# Patient Record
Sex: Female | Born: 1970 | Race: White | Hispanic: No | Smoking: Former smoker
Health system: Southern US, Community
[De-identification: ages and names within clinical notes are randomized; demographics above are authoritative.]

## PROBLEM LIST (undated history)

## (undated) DIAGNOSIS — G709 Myoneural disorder, unspecified: Secondary | ICD-10-CM

## (undated) DIAGNOSIS — F329 Major depressive disorder, single episode, unspecified: Secondary | ICD-10-CM

## (undated) DIAGNOSIS — F419 Anxiety disorder, unspecified: Secondary | ICD-10-CM

## (undated) DIAGNOSIS — Z889 Allergy status to unspecified drugs, medicaments and biological substances status: Secondary | ICD-10-CM

## (undated) DIAGNOSIS — M790 Rheumatism, unspecified: Secondary | ICD-10-CM

## (undated) DIAGNOSIS — T8859XA Other complications of anesthesia, initial encounter: Secondary | ICD-10-CM

## (undated) DIAGNOSIS — F32A Depression, unspecified: Secondary | ICD-10-CM

## (undated) DIAGNOSIS — T4145XA Adverse effect of unspecified anesthetic, initial encounter: Secondary | ICD-10-CM

## (undated) DIAGNOSIS — J45909 Unspecified asthma, uncomplicated: Secondary | ICD-10-CM

## (undated) DIAGNOSIS — M1990 Unspecified osteoarthritis, unspecified site: Secondary | ICD-10-CM

## (undated) DIAGNOSIS — S32009A Unspecified fracture of unspecified lumbar vertebra, initial encounter for closed fracture: Secondary | ICD-10-CM

## (undated) DIAGNOSIS — G43909 Migraine, unspecified, not intractable, without status migrainosus: Secondary | ICD-10-CM

## (undated) DIAGNOSIS — G35 Multiple sclerosis: Secondary | ICD-10-CM

## (undated) DIAGNOSIS — N838 Other noninflammatory disorders of ovary, fallopian tube and broad ligament: Secondary | ICD-10-CM

## (undated) DIAGNOSIS — K529 Noninfective gastroenteritis and colitis, unspecified: Secondary | ICD-10-CM

## (undated) DIAGNOSIS — G8929 Other chronic pain: Secondary | ICD-10-CM

## (undated) DIAGNOSIS — M797 Fibromyalgia: Secondary | ICD-10-CM

## (undated) DIAGNOSIS — B999 Unspecified infectious disease: Secondary | ICD-10-CM

## (undated) DIAGNOSIS — E039 Hypothyroidism, unspecified: Secondary | ICD-10-CM

## (undated) HISTORY — PX: DILATION AND CURETTAGE OF UTERUS: SHX78

## (undated) HISTORY — DX: Unspecified infectious disease: B99.9

## (undated) HISTORY — PX: BUNIONECTOMY: SHX129

## (undated) HISTORY — PX: OVARY SURGERY: SHX727

## (undated) HISTORY — PX: FOOT NEUROMA SURGERY: SHX646

## (undated) HISTORY — DX: Unspecified fracture of unspecified lumbar vertebra, initial encounter for closed fracture: S32.009A

## (undated) HISTORY — PX: TONSILLECTOMY: SUR1361

## (undated) HISTORY — DX: Major depressive disorder, single episode, unspecified: F32.9

## (undated) HISTORY — DX: Noninfective gastroenteritis and colitis, unspecified: K52.9

## (undated) HISTORY — DX: Anxiety disorder, unspecified: F41.9

## (undated) HISTORY — DX: Multiple sclerosis: G35

## (undated) HISTORY — DX: Migraine, unspecified, not intractable, without status migrainosus: G43.909

## (undated) HISTORY — PX: ELBOW ARTHROSCOPY: SUR87

## (undated) HISTORY — PX: MYOMECTOMY ABDOMINAL APPROACH: SUR870

## (undated) HISTORY — DX: Depression, unspecified: F32.A

---

## 1998-05-14 HISTORY — PX: ROTATOR CUFF REPAIR: SHX139

## 1999-06-05 ENCOUNTER — Other Ambulatory Visit: Admission: RE | Admit: 1999-06-05 | Discharge: 1999-06-05 | Payer: Self-pay | Admitting: Orthopedic Surgery

## 2003-06-18 ENCOUNTER — Encounter: Admission: RE | Admit: 2003-06-18 | Discharge: 2003-06-18 | Payer: Self-pay | Admitting: Obstetrics and Gynecology

## 2003-09-24 ENCOUNTER — Inpatient Hospital Stay (HOSPITAL_COMMUNITY): Admission: AD | Admit: 2003-09-24 | Discharge: 2003-09-24 | Payer: Self-pay | Admitting: Obstetrics and Gynecology

## 2003-10-17 ENCOUNTER — Ambulatory Visit (HOSPITAL_COMMUNITY): Admission: RE | Admit: 2003-10-17 | Discharge: 2003-10-17 | Payer: Self-pay | Admitting: Sports Medicine

## 2003-11-08 ENCOUNTER — Encounter: Admission: RE | Admit: 2003-11-08 | Discharge: 2003-11-08 | Payer: Self-pay | Admitting: Sports Medicine

## 2003-12-06 ENCOUNTER — Encounter (INDEPENDENT_AMBULATORY_CARE_PROVIDER_SITE_OTHER): Payer: Self-pay | Admitting: Specialist

## 2003-12-06 ENCOUNTER — Ambulatory Visit (HOSPITAL_COMMUNITY): Admission: RE | Admit: 2003-12-06 | Discharge: 2003-12-06 | Payer: Self-pay | Admitting: Obstetrics and Gynecology

## 2003-12-10 ENCOUNTER — Inpatient Hospital Stay (HOSPITAL_COMMUNITY): Admission: AD | Admit: 2003-12-10 | Discharge: 2003-12-13 | Payer: Self-pay | Admitting: Obstetrics and Gynecology

## 2004-01-18 ENCOUNTER — Encounter: Admission: RE | Admit: 2004-01-18 | Discharge: 2004-01-18 | Payer: Self-pay | Admitting: *Deleted

## 2004-05-14 HISTORY — PX: WRIST SURGERY: SHX841

## 2006-06-06 ENCOUNTER — Other Ambulatory Visit: Admission: RE | Admit: 2006-06-06 | Discharge: 2006-06-06 | Payer: Self-pay | Admitting: *Deleted

## 2006-08-14 ENCOUNTER — Ambulatory Visit (HOSPITAL_BASED_OUTPATIENT_CLINIC_OR_DEPARTMENT_OTHER): Admission: RE | Admit: 2006-08-14 | Discharge: 2006-08-14 | Payer: Self-pay | Admitting: Orthopedic Surgery

## 2006-12-04 ENCOUNTER — Ambulatory Visit (HOSPITAL_COMMUNITY): Admission: RE | Admit: 2006-12-04 | Discharge: 2006-12-04 | Payer: Self-pay | Admitting: *Deleted

## 2007-01-02 ENCOUNTER — Ambulatory Visit (HOSPITAL_COMMUNITY): Admission: RE | Admit: 2007-01-02 | Discharge: 2007-01-03 | Payer: Self-pay | Admitting: *Deleted

## 2007-01-02 ENCOUNTER — Encounter (INDEPENDENT_AMBULATORY_CARE_PROVIDER_SITE_OTHER): Payer: Self-pay | Admitting: *Deleted

## 2007-01-11 ENCOUNTER — Observation Stay (HOSPITAL_COMMUNITY): Admission: AD | Admit: 2007-01-11 | Discharge: 2007-01-12 | Payer: Self-pay | Admitting: *Deleted

## 2007-01-13 ENCOUNTER — Inpatient Hospital Stay (HOSPITAL_COMMUNITY): Admission: AD | Admit: 2007-01-13 | Discharge: 2007-01-15 | Payer: Self-pay | Admitting: Family Medicine

## 2007-08-06 ENCOUNTER — Ambulatory Visit (HOSPITAL_BASED_OUTPATIENT_CLINIC_OR_DEPARTMENT_OTHER): Admission: RE | Admit: 2007-08-06 | Discharge: 2007-08-06 | Payer: Self-pay | Admitting: Orthopedic Surgery

## 2007-12-31 ENCOUNTER — Other Ambulatory Visit: Admission: RE | Admit: 2007-12-31 | Discharge: 2007-12-31 | Payer: Self-pay | Admitting: Gynecology

## 2008-02-27 ENCOUNTER — Encounter: Admission: RE | Admit: 2008-02-27 | Discharge: 2008-02-27 | Payer: Self-pay | Admitting: Sports Medicine

## 2009-02-28 ENCOUNTER — Encounter: Admission: RE | Admit: 2009-02-28 | Discharge: 2009-04-20 | Payer: Self-pay | Admitting: Diagnostic Neuroimaging

## 2009-04-12 ENCOUNTER — Encounter: Admission: RE | Admit: 2009-04-12 | Discharge: 2009-04-12 | Payer: Self-pay | Admitting: Sports Medicine

## 2010-05-14 HISTORY — PX: COLONOSCOPY W/ POLYPECTOMY: SHX1380

## 2010-09-26 NOTE — Op Note (Signed)
NAMESACOYA, MCGOURTY               ACCOUNT NO.:  [id number]   MEDICAL RECORD NO.:  [id number]          PATIENT TYPE:  AMB   LOCATION:  DSC                          FACILITY:  [facility]   PHYSICIAN:  Cindee Salt, [name]       DATE OF BIRTH:  [birth year]   DATE OF PROCEDURE:  08/06/2007  DATE OF DISCHARGE:                               OPERATIVE REPORT   PREOPERATIVE DIAGNOSIS:  Status post screw fixation AccuTrak standard  size, right scaphoid.   POSTOPERATIVE DIAGNOSIS:  Status post screw fixation AccuTrak standard  size, right scaphoid.   OPERATION:  Removal AccuTrak screw with placement of an infused bone  graft, right scaphoid.   SURGEON:  Cindee Salt, [name]   ASSISTANT:  Carolyne Fiscal R.N.   ANESTHESIA:  General.   ANESTHESIOLOGIST:  Sheldon Silvan, [name]   HISTORY:  The patient is a 40 year old female status post open reduction  and internal fixation of a scaphoid fracture.  She has had continued  pain primarily on the volar aspect.  CT scan reveals that the screw  appears to be slightly proud. She is admitted for removal and bone  grafting.  She is aware of the risks and complications including  infection, recurrence, injury to arteries, nerves, and tendons,  incomplete relief of symptoms, dystrophy, refracture of the fracture and  scaphoid.  In the preoperative area, the patient is seen, questions  encouraged and answered, the extremity marked by both the patient and  surgeon, antibiotic given.   PROCEDURE:  The patient is brought to the operating room where a general  anesthetic was carried out without difficulty using LMA.  She was  prepped using DuraPrep, supine position, right arm free.  After a time  out, the limb was exsanguinated with an Esmarch bandage.  A tourniquet  placed high on the arm was inflated to 250 mmHg. No latex was used due  to a latex allergy. A longitudinal incision was made curved slightly  radial at the level of the distal pole of the scaphoid, carried down  through subcutaneous tissue.  Bleeders were electrocauterized. The  dissection was carefully carried down to the tip of the scaphoid after  localization of the screw with OEC image intensification. The capsule of  the STT joint was opened with sharp dissection.  The screw was  immediately apparent.  This was proud by 2-3 threads.  The area was  minimally debrided.  A standard size screwdriver was then inserted and  the screw easily removed.  The area was then irrigated.  This was probed  with a House curette. Removal of the screw and placement of the House  curette was confirmed with the OEC.  1.7 mL Infuse bone graft was then  mixed.  This was allowed to sit for 15 minutes.  This was then cut into  two segments.  These were each rolled and, after irrigation of the  extract screw hole, these were inserted entirely filling the cavity of  the extract screw in the scaphoid.  A 25 gauge needle was then inserted  down the shaft of the scaphoid.  Its  position confirmed that the bone  graft was actually down the center of the scaphoid.  This was done in AP  and lateral directions using the [facility] image intensifier.  The capsule was  then closed with figure-of-eight 4-0 Vicryl sutures, the subcutaneous  tissues with interrupted 4-0 Vicryl, and the skin with interrupted 4-0  Vicryl Rapide sutures.  A sterile compressive dressing and  thumb spica splint was applied.  The patient tolerated the procedure  well and was taken to the recovery room for observation in satisfactory  condition.  She will be discharged home to return to the [facility] of  [place] in one week on Vicodin.           ______________________________  Cindee Salt, [name]     GK/MEDQ  D:  08/06/2007  T:  08/06/2007  Job:  [id number]   cc:   Loreta Ave, [name]

## 2010-09-26 NOTE — Discharge Summary (Signed)
[name], ALKINS               ACCOUNT NO.:  [id number]   MEDICAL RECORD NO.:  [id number]          PATIENT TYPE:  INP   LOCATION:  9305                          [facility]   PHYSICIAN:  Almedia Balls. Fore, [name]   DATE OF BIRTH:  01-May-1971   DATE OF ADMISSION:  01/13/2007  DATE OF DISCHARGE:  01/15/2007                               DISCHARGE SUMMARY   HISTORY:  The patient is a 40 year old who had undergone abdominal  myomectomy on January 02, 2007.  She was readmitted because of persistent  refractory nausea and vomiting and pain on January 13, 2007 by Dr.  Artist Pais.   LABORATORY DATA INCLUDE:  Admission CBC with hemoglobin 13.4 grams,  13,600 white blood cells with left shift.  BMET panel was within normal  limits.  Subsequent CBC was done on January 14, 2007 with hemoglobin  11.6 grams, 9500 white blood cells with normal differential.  Urinalysis  showed greater than 80 mg/dL of ketones but otherwise was negative; and  urine culture was negative   HOSPITAL COURSE:  The patient was admitted and made n.p.o. and placed on  IV fluids.  She underwent several radiologic studies including flat and  upright abdominal films, showing no bowel obstruction, renal tract  ultrasound showing normal sonographic appearance of the kidneys with no  evidence of hydronephrosis and a 5 cm cyst noted in the right hepatic  lobe.  CT of the abdomen with contrast was also done which showed a  stable-to-slight increase in size of a simple appearing cystic mass in  the lower right lobe liver.  Stable bilateral renal cysts, and 3  punctate, left, renal stones.  There was also in the pelvis a defect in  the anterior surface of the uterus corresponding to the myomectomy  incision with a question of dehiscence in this area.  Further evaluation  by several radiologists revealed that this was probable blood and  healing tissue in this area.  There was a 5.6 complex cystic ovarian  mass on the left,  felt to be a corpus luteum.  She had also a linear  density along the ventral surface of the cervix which was compatible  with a stone and a known ureteral diverticulum.   HOSPITAL COURSE:  The patient was admitted on September 1 and placed on  IV fluids as noted above.  An NG-tube was also placed later that day on  September 1, because of persistent nausea and vomiting.  This was  clamped on September 2; and the patient was observed with p.o. intake.  The NG-tube was discontinued on the morning of September 3 with full  liquids being administered without nausea or vomiting.  On the evening  of September 3, the patient was afebrile and experiencing only pain; and  was without nausea and vomiting.  It was felt that she could be  discharged at this time.   FINAL DIAGNOSES:  1. Status post myomectomy.  2. Persisting refractory nausea and vomiting and abdominal pain.   OPERATIONS:  None.   PATHOLOGY REPORT:  None.   DISPOSITION:  Discharged home  to return to the office in approximately 2  weeks for followup.  She was instructed to gradually progress her  activities over several weeks at home; and to limit lifting and driving  for several more days.  She was to progress her diet, slowly; and is to  call if unusual pain, nausea, vomiting, or if fever should ensue.  She was fully ambulatory, on a full liquid-to-regular diet and in good  condition at the time of discharge.  She was given prescriptions for  Dilaudid 2 mg #30 to be taken one-half-to-one  q.6 h. p.r.n. pain and  Reglan, generic, 10 mg #30 to be taken one t.i.d.           ______________________________  Almedia Balls. [name] Patient, [name]     SRF/MEDQ  D:  01/15/2007  T:  01/15/2007  Job:  [id number]

## 2010-09-26 NOTE — Op Note (Signed)
[name], [name]               ACCOUNT NO.:  [id number]   MEDICAL RECORD NO.:  [id number]          PATIENT TYPE:  OIB   LOCATION:  9312                          [facility]   PHYSICIAN:  Almedia Balls. Fore, [name]   DATE OF BIRTH:  1970-12-15   DATE OF PROCEDURE:  01/02/2007  DATE OF DISCHARGE:  01/03/2007                               OPERATIVE REPORT   NOTE:  This had been dictated on 01/02/07 but I found that it did not  get recorded properly so I am redictating.   PREOPERATIVE DIAGNOSES:  Abnormal uterine bleeding, myometrial and  intracavitary myomata.   POSTOPERATIVE DIAGNOSIS:  Abnormal uterine bleeding, myometrial and  intracavitary myomata, pending pathology.   OPERATION PERFORMED:  Abdominal myomectomy and lysis of adhesions.   ANESTHESIA:  General orotracheal.   OPERATOR:  [name], [name]   FIRST ASSISTANT:  Beather Arbour, [name]   INDICATION FOR SURGERY:  The patient is a 40 year old female who is  status post a prior hysteroscopic attempt at myomectomy by another  physician which resulted in regrowth of the myoma in an intracavitary  position, also submucosal and subserosal myomata present with abnormal  uterine bleeding and pelvic pain, status post right salpingo-  oophorectomy.   The patient has been apprised of the need for surgery to treat these  problems and the type of surgery to be performed to include risks of  anesthesia, injury to uterus, tubes, ovaries, bowel, bladder, blood  vessels, ureters, postoperative hemorrhage, infection, and recuperation.  She fully understands all of these considerations and has signed  informed consent to proceed on 01/02/07.   OPERATIVE FINDINGS:  On entry into the abdomen, the uterus was enlarged  to approximately ten weeks gestational size.  There were several myomata  present within the myometrial area and another myoma in the endometrial  cavity.  Exploration of the upper abdomen revealed the lower liver edge  area, the gallbladder, spleen, periaortic areas, kidneys, and appendix  to be normal to visualization and/or palpation.  In the pelvis further  examination revealed absence of the right tube and ovary from previous  surgery.  The left tube and ovary were adherent to each other and to the  lateral peritoneal surface.   DESCRIPTION OF PROCEDURE:  With the patient under general anesthesia,  prepared and draped in the usual sterile fashion, with a Foley catheter  in the bladder, and a catheter having been placed in the cervix for  injection of Indigo Carmine, a lower abdominal transverse incision was  made and was carried into the peritoneal cavity without difficulty.  The  vagina had been packed so that the uterus was elevated somewhat but a  self-retaining retractor was placed, and the bowel was packed off and  packs were placed in the posterior cul-de-sac for further elevation of  the uterus.  The anterior serosa of the uterus was then incised and  taken down with the bladder as a lower fundal incision would be  necessary for entrance into the endometrial cavity to remove this  fibroid as well as to remove the myometrial fibroids.  This was  accomplished with numerous fibroids  removed.  The defects in the  myometrium were reapproximated and rendered hemostatic with imbricating  sutures of zero Vicryl.  The upper layer of the uterus was  reapproximated with imbricating sutures of 2-0 PDS.  The incision in the  serosa was then reapproximated with continuous suture of 2-0 PDS.   The adhesions involving the left tube and ovary were then lysed using  sharp dissection and Bovie dissection as well with freeing of the left  fimbria from the ovary and from the posterolateral peritoneum.  This  allowed for better elevation of the adnexa out of the posterior cul-de-  sac.  The area was lavaged with copious amounts of lactated Ringer  solution, and after noting that hemostasis was maintained and  that  sponge and instrument counts were correct, the peritoneum was closed  with a continuous suture of zero Vicryl.  The fascia was closed with two  sutures of zero PDS which were brought from the lateral aspects of the  incision and tied in the midline.  The subcutaneous fat was  reapproximated with interrupted horizontal mattress sutures of zero  Vicryl.  The skin was closed with a subcuticular suture of 3-0 plain  catgut.   The patient was then examined vaginally, and because of the inability to  totally render the intracavitary area hemostatic, the pediatric catheter  was inserted fully into the uterine cavity with the balloon inflated  with approximately 2 ml of saline.  This was to provide tamponade of  small vessels in this area and to allow healing around the balloon.   At this point with good hemostasis throughout and correct sponge and  instrument count, the procedure was terminated.  Estimated blood loss  was 300 ml.  The patient was taken to the recovery room in good  condition with clear urine in the Foley catheter tubing.  She will be  placed on 23 hour observation following surgery.           ______________________________  Almedia Balls [name], [name]     SRF/MEDQ  D:  01/14/2007  T:  01/14/2007  Job:  [id number]   cc:   Almedia Balls. [name], [name]  Fax: [phone]   Gretta Cool, [name]  Fax: [phone]

## 2010-09-26 NOTE — H&P (Signed)
[name], [name]               ACCOUNT NO.:  [id number]   MEDICAL RECORD NO.:  [id number]          PATIENT TYPE:  AMB   LOCATION:  SDC                           [facility]   PHYSICIAN:  Almedia Balls. Fore, [name]   DATE OF BIRTH:  [birth year]   DATE OF ADMISSION:  01/02/2007  DATE OF DISCHARGE:                              HISTORY & PHYSICAL   HISTORY OF PRESENT ILLNESS:  The patient is a 40 year old gravida 1,  para 0 whose last menstrual period was December 23, 2006.  She has  undergone in the past laparoscopy in 1985 with repeat removal of right  ovary and tube.  She was also operated on in the summer of 2005 at which  time a hysteroscopy was done with removal of possible fibroid from her  uterus as well as a D&C.  She had a stormy postoperative course  following this in that she was in extreme pain and temperature elevation  over several weeks.  This eventually resolved.  She has had increasing  pain over the past several months and increasingly severe menses in  terms of flow.  Hysterogram was performed in late July 2008 with finding  of irregular mass within the endometrial cavity which represented a  probable submucosal fibroid.  She is admitted at this time for abdominal  myomectomy and evaluation of tube and ovary otherwise.  She has been  fully counseled as to the nature of the procedure and the risks involved  including risks of anesthesia, injury to uterus, tubes, ovaries, bowel,  bladder, blood vessels, ureters, postoperative hemorrhage, infection,  recuperation.  She fully understands all these considerations and wishes  to proceed on 02 January 2007.  Pap smear was normal in January 2008.   PAST MEDICAL HISTORY:  Includes laparoscopy with findings of adhesions  and a D&C in several times during the period of 1985, 1989, right  oophorectomy in 1988. She has had several surgeries on her feet right  and left and right shoulder surgery in 2003. She underwent a  hysteroscopy  D&C and removal of myoma in July 2005 and had repeat  hysteroscopy D&C in September 2005 for evaluation of this area.  She has  recently had extreme pain as noted with periods and has been taking  Hydrocodone and oxycodone for menstrual pain.  She is allergic to no  medications.  She is a nonsmoker at this point although she has smoked  in the past.   FAMILY HISTORY:  Includes mother with cardiovascular disease.  Father  with cancer of unknown type.   REVIEW OF SYSTEMS:  HEENT: Headaches mainly with menses.  CARDIORESPIRATORY: Negative.  GASTROINTESTINAL:  Negative.  GENITOURINARY:  As in present illness.  NEUROMUSCULAR:  Negative.   PHYSICAL EXAMINATION:  VITAL SIGNS:  Height 5 feet 7 inches, weight 147  pounds, blood pressure 122/68, pulse 80, respirations 18.  GENERAL:  Well-developed white female in no acute distress.  HEENT: Within normal limits.  NECK: Supple without masses, adenopathy or bruits.  Heart: Regular rate and rhythm without murmurs.  LUNGS: Clear to P&A.  BREASTS:  Examining examined sitting and lying without mass.  Axilla  negative.  ABDOMEN:  Flat and soft without mass, nontender.  PELVIC:  External genitalia, Bartholin's, urethra and Skene's glands  within normal limits.  Vagina is normal.  Cervix is slightly inflamed.  Uterus is mid posterior approximately 8-[redacted] weeks gestational size,  somewhat irregular, slightly tender on palpation.  Adnexa reveal no  palpable mass. Anterior posterior cul-de-sac exam is confirmatory.  EXTREMITIES:  Within normal limits.  CENTRAL NERVOUS SYSTEM: Grossly intact.  SKIN: Normal except for multiple tattoos over her back, right arm, right  hip, right lower quadrant.   IMPRESSION:  Leiomyomata uteri enlarging, pelvic pain, abnormal uterine  bleeding secondary to above.   DISPOSITION:  As noted above.           ______________________________  Almedia Balls. [name], [name]     SRF/MEDQ  D:  12/25/2006  T:  12/26/2006  Job:   [id number]

## 2010-09-26 NOTE — Op Note (Signed)
[name], [name]               ACCOUNT NO.:  [id number]   MEDICAL RECORD NO.:  [id number]          PATIENT TYPE:  OIB   LOCATION:  9312                          [facility]   PHYSICIAN:  Almedia Balls. Fore, [name]   DATE OF BIRTH:  June 27, 1970   DATE OF PROCEDURE:  01/02/2007  DATE OF DISCHARGE:                               OPERATIVE REPORT   PREOPERATIVE DIAGNOSIS:  Leiomyomata uteri, uterine enlargement,  abnormal uterine bleeding, pelvic pain.   POSTOPERATIVE DIAGNOSIS:  Leiomyomata uteri, uterine enlargement,  abnormal uterine bleeding, pelvic pain.  Pending pathology.  Plus  ovarian and tubal adhesions - left.   OPERATION:  Abdominal myomectomy, ovarian and salpingolysis - left,  revision of keloid.   ANESTHESIA:  General orotracheal.   OPERATOR:  Almedia Balls. [name], [name]   FIRST ASSISTANT:  Dr. Nicholas Lose.   INDICATIONS FOR SURGERY:  The patient is a 40 year old with the above-  noted problems who was counseled as to the need for surgery to treat  these problems.  She had an intracavitary myoma which was the probable  cause for pain and abnormal bleeding.  She is fully counseled as to the  nature of the procedure necessary to correct these problems and the  risks involved including risks of anesthesia, injury to uterus, tubes,  ovaries, bowel, bladder blood vessels, ureters, postoperative  hemorrhage, infection, recuperation.  She fully understands all these  considerations and has signed informed consent to proceed on 02 January 2007.   OPERATIVE FINDINGS:  On examination of the intrauterine cavity, there  was noted to be a large myomata present.  On entry into the abdomen, the  uterus was noted to be approximately [redacted] weeks gestational size with  multiple myometrial myomata and distortion of the uterus itself.  The  right tube and ovary were previously surgically excised.  Exploration of  the upper abdomen revealed the lower liver edge, gallbladder, spleen,  kidneys,  periaortic areas and appendix area to be normal to  visualization and/or palpation.  The left tube and ovary were adherent  to the posterolateral peritoneal surface and to each other with fairly  dense adhesions.   NO FURTHER DICTATION           ______________________________  Almedia Balls. [name], [name]     SRF/MEDQ  D:  01/02/2007  T:  01/02/2007  Job:  [id number]   cc:   Gretta Cool, [name]  Fax: [phone]

## 2010-09-26 NOTE — Discharge Summary (Signed)
[name], [name]               ACCOUNT NO.:  [id number]   MEDICAL RECORD NO.:  [id number]          PATIENT TYPE:  OIB   LOCATION:  9312                          [facility]   PHYSICIAN:  Almedia Balls. Fore, [name]   DATE OF BIRTH:  11-May-1971   DATE OF ADMISSION:  01/02/2007  DATE OF DISCHARGE:  01/03/2007                               DISCHARGE SUMMARY   DATE OF ADMISSION:  January 02, 2007 at [facility].   DATE OF DISCHARGE:  January 03, 2007.   HISTORY:  The patient is 40 year old with abnormal uterine bleeding,  pelvic pain, uterine enlargement, probable myomata uteri, status post  right salpingo-oophorectomy for myomectomy revision of keloid formation  and indicated procedures on 02 January 2007.  The remainder of her  history and physical are as previously dictated.   Laboratory data include preoperative hemoglobin and 15.6 grams.   HOSPITAL COURSE:  The patient was taken to the operating room on 02 January 2007 at which time abdominal myomectomy, left salpingo-ovarian  lysis, chromopertubation and revision of keloid were performed.  The  patient did well postoperatively.  Diet and ambulation were progressed  over the evening of 21 August and early morning of 22 August.  On the  morning of 22 August, she was afebrile and experiencing no problems  except for pain which was controlled by analgesics.  It was felt that  she could be discharged at this time.   FINAL DIAGNOSES:  Leiomyomata uteri, abnormal uterine bleeding, pelvic  pain.   OPERATION PERFORMED:  Abdominal myomectomy salpingo-ovario-lysis - left,  chromopertubation revision of keloid pathology report unavailable at the  time of dictation.   DISPOSITION:  Discharged home to return the office in 2 weeks for follow-  up.  She was instructed to gradually progress her activities over  several weeks at home and to limit lifting and driving for 2 weeks.  She  was fully ambulatory, on a regular diet, and in good  condition at the  time of discharge.  She was given prescriptions for oxycodone/APap  10/325, #30 to be taken when q.6 h p.r.n. pain and doxycycline 100 mg  #12 to be taken one b.i.d.           ______________________________  Almedia Balls. [name], [name]     SRF/MEDQ  D:  01/03/2007  T:  01/05/2007  Job:  [id number]

## 2011-02-05 LAB — POCT HEMOGLOBIN-HEMACUE: Hemoglobin: 14.2

## 2011-02-23 LAB — COMPREHENSIVE METABOLIC PANEL
ALT: 13
AST: 20
Albumin: 4.1
BUN: 14
BUN: 18
BUN: 21
CO2: 21
CO2: 21
Calcium: 9.6
Calcium: 10.2
Calcium: 9.2
Chloride: 108
Chloride: 109
Creatinine, Ser: 0.64
Creatinine, Ser: 0.72
GFR calc Af Amer: >60
GFR calc non Af Amer: >60
GFR calc non Af Amer: >60
Glucose, Bld: 117 — ABNORMAL HIGH
Glucose, Bld: 142 — ABNORMAL HIGH
Potassium: 4.2
Sodium: 141
Total Bilirubin: 0.6
Total Bilirubin: 0.7
Total Protein: 7.2

## 2011-02-23 LAB — CBC
HCT: 33.3 — ABNORMAL LOW
HCT: 35.7 — ABNORMAL LOW
HCT: 41.3
HCT: 44.8
Hemoglobin: 11.6 — ABNORMAL LOW
Hemoglobin: 13.4
Hemoglobin: 15.6 — ABNORMAL HIGH
MCHC: 34.3
MCHC: 34.6
MCHC: 34.7
MCV: 100.4 — ABNORMAL HIGH
MCV: 99.3
MCV: 99.3
Platelets: 206
Platelets: 238
Platelets: 206
Platelets: 213
RBC: 3.6 — ABNORMAL LOW
RBC: 4.12
RDW: 12.3
WBC: 9.5
WBC: 11.5 — ABNORMAL HIGH
WBC: 14.7 — ABNORMAL HIGH
WBC: 7.2

## 2011-02-23 LAB — URINALYSIS, ROUTINE W REFLEX MICROSCOPIC
Glucose, UA: NEGATIVE
Ketones, ur: >80 — AB
Ketones, ur: >80 — AB
Leukocytes, UA: NEGATIVE
Nitrite: NEGATIVE
Nitrite: NEGATIVE
Urobilinogen, UA: 0.2
pH: 5.5

## 2011-02-23 LAB — DIFFERENTIAL
Basophils Absolute: 0
Basophils Absolute: 0
Basophils Relative: 0
Eosinophils Absolute: 0.1
Eosinophils Relative: 0
Eosinophils Relative: 3
Eosinophils Relative: 0
Lymphocytes Relative: 29
Lymphocytes Relative: 4 — ABNORMAL LOW
Lymphs Abs: 2.8
Lymphs Abs: 0.5 — ABNORMAL LOW
Monocytes Absolute: 0.5
Monocytes Absolute: 0.9 — ABNORMAL HIGH
Neutro Abs: 5.5
Neutro Abs: 13.8 — ABNORMAL HIGH
Neutrophils Relative %: 94 — ABNORMAL HIGH

## 2011-02-23 LAB — URINE MICROSCOPIC-ADD ON

## 2011-02-23 LAB — URINE CULTURE: Culture: NO GROWTH

## 2011-05-02 ENCOUNTER — Other Ambulatory Visit: Payer: Self-pay | Admitting: Sports Medicine

## 2011-05-02 DIAGNOSIS — M25521 Pain in right elbow: Secondary | ICD-10-CM

## 2011-05-02 DIAGNOSIS — M545 Low back pain: Secondary | ICD-10-CM

## 2011-05-09 ENCOUNTER — Ambulatory Visit
Admission: RE | Admit: 2011-05-09 | Discharge: 2011-05-09 | Disposition: A | Discharge status: Home / Self Care | Payer: 59 | Source: Ambulatory Visit | Attending: Sports Medicine | Admitting: Sports Medicine

## 2011-05-09 DIAGNOSIS — M25521 Pain in right elbow: Secondary | ICD-10-CM

## 2011-05-09 DIAGNOSIS — M545 Low back pain: Secondary | ICD-10-CM

## 2012-08-13 ENCOUNTER — Telehealth: Payer: Self-pay

## 2012-08-13 MED ORDER — INTERFERON BETA-1A 30 MCG/0.5ML IM KIT: 30.0000 ug | PACK | INTRAMUSCULAR | Status: DC

## 2012-08-13 NOTE — Telephone Encounter (Signed)
Patient called and left message saying she has an appt in May, but will run out of Avonex before then, so needs to get a refill.  Rx has been sent.

## 2012-09-11 HISTORY — PX: ELBOW SURGERY: SHX618

## 2012-09-16 ENCOUNTER — Ambulatory Visit: Payer: Self-pay | Admitting: Diagnostic Neuroimaging

## 2012-09-17 ENCOUNTER — Ambulatory Visit (INDEPENDENT_AMBULATORY_CARE_PROVIDER_SITE_OTHER): Payer: 59 | Admitting: Diagnostic Neuroimaging

## 2012-09-17 ENCOUNTER — Telehealth: Payer: Self-pay | Admitting: Diagnostic Neuroimaging

## 2012-09-17 ENCOUNTER — Other Ambulatory Visit: Payer: Self-pay

## 2012-09-17 ENCOUNTER — Encounter: Payer: Self-pay | Admitting: Diagnostic Neuroimaging

## 2012-09-17 VITALS — BP 138/96 | HR 81

## 2012-09-17 DIAGNOSIS — G35D Multiple sclerosis, unspecified: Secondary | ICD-10-CM | POA: Insufficient documentation

## 2012-09-17 DIAGNOSIS — G35 Multiple sclerosis: Secondary | ICD-10-CM

## 2012-09-17 MED ORDER — INTERFERON BETA-1A 30 MCG/0.5ML IM KIT: 30.0000 ug | PACK | INTRAMUSCULAR | Status: DC
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
## 2012-09-17 NOTE — Patient Instructions (Addendum)
I will check MRI (brain, cervical and thoracic).  I will check JC virus antibody test.  Continue avonex. We will consider Tysabri after above testing.

## 2012-09-29 ENCOUNTER — Ambulatory Visit
Admission: RE | Admit: 2012-09-29 | Discharge: 2012-09-29 | Disposition: A | Discharge status: Home / Self Care | Payer: 59 | Source: Ambulatory Visit | Attending: Diagnostic Neuroimaging | Admitting: Diagnostic Neuroimaging

## 2012-09-29 DIAGNOSIS — G35 Multiple sclerosis: Secondary | ICD-10-CM

## 2012-09-29 DIAGNOSIS — G35D Multiple sclerosis, unspecified: Secondary | ICD-10-CM

## 2012-09-29 MED ORDER — GADOBENATE DIMEGLUMINE 529 MG/ML IV SOLN
12.0000 mL | Freq: Once | INTRAVENOUS | Status: AC | PRN
  Administered 2012-09-29: 12 mL via INTRAVENOUS

## 2012-10-08 ENCOUNTER — Telehealth: Payer: Self-pay | Admitting: Diagnostic Neuroimaging

## 2012-10-08 NOTE — Telephone Encounter (Signed)
No answer

## 2012-10-09 ENCOUNTER — Encounter: Payer: Self-pay | Admitting: Diagnostic Neuroimaging

## 2012-10-09 NOTE — Telephone Encounter (Signed)
I called and gave her the results MRI's and also JCV negative.  She takes her avonex injections every Monday, relayed also that she has had some falls this week.  I would let Dr. Marjory Lies know about this and see where he wants to go from here.  I will call her home # tomorrow.

## 2012-10-09 NOTE — Telephone Encounter (Signed)
LMVM for pt to return call for MRI results and JCV.

## 2012-10-10 ENCOUNTER — Telehealth: Payer: Self-pay | Admitting: *Deleted

## 2012-10-10 NOTE — Telephone Encounter (Signed)
Message copied by Ardeth Sportsman on Fri Oct 10, 2012  8:59 AM ------      Message from: Joycelyn Schmid      Created: Thu Oct 09, 2012  5:15 PM       Call pt with stable MRI brain results. Chronic MS. No active or new plaques. Cervical and thoracic spine are normal.             -VRP            ----- Message -----         From: Suanne Marker, MD         Sent: 10/03/2012   4:38 PM           To: Suanne Marker, MD                   ------

## 2012-10-10 NOTE — Telephone Encounter (Signed)
Spoke to the patient and she is aware of MRI results.

## 2012-10-13 NOTE — Telephone Encounter (Signed)
Please start process to transition to tysabri. -VRP

## 2012-10-15 NOTE — Telephone Encounter (Signed)
LMVM for pt on home # that Dr. Marjory Lies stated can transition to tysabri.  She can come by and p/u enrollment form, it can be mailed or faxed.  Call back and let me know.

## 2012-10-24 ENCOUNTER — Encounter (HOSPITAL_BASED_OUTPATIENT_CLINIC_OR_DEPARTMENT_OTHER): Payer: Self-pay | Admitting: *Deleted

## 2012-10-24 ENCOUNTER — Other Ambulatory Visit: Payer: Self-pay | Admitting: Orthopedic Surgery

## 2012-10-24 NOTE — Progress Notes (Signed)
Pt has MS,chronic pain, allergies, arthritis, ibs-multiple surgeries-take pain meds daily-has a high tolerance to pain meds, and low tol to pain-to take all am meds and bring meds-

## 2012-10-27 ENCOUNTER — Telehealth: Payer: Self-pay | Admitting: *Deleted

## 2012-10-27 NOTE — Telephone Encounter (Signed)
Pt called back about getting information for tysabri to her.  She gave me her fax / home # for [place] to send her the forms.   Will try.  Her brother having surgery today.  I LMVM for her that will try to fax home this afternoon and again tomorrow am.

## 2012-10-28 ENCOUNTER — Ambulatory Visit (HOSPITAL_BASED_OUTPATIENT_CLINIC_OR_DEPARTMENT_OTHER)
Admission: RE | Admit: 2012-10-28 | Discharge: 2012-10-28 | Disposition: A | Discharge status: Home / Self Care | Payer: 59 | Source: Ambulatory Visit | Attending: Orthopedic Surgery | Admitting: Orthopedic Surgery

## 2012-10-28 ENCOUNTER — Encounter (HOSPITAL_BASED_OUTPATIENT_CLINIC_OR_DEPARTMENT_OTHER): Payer: Self-pay | Admitting: Orthopedic Surgery

## 2012-10-28 ENCOUNTER — Encounter (HOSPITAL_BASED_OUTPATIENT_CLINIC_OR_DEPARTMENT_OTHER): Payer: Self-pay | Admitting: Anesthesiology

## 2012-10-28 ENCOUNTER — Ambulatory Visit (HOSPITAL_BASED_OUTPATIENT_CLINIC_OR_DEPARTMENT_OTHER): Payer: 59 | Admitting: Anesthesiology

## 2012-10-28 ENCOUNTER — Encounter (HOSPITAL_BASED_OUTPATIENT_CLINIC_OR_DEPARTMENT_OTHER)
Admission: RE | Disposition: A | Discharge status: Home / Self Care | Payer: Self-pay | Source: Ambulatory Visit | Attending: Orthopedic Surgery

## 2012-10-28 DIAGNOSIS — Z79899 Other long term (current) drug therapy: Secondary | ICD-10-CM | POA: Insufficient documentation

## 2012-10-28 DIAGNOSIS — G479 Sleep disorder, unspecified: Secondary | ICD-10-CM | POA: Insufficient documentation

## 2012-10-28 DIAGNOSIS — J45909 Unspecified asthma, uncomplicated: Secondary | ICD-10-CM | POA: Insufficient documentation

## 2012-10-28 DIAGNOSIS — F329 Major depressive disorder, single episode, unspecified: Secondary | ICD-10-CM | POA: Insufficient documentation

## 2012-10-28 DIAGNOSIS — G709 Myoneural disorder, unspecified: Secondary | ICD-10-CM | POA: Insufficient documentation

## 2012-10-28 DIAGNOSIS — Z91040 Latex allergy status: Secondary | ICD-10-CM | POA: Insufficient documentation

## 2012-10-28 DIAGNOSIS — W11XXXA Fall on and from ladder, initial encounter: Secondary | ICD-10-CM | POA: Insufficient documentation

## 2012-10-28 DIAGNOSIS — Z882 Allergy status to sulfonamides status: Secondary | ICD-10-CM | POA: Insufficient documentation

## 2012-10-28 DIAGNOSIS — S52599A Other fractures of lower end of unspecified radius, initial encounter for closed fracture: Secondary | ICD-10-CM | POA: Insufficient documentation

## 2012-10-28 DIAGNOSIS — F3289 Other specified depressive episodes: Secondary | ICD-10-CM | POA: Insufficient documentation

## 2012-10-28 DIAGNOSIS — G5600 Carpal tunnel syndrome, unspecified upper limb: Secondary | ICD-10-CM | POA: Insufficient documentation

## 2012-10-28 DIAGNOSIS — G35 Multiple sclerosis: Secondary | ICD-10-CM | POA: Insufficient documentation

## 2012-10-28 HISTORY — DX: Other chronic pain: G89.29

## 2012-10-28 HISTORY — DX: Myoneural disorder, unspecified: G70.9

## 2012-10-28 HISTORY — DX: Unspecified osteoarthritis, unspecified site: M19.90

## 2012-10-28 HISTORY — DX: Major depressive disorder, single episode, unspecified: F32.9

## 2012-10-28 HISTORY — DX: Other complications of anesthesia, initial encounter: T88.59XA

## 2012-10-28 HISTORY — PX: OPEN REDUCTION INTERNAL FIXATION (ORIF) DISTAL RADIAL FRACTURE: SHX5989

## 2012-10-28 HISTORY — DX: Allergy status to unspecified drugs, medicaments and biological substances: Z88.9

## 2012-10-28 HISTORY — DX: Unspecified asthma, uncomplicated: J45.909

## 2012-10-28 HISTORY — DX: Rheumatism, unspecified: M79.0

## 2012-10-28 HISTORY — PX: CARPAL TUNNEL RELEASE: SHX101

## 2012-10-28 HISTORY — DX: Adverse effect of unspecified anesthetic, initial encounter: T41.45XA

## 2012-10-28 HISTORY — DX: Depression, unspecified: F32.A

## 2012-10-28 LAB — POCT HEMOGLOBIN-HEMACUE: Hemoglobin: 15.9 g/dL — ABNORMAL HIGH (ref 12.0–15.0)

## 2012-10-28 SURGERY — OPEN REDUCTION INTERNAL FIXATION (ORIF) DISTAL RADIUS FRACTURE
Anesthesia: General | Site: Wrist | Laterality: Left | Wound class: Clean

## 2012-10-28 MED ORDER — MIDAZOLAM HCL 2 MG/2ML IJ SOLN
1.0000 mg | INTRAMUSCULAR | Status: DC | PRN
  Administered 2012-10-28: 1 mg via INTRAVENOUS

## 2012-10-28 MED ORDER — LIDOCAINE HCL (CARDIAC) 20 MG/ML IV SOLN
INTRAVENOUS | Status: DC | PRN
  Administered 2012-10-28: 60 mg via INTRAVENOUS

## 2012-10-28 MED ORDER — VANCOMYCIN HCL IN DEXTROSE 1-5 GM/200ML-% IV SOLN
1000.0000 mg | INTRAVENOUS | Status: AC
  Administered 2012-10-28: 1000 mg via INTRAVENOUS

## 2012-10-28 MED ORDER — OXYCODONE HCL 5 MG PO TABS: 5.0000 mg | ORAL_TABLET | Freq: Once | ORAL | Status: DC | PRN

## 2012-10-28 MED ORDER — LACTATED RINGERS IV SOLN
INTRAVENOUS | Status: DC
  Administered 2012-10-28 (×2): via INTRAVENOUS

## 2012-10-28 MED ORDER — GLYCOPYRROLATE 0.2 MG/ML IJ SOLN
INTRAMUSCULAR | Status: DC | PRN
  Administered 2012-10-28: 0.2 mg via INTRAVENOUS

## 2012-10-28 MED ORDER — FENTANYL CITRATE 0.05 MG/ML IJ SOLN
50.0000 ug | INTRAMUSCULAR | Status: DC | PRN
  Administered 2012-10-28: 100 ug via INTRAVENOUS

## 2012-10-28 MED ORDER — PROPOFOL 10 MG/ML IV BOLUS
INTRAVENOUS | Status: DC | PRN
  Administered 2012-10-28: 150 mg via INTRAVENOUS

## 2012-10-28 MED ORDER — BUPIVACAINE-EPINEPHRINE PF 0.5-1:200000 % IJ SOLN
INTRAMUSCULAR | Status: DC | PRN
  Administered 2012-10-28: 23 mL

## 2012-10-28 MED ORDER — ONDANSETRON HCL 4 MG/2ML IJ SOLN
INTRAMUSCULAR | Status: DC | PRN
  Administered 2012-10-28: 4 mg via INTRAVENOUS

## 2012-10-28 MED ORDER — OXYCODONE-ACETAMINOPHEN 10-325 MG PO TABS: 1.0000 | ORAL_TABLET | ORAL | Status: AC | PRN

## 2012-10-28 MED ORDER — ONDANSETRON HCL 4 MG/2ML IJ SOLN: 4.0000 mg | Freq: Once | INTRAMUSCULAR | Status: DC | PRN

## 2012-10-28 MED ORDER — CHLORHEXIDINE GLUCONATE 4 % EX LIQD: 60.0000 mL | Freq: Once | CUTANEOUS | Status: DC

## 2012-10-28 MED ORDER — OXYCODONE HCL 5 MG/5ML PO SOLN: 5.0000 mg | Freq: Once | ORAL | Status: DC | PRN

## 2012-10-28 MED ORDER — HYDROMORPHONE HCL PF 1 MG/ML IJ SOLN: 0.2500 mg | INTRAMUSCULAR | Status: DC | PRN

## 2012-10-28 SURGICAL SUPPLY — 80 items
BANDAGE GAUZE ELAST BULKY 4 IN (GAUZE/BANDAGES/DRESSINGS) ×2 IMPLANT
BIT DRILL SOLID 2.0X40MM (BIT) IMPLANT
BIT DRILL SOLID 2.5X40MM (BIT) IMPLANT
BLADE MINI RND TIP GREEN BEAV (BLADE) ×1 IMPLANT
BLADE SURG 15 STRL LF DISP TIS (BLADE) ×1 IMPLANT
BLADE SURG 15 STRL SS (BLADE) ×2
BNDG CMPR 9X4 STRL LF SNTH (GAUZE/BANDAGES/DRESSINGS) ×1
BNDG COHESIVE 3X5 TAN STRL LF (GAUZE/BANDAGES/DRESSINGS) ×2 IMPLANT
BNDG ESMARK 4X9 LF (GAUZE/BANDAGES/DRESSINGS) ×2 IMPLANT
BRUSH SCRUB DISP (MISCELLANEOUS) ×1 IMPLANT
CHLORAPREP W/TINT 26ML (MISCELLANEOUS) ×2 IMPLANT
CLOTH BEACON ORANGE TIMEOUT ST (SAFETY) ×2 IMPLANT
CORDS BIPOLAR (ELECTRODE) ×2 IMPLANT
COVER MAYO STAND STRL (DRAPES) ×2 IMPLANT
COVER TABLE BACK 60X90 (DRAPES) ×2 IMPLANT
CUFF TOURNIQUET SINGLE 18IN (TOURNIQUET CUFF) ×2 IMPLANT
DECANTER SPIKE VIAL GLASS SM (MISCELLANEOUS) IMPLANT
DRAPE EXTREMITY T 121X128X90 (DRAPE) ×2 IMPLANT
DRAPE OEC MINIVIEW 54X84 (DRAPES) ×2 IMPLANT
DRAPE SURG 17X23 STRL (DRAPES) ×2 IMPLANT
DRILL SOLID 2.0X40MM (BIT) ×2
DRILL SOLID 2.5X40MM (BIT) ×2
DRSG KUZMA FLUFF (GAUZE/BANDAGES/DRESSINGS) ×1 IMPLANT
ELECT REM PT RETURN 9FT ADLT (ELECTROSURGICAL)
ELECTRODE REM PT RTRN 9FT ADLT (ELECTROSURGICAL) IMPLANT
GAUZE XEROFORM 1X8 LF (GAUZE/BANDAGES/DRESSINGS) ×2 IMPLANT
GLOVE BIO SURGEON STRL SZ 6.5 (GLOVE) ×1 IMPLANT
GLOVE BIOGEL PI IND STRL 7.0 (GLOVE) IMPLANT
GLOVE BIOGEL PI IND STRL 8.5 (GLOVE) ×1 IMPLANT
GLOVE BIOGEL PI INDICATOR 7.0 (GLOVE) ×1
GLOVE BIOGEL PI INDICATOR 8.5 (GLOVE) ×1
GLOVE EPREMIER NITRL EXT CFF L (GLOVE) IMPLANT
GLOVE EXAM NITRILE EXT CFF LRG (GLOVE) ×2 IMPLANT
GLOVE SKINSENSE NS SZ7.5 (GLOVE) ×1
GLOVE SKINSENSE STRL SZ7.5 (GLOVE) IMPLANT
GLOVE SURG ORTHO 8.0 STRL STRW (GLOVE) ×1 IMPLANT
GLOVE SURG SS PI 6.5 STRL IVOR (GLOVE) ×1 IMPLANT
GLOVE SURG SS PI 8.0 STRL IVOR (GLOVE) ×1 IMPLANT
GOWN BRE IMP PREV XXLGXLNG (GOWN DISPOSABLE) ×3 IMPLANT
GOWN PREVENTION PLUS XLARGE (GOWN DISPOSABLE) ×2 IMPLANT
GUIDE AIMING 1.5MM (WIRE) ×3 IMPLANT
K-WIRE .045X4 (WIRE) IMPLANT
NEEDLE 27GAX1X1/2 (NEEDLE) IMPLANT
NS IRRIG 1000ML POUR BTL (IV SOLUTION) ×3 IMPLANT
PACK BASIN DAY SURGERY FS (CUSTOM PROCEDURE TRAY) ×2 IMPLANT
PAD CAST 3X4 CTTN HI CHSV (CAST SUPPLIES) ×1 IMPLANT
PADDING CAST ABS 3INX4YD NS (CAST SUPPLIES)
PADDING CAST ABS 4INX4YD NS (CAST SUPPLIES)
PADDING CAST ABS COTTON 3X4 (CAST SUPPLIES) IMPLANT
PADDING CAST ABS COTTON 4X4 ST (CAST SUPPLIES) ×1 IMPLANT
PADDING CAST COTTON 3X4 STRL (CAST SUPPLIES) ×2
PEG SMOOTH LOCK 2.0X16 (Screw) ×1 IMPLANT
PEG SMOOTH LOCK 2.0X18 (Screw) ×3 IMPLANT
PEG SMOOTH LOCKING 20MM (Peg) ×1 IMPLANT
PEG THREADED LOCK 2.3X18 (Screw) ×1 IMPLANT
PENCIL BUTTON HOLSTER BLD 10FT (ELECTRODE) IMPLANT
PLATE LEFT NARROW 3H (Plate) ×1 IMPLANT
SCREW NONLOCK POLYAXIAL 3.5X11 (Screw) ×1 IMPLANT
SLEEVE SCD COMPRESS KNEE MED (MISCELLANEOUS) ×2 IMPLANT
SLING ARM FOAM STRAP LRG (SOFTGOODS) ×1 IMPLANT
SPLINT PLASTER CAST XFAST 3X15 (CAST SUPPLIES) IMPLANT
SPLINT PLASTER XTRA FASTSET 3X (CAST SUPPLIES) ×10
SPONGE GAUZE 4X4 12PLY (GAUZE/BANDAGES/DRESSINGS) ×2 IMPLANT
STOCKINETTE 4X48 STRL (DRAPES) ×2 IMPLANT
SUT VIC AB 0 CT1 27 (SUTURE)
SUT VIC AB 0 CT1 27XBRD ANBCTR (SUTURE) IMPLANT
SUT VIC AB 2-0 SH 27 (SUTURE)
SUT VIC AB 2-0 SH 27XBRD (SUTURE) IMPLANT
SUT VIC AB 3-0 FS2 27 (SUTURE) IMPLANT
SUT VICRYL 4-0 PS2 18IN ABS (SUTURE) ×2 IMPLANT
SUT VICRYL RAPID 5 0 P 3 (SUTURE) IMPLANT
SUT VICRYL RAPIDE 4/0 PS 2 (SUTURE) ×3 IMPLANT
SYR BULB 3OZ (MISCELLANEOUS) ×2 IMPLANT
SYR CONTROL 10ML LL (SYRINGE) IMPLANT
TOWEL OR 17X24 6PK STRL BLUE (TOWEL DISPOSABLE) ×2 IMPLANT
Threaded peg 22mm ×1 IMPLANT
UNDERPAD 30X30 INCONTINENT (UNDERPADS AND DIAPERS) ×2 IMPLANT
WIRE FIX 1.5 STANDARD TIP (WIRE) ×6
WIRE FIX 1.5 STD TIP (WIRE) IMPLANT
non-locking cortical screw ×2 IMPLANT

## 2012-10-28 NOTE — Transfer of Care (Signed)
Immediate Anesthesia Transfer of Care Note  Patient: [name]  Procedure(s) Performed: Procedure(s): OPEN REDUCTION INTERNAL FIXATION (ORIF) DISTAL RADIAL FRACTURE (Left) CARPAL TUNNEL RELEASE (Left)  Patient Location: PACU  Anesthesia Type:GA combined with regional for post-op pain  Level of Consciousness: sedated and responds to stimulation  Airway & Oxygen Therapy: Patient Spontanous Breathing and Patient connected to face mask oxygen  Post-op Assessment: Report given to PACU RN and Post -op Vital signs reviewed and stable  Post vital signs: Reviewed and stable  Complications: No apparent anesthesia complications

## 2012-10-28 NOTE — Anesthesia Preprocedure Evaluation (Addendum)
Anesthesia Evaluation  Patient identified by MRN, date of birth, ID band Patient awake    Reviewed: Allergy & Precautions, H&P , NPO status , Patient's Chart, lab work & pertinent test results  Airway Mallampati: I TM Distance: >3 FB Neck ROM: Full    Dental  (+) Teeth Intact and Dental Advisory Given   Pulmonary asthma , Current Smoker,  breath sounds clear to auscultation        Cardiovascular Rhythm:Regular Rate:Normal     Neuro/Psych  Neuromuscular disease (Hx of MS since 2009)    GI/Hepatic   Endo/Other    Renal/GU      Musculoskeletal   Abdominal   Peds  Hematology   Anesthesia Other Findings   Reproductive/Obstetrics                          Anesthesia Physical Anesthesia Plan  ASA: III  Anesthesia Plan: General   Post-op Pain Management:    Induction: Intravenous  Airway Management Planned: LMA  Additional Equipment:   Intra-op Plan:   Post-operative Plan: Extubation in OR  Informed Consent: I have reviewed the patients History and Physical, chart, labs and discussed the procedure including the risks, benefits and alternatives for the proposed anesthesia with the patient or authorized representative who has indicated his/her understanding and acceptance.   Dental advisory given  Plan Discussed with: CRNA, Anesthesiologist and Surgeon  Anesthesia Plan Comments:         Anesthesia Quick Evaluation

## 2012-10-28 NOTE — Progress Notes (Signed)
  Assisted Dr. Crews with left, ultrasound guided, supraclavicular block. Side rails up, monitors on throughout procedure. See vital signs in flow sheet. Tolerated Procedure well. 

## 2012-10-28 NOTE — Anesthesia Procedure Notes (Addendum)
Anesthesia Regional Block:  Supraclavicular block  Pre-Anesthetic Checklist: ,, timeout performed, Correct Patient, Correct Site, Correct Laterality, Correct Procedure, Correct Position, site marked, Risks and benefits discussed,  Surgical consent,  Pre-op evaluation,  At surgeon's request and post-op pain management  Laterality: Left and Upper  Prep: chloraprep       Needles:  Injection technique: Single-shot  Needle Type: Echogenic Needle     Needle Length: 5cm 5 cm Needle Gauge: 21    Additional Needles:  Procedures: ultrasound guided (picture in chart) Supraclavicular block Narrative:  Start time: 10/28/2012 7:52 AM End time: 10/28/2012 8:02 AM Injection made incrementally with aspirations every 5 mL.  Performed by: Personally  Anesthesiologist: Sheldon Silvan  Supraclavicular block Procedure Name: LMA Insertion Date/Time: 10/28/2012 8:55 AM Performed by: Gar Gibbon Pre-anesthesia Checklist: Patient identified, Emergency Drugs available, Suction available and Patient being monitored Patient Re-evaluated:Patient Re-evaluated prior to inductionOxygen Delivery Method: Circle System Utilized Preoxygenation: Pre-oxygenation with 100% oxygen Intubation Type: IV induction Ventilation: Mask ventilation without difficulty LMA: LMA inserted LMA Size: 4.0 Number of attempts: 1 Airway Equipment and Method: bite block Placement Confirmation: positive ETCO2 Tube secured with: Tape Dental Injury: Teeth and Oropharynx as per pre-operative assessment

## 2012-10-28 NOTE — Brief Op Note (Signed)
10/28/2012  10:25 AM  PATIENT:  Glenice Bow  42 y.o. female  PRE-OPERATIVE DIAGNOSIS:  FRACTURE DISTAL RADIUS LEFT, CARPAL TUNNEL SYNDROME LEFT  POST-OPERATIVE DIAGNOSIS:  FRACTURE DISTAL RADIUS LEFT, CARPAL TUNNEL SYNDROME LEFT  PROCEDURE:  Procedure(s): OPEN REDUCTION INTERNAL FIXATION (ORIF) DISTAL RADIAL FRACTURE (Left) CARPAL TUNNEL RELEASE (Left)  SURGEON:  Surgeon(s) and Role:    * Nicki Reaper, MD - Primary    * Tami Ribas, MD - Assisting  PHYSICIAN ASSISTANT:   ASSISTANTS: K Friedrich Harriott,MD   ANESTHESIA:   regional and general  EBL:  Total I/O In: 1000 [I.V.:1000] Out: -   BLOOD ADMINISTERED:none  DRAINS: none   LOCAL MEDICATIONS USED:  NONE  SPECIMEN:  No Specimen  DISPOSITION OF SPECIMEN:  N/A  COUNTS:  YES  TOURNIQUET:   Total Tourniquet Time Documented: Upper Arm (Left) - 67 minutes Total: Upper Arm (Left) - 67 minutes   DICTATION: .Other Dictation: Dictation Number [id number]  PLAN OF CARE: Discharge to home after PACU  PATIENT DISPOSITION:  PACU - hemodynamically stable.

## 2012-10-28 NOTE — Anesthesia Postprocedure Evaluation (Signed)
  Anesthesia Post-op Note  Patient: [name]  Procedure(s) Performed: Procedure(s): OPEN REDUCTION INTERNAL FIXATION (ORIF) DISTAL RADIAL FRACTURE (Left) CARPAL TUNNEL RELEASE (Left)  Patient Location: PACU  Anesthesia Type:GA combined with regional for post-op pain  Level of Consciousness: sedated  Airway and Oxygen Therapy: Patient Spontanous Breathing and Patient connected to face mask oxygen  Post-op Pain: none  Post-op Assessment: Post-op Vital signs reviewed  Post-op Vital Signs: Reviewed  Complications: No apparent anesthesia complications

## 2012-10-28 NOTE — Telephone Encounter (Signed)
I LMVM for pt that tried to fax to her the tysabri forms.  Did not go thru.  She is to call me when this may be good time for her and also for her to get back to me about whne her last injectiion of tysabri is.

## 2012-10-28 NOTE — H&P (Signed)
[name] is a 42 year old right hand dominant female  an injury to her left wrist from a fall off a ladder approximately 5 feet. She is on disability for MS. She was originally seen at [facility] where she was placed in a dorsal palmar splint. X-rays were taken revealing a comminuted intraarticular fracture distal radius left side. She was referred to Dr. Farris Has who changed her to a splint which had been distal to her MCP joint creases as visualized on her x-ray. She is complaining of stiffness to those. She has no prior history of injury. She complains of constant, severe to extremely severe stabbing and throbbing pain with a feeling of swelling and numbness. Ice, elevation and rest have helped.   PAST MEDICAL HISTORY: She is allergic to Doxycycline, Levaquin and Latex. She is on Keppra, Klonopin, Roseum, Nortriptyline, Gabapentin, AcipHex, Valium, Avonex, Cymbalta, Celexa, Lunesta, magnesium, Percocet and Ventolin.  She has had right wrist surgery by myself, right shoulder surgery, right elbow surgery. foot surgery, abdominal myomectomy, and ovary removed.  FAMILY H ISTORY: Positive for heart disease and arthritis. She smokes 1 PPD and is advised to quit and the reasons behind this. She does not drink. She is married and retired.   REVIEW OF SYSTEMS: Positive for weight loss, loss of appetite, fever, glasses, ringing in her ears, asthma, rash, balance problems, depression, nervousness, sleep disorder, otherwise negative.  [name] is an 42 y.o. female.   Chief Complaint: fracture left wrist with numbness and tingling fingers  HPI: see above  Past Medical History  Diagnosis Date  . Multiple sclerosis   . Migraine headache   . Anxiety and depression   . Gastroenteritis, acute     w/ dehydration  . Asthma   . Multiple allergies   . Complication of anesthesia     cries  . Chronic pain     back,legs,neck  . Arthritis   . Rheumatic disease   . Depression   .  Neuromuscular disorder     neuropathy pain    Past Surgical History  Procedure Laterality Date  . Myomectomy abdominal approach    . Rotator cuff repair  2000    rt  . Wrist surgery Right 2006  . Elbow arthroscopy      rt  . Dilation and curettage of uterus    . Tonsillectomy    . Ovary surgery      rt 1987  . Bunionectomy      both feet  . Foot neuroma surgery      both feet    Family History  Problem Relation Age of Onset  . Rheum arthritis Mother   . Lupus Mother   . Sjogren's syndrome Mother   . Lupus Father   . Cancer - Other Father    Social History:  reports that she has been smoking Cigarettes.  She has been smoking about 1.00 pack per day. She does not have any smokeless tobacco history on file. She reports that  drinks alcohol. She reports that she does not use illicit drugs.  Allergies:  Allergies  Allergen Reactions  . Levaquin (Levofloxacin In D5w) Shortness Of Breath  . Doxycycline Hives  . Other     Seasonal  . Latex Rash    No prescriptions prior to admission    No results found for this or any previous visit (from the past 48 hour(s)).  No results found.   Pertinent items are noted in HPI.  Last menstrual period  10/24/2012.  General appearance: alert, cooperative and appears stated age Head: Normocephalic, without obvious abnormality Neck: no JVD Resp: clear to auscultation bilaterally Cardio: regular rate and rhythm, S1, S2 normal, no murmur, click, rub or gallop GI: soft, non-tender; bowel sounds normal; no masses,  no organomegaly Extremities: extremities normal, atraumatic, no cyanosis or edema Pulses: 2+ and symmetric Skin: Skin color, texture, turgor normal. No rashes or lesions Neurologic: Grossly normal Incision/Wound: na  Assessment/Plan X-rays reveal a comminuted intraarticular fracture with loss of volar tilt. She is approximately 5 degrees dorsally angulated at the present time with significant dorsal comminution.   We  have discussed treatment of this with volar plate fixation. The pre, peri and post op course are discussed along with risks and complications.  She is aware there is no guarantee with surgery, possibility of infection, recurrence, injury to arteries, nerves and tendons, incomplete relief of symptoms and dystrophy.   Diagnosis: Distal radius fracture comminuted intraarticular with the loss of tilt with probable carpal tunnel syndrome.  We would recommend fixation along with carpal tunnel release. This will be scheduled as an outpatient under regional anesthesia.  Clark Cuff R 10/28/2012, 5:42 AM

## 2012-10-28 NOTE — Op Note (Signed)
Dictation Number [id number]

## 2012-10-29 NOTE — Op Note (Signed)
NAMEVITORIA, [name]               ACCOUNT NO.:  [id number]  MEDICAL RECORD NO.:  [id number]  LOCATION:                                 FACILITY:  PHYSICIAN:  Cindee Salt, [name]       DATE OF BIRTH:  1970-08-26  DATE OF PROCEDURE:  10/28/2012 DATE OF DISCHARGE:  10/28/2012                              OPERATIVE REPORT   PREOPERATIVE DIAGNOSIS:  Fracture of intra-articular left distal radius with carpal tunnel syndrome.  POSTOPERATIVE DIAGNOSIS:  Fracture of intra-articular left distal radius with carpal tunnel syndrome.  OPERATION:  Open reduction and internal fixation, left distal radius with carpal tunnel release, left hand.  SURGEON:  Cindee Salt, [name]  ANESTHESIA:  Supraclavicular block general.  ANESTHESIOLOGIST:  Sheldon Silvan, [name]  HISTORY:  The patient is a 42 year old female who suffered a fall from a ladder with a comminuted intra-articular fracture of her left distal radius, this has displaced.  She is also complaining of numbness and tingling in her finger tips.  She has elected to undergo release of the carpal canal with open reduction and internal fixation of left distal radius.  Pre, peri, and postoperative course have been discussed along with risks and complications.  She is aware that there is no guarantee with the surgery; possibility of infection; recurrence of injury to arteries, nerves, tendons, incomplete relief of symptoms, dystrophy.  In the preoperative area, the patient is seen, the extremity marked by both patient and surgeon.  Antibiotic given.  DESCRIPTION OF PROCEDURE:  The patient was brought to the operating room where a supraclavicular block general anesthetic were carried out without difficulty.  She was prepped using ChloraPrep after Betadine scrub.  A 3-minute dry time was allowed.  Time-out taken, confirming patient and procedure.  The left arm was free.  The patient was in supine position.  The limb was exsanguinated with an Esmarch  bandage. Tourniquet placed high on the arm, was inflated to 250 mmHg.  A volar radial incision was made for application of a volar radial plate.  This was a skeletal dynamics plate.  The incision carried down through the flexor carpi radialis tendon sheath.  Bleeders were electrocauterized with bipolar.  Dissection carried down to the pronator quadratus.  The brachioradialis was released after identification of the first dorsal compartment.  The fracture was then opened after incising the pronator quadratus and leaving it attached ulnarly.  This allowed the fracture to be opened and debrided.  A small left skeletal dynamics DVR plate was then selected.  This was pinned in position.  X-rays confirmed positioning it.  This was slightly adjusted, was felt to adequately reduce the fracture.  The gliding screw was then placed, this was measured at 14 mm.  The distal screws were then placed.  The first being a nonlocking compressive screw in the most ulnar hole.  X-rays confirmed that the position of the plate and fracture were well aligned.  The remaining distal pegs were placed.  These were all locking, measured between 16 and 20 mm.  The compressive locking screw was then replaced with a locking peg, this measured 18 mm.  A most radial styloid fixation was with a  locking screw.  X-rays confirmed positioning AP, lateral, and oblique but none entered into the articular surface and none appeared long dorsally.  The remaining proximal screws were placed, these measured 13 and 11 mm.  These were placed proximally firmly fixing the fracture in position.  Full pronation, supination, flexion, and extension was noted.  X-rays confirmed positioning of the plate and screws with adequate reduction of the fracture.  The wound was copiously irrigated with saline.  The pronator quadratus was then repaired with figure-of-eight 4-0 Vicryl sutures, subcutaneous tissue with interrupted 4-0 Vicryl, and skin  with interrupted 4-0 Vicryl.  Separate incision was then made for carpal tunnel release, left side, carried down through subcutaneous tissue.  Bleeders were electrocauterized with bipolar. Palmar fascia was split.  Superficial palmar arch identified.  Flexor tendon to the ring little finger identified to the ulnar side of the median nerve.  The carpal retinaculum was incised with sharp dissection. Right angle and Sewall retractor were placed between skin and forearm fascia.  The forearm fascia released for approximately a centimeter and a half to 2 cm proximal to the wrist crease under direct vision.  Motor branch was noted to enter into muscle distally.  No further lesions were identified.  The wound was irrigated and closed with interrupted 4-0 Vicryl Rapide sutures.  Sterile compressive dressing, volar splint applied.  On deflation of the tourniquet, all fingers immediately pinked.  She was taken to the recovery room for observation in satisfactory condition.  She will be discharged home to return to the [facility] in 1 week, on Percocet.          ______________________________ Cindee Salt, [name]     GK/MEDQ  D:  10/28/2012  T:  10/28/2012  Job:  [id number]

## 2012-10-30 ENCOUNTER — Encounter (HOSPITAL_BASED_OUTPATIENT_CLINIC_OR_DEPARTMENT_OTHER): Payer: Self-pay | Admitting: Orthopedic Surgery

## 2012-10-31 NOTE — Telephone Encounter (Signed)
I placed form in mail for her to sing and get back to [place].

## 2012-11-06 ENCOUNTER — Other Ambulatory Visit: Payer: Self-pay

## 2012-11-06 MED ORDER — CLONAZEPAM 1 MG PO TABS: 1.0000 mg | ORAL_TABLET | Freq: Two times a day (BID) | ORAL | Status: DC | PRN

## 2012-11-11 DIAGNOSIS — N838 Other noninflammatory disorders of ovary, fallopian tube and broad ligament: Secondary | ICD-10-CM

## 2012-11-11 HISTORY — DX: Other noninflammatory disorders of ovary, fallopian tube and broad ligament: N83.8

## 2012-11-25 ENCOUNTER — Telehealth: Payer: Self-pay | Admitting: *Deleted

## 2012-11-25 NOTE — Telephone Encounter (Signed)
Have tysabri authorization need order placed for tysabri.   I called [facility] and [facility] program, they are to fax the authorization and SOB information to me.

## 2012-11-25 NOTE — Telephone Encounter (Signed)
Please order tysabri for pt [name]

## 2012-11-26 NOTE — Telephone Encounter (Signed)
I called and made appt for pt for tysabri on 12-16-12 at 0900.  Informed pt.  Bill Salinas is to fax to me authorization for infusion site (they do not have). I called [facility] and they will fax me authorization form and also Tasha relayed to me that pt approved for financial assistance on both medication and infusions.  I called and informed pt appt and relayed approvals.  She is to call [facility] at [phone] if time is not good for her.

## 2012-11-27 ENCOUNTER — Other Ambulatory Visit: Payer: Self-pay | Admitting: Diagnostic Neuroimaging

## 2012-11-27 DIAGNOSIS — G35 Multiple sclerosis: Secondary | ICD-10-CM

## 2012-12-02 NOTE — Telephone Encounter (Signed)
LMVM for pt that appt for [facility] tysabri first infusion is 12-16-12 at 0900.  Pt is [place] now, but will be home and confirmed the appt.   LMVM for home as a reminder also.

## 2012-12-12 ENCOUNTER — Telehealth: Payer: Self-pay | Admitting: *Deleted

## 2012-12-12 NOTE — Telephone Encounter (Signed)
Pt needs premeds (tylenol and claritin) orders for her tysabri scheduled 12-16-12.

## 2012-12-15 ENCOUNTER — Other Ambulatory Visit: Payer: Self-pay | Admitting: Diagnostic Neuroimaging

## 2012-12-15 DIAGNOSIS — G35 Multiple sclerosis: Secondary | ICD-10-CM

## 2012-12-16 ENCOUNTER — Other Ambulatory Visit (HOSPITAL_COMMUNITY): Payer: Self-pay | Admitting: Diagnostic Neuroimaging

## 2012-12-16 ENCOUNTER — Encounter (HOSPITAL_COMMUNITY): Admission: RE | Admit: 2012-12-16 | Payer: 59 | Source: Ambulatory Visit

## 2012-12-24 ENCOUNTER — Encounter (HOSPITAL_COMMUNITY): Admission: RE | Admit: 2012-12-24 | Payer: 59 | Source: Ambulatory Visit

## 2012-12-30 ENCOUNTER — Telehealth: Payer: Self-pay | Admitting: Neurology

## 2012-12-30 ENCOUNTER — Encounter (HOSPITAL_COMMUNITY): Payer: Self-pay

## 2012-12-30 ENCOUNTER — Encounter (HOSPITAL_COMMUNITY)
Admission: RE | Admit: 2012-12-30 | Discharge: 2012-12-30 | Disposition: A | Discharge status: Home / Self Care | Payer: 59 | Source: Ambulatory Visit | Attending: Diagnostic Neuroimaging | Admitting: Diagnostic Neuroimaging

## 2012-12-30 DIAGNOSIS — G35 Multiple sclerosis: Secondary | ICD-10-CM | POA: Insufficient documentation

## 2012-12-30 HISTORY — DX: Other noninflammatory disorders of ovary, fallopian tube and broad ligament: N83.8

## 2012-12-30 HISTORY — DX: Fibromyalgia: M79.7

## 2012-12-30 HISTORY — DX: Hypothyroidism, unspecified: E03.9

## 2012-12-30 NOTE — Telephone Encounter (Signed)
I spoke with the nurse from [facility] and patient was there to receive her first dose of Tysabri.  The nurse was concerned because the patient reported she had a pelvic mass, a sore or reddened area on the inside of her lower lip, has been having low WBC counts, and is getting over a GI virus.  Dr. Anne Hahn as WID said to hold tysabri for a week or 2 and asked that more information get sent over about the pelvic mass.  Nurse was in agreement.

## 2012-12-30 NOTE — Progress Notes (Addendum)
Reported to Lupita Leash at [facility] re: to "pelvic mass"  Left ovary; virus less than 2 weeks ago that caused n/v and has been drained ever since; has a reddened lower lip with a bump; and a history of a low WBC

## 2012-12-30 NOTE — Progress Notes (Signed)
Patient rescheduled for 01-13-13. Instructed to contact her GP and get [place] results to Dr Marjory Lies.

## 2013-01-06 ENCOUNTER — Telehealth: Payer: Self-pay | Admitting: Diagnostic Neuroimaging

## 2013-01-06 NOTE — Telephone Encounter (Signed)
I called pt and she is tearful.  Stated that the last 2 days she is having numbness/ cannot feel her anterior thighs to her inner thighs bilaterally, along with weakness and tingling in lips.  She has not had her tysabri infusion due to pelvic mass, gi illness , stress of family loss )brother with S3 colon Ca.  I made appt for her to come in at 0900 with LL/NP 01-07-13.  I caller Kathryne Sharper [facility] ato try and get MR for ultrasounds of pelvis relating to mass on her ovary.  Sees Primus Bravo, NP  [phone].  Has appt with [facility] upcoming [phone]  (? Correct).

## 2013-01-07 ENCOUNTER — Encounter: Payer: Self-pay | Admitting: Nurse Practitioner

## 2013-01-07 ENCOUNTER — Ambulatory Visit (INDEPENDENT_AMBULATORY_CARE_PROVIDER_SITE_OTHER): Payer: 59 | Admitting: Nurse Practitioner

## 2013-01-07 ENCOUNTER — Telehealth: Payer: Self-pay

## 2013-01-07 VITALS — BP 111/73 | HR 80 | Ht 67.0 in | Wt 132.0 lb

## 2013-01-07 DIAGNOSIS — G35 Multiple sclerosis: Secondary | ICD-10-CM

## 2013-01-07 DIAGNOSIS — F411 Generalized anxiety disorder: Secondary | ICD-10-CM

## 2013-01-07 DIAGNOSIS — F4321 Adjustment disorder with depressed mood: Secondary | ICD-10-CM

## 2013-01-07 DIAGNOSIS — F418 Other specified anxiety disorders: Secondary | ICD-10-CM | POA: Insufficient documentation

## 2013-01-07 NOTE — Patient Instructions (Addendum)
We will schedule MRI brain.  Check blood work and urine for infection.  Upon results of MRI, Dr. Marjory Lies will make determination of plan of care.
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
## 2013-01-07 NOTE — Telephone Encounter (Signed)
Done patient was seen today by Heide Guile

## 2013-01-08 LAB — COMPREHENSIVE METABOLIC PANEL
ALT: 11 IU/L (ref 0–32)
AST: 14 IU/L (ref 0–40)
Albumin/Globulin Ratio: 1.6 (ref 1.1–2.5)
Albumin: 4.4 g/dL (ref 3.5–5.5)
Alkaline Phosphatase: 75 IU/L (ref 39–117)
BUN/Creatinine Ratio: 16 (ref 9–23)
BUN: 12 mg/dL (ref 6–24)
CO2: 27 mmol/L (ref 18–29)
Calcium: 9.8 mg/dL (ref 8.7–10.2)
Chloride: 104 mmol/L (ref 96–108)
Creatinine, Ser: 0.75 mg/dL (ref 0.57–1.00)
GFR calc Af Amer: 113 mL/min/{1.73_m2} (ref >59)
GFR calc non Af Amer: 98 mL/min/{1.73_m2} (ref >59)
Globulin, Total: 2.7 g/dL (ref 1.5–4.5)
Glucose: 89 mg/dL (ref 65–99)
Potassium: 4.3 mmol/L (ref 3.5–5.2)
Sodium: 139 mmol/L (ref 134–144)
Total Bilirubin: 0.2 mg/dL (ref 0.0–1.2)
Total Protein: 7.1 g/dL (ref 6.0–8.5)

## 2013-01-08 LAB — URINALYSIS, ROUTINE W REFLEX MICROSCOPIC
Bilirubin, UA: NEGATIVE
Glucose, UA: NEGATIVE
Ketones, UA: NEGATIVE
Leukocytes, UA: NEGATIVE
Nitrite, UA: NEGATIVE
Protein, UA: NEGATIVE
RBC, UA: NEGATIVE
Specific Gravity, UA: >1.03 — ABNORMAL HIGH (ref 1.005–1.030)
Urobilinogen, Ur: 0.2 mg/dL (ref 0.0–1.9)
pH, UA: 5.5 (ref 5.0–7.5)

## 2013-01-08 LAB — CBC
HCT: 40.4 % (ref 34.0–46.6)
Hemoglobin: 14.1 g/dL (ref 11.1–15.9)
MCH: 33.9 pg — ABNORMAL HIGH (ref 26.6–33.0)
MCHC: 34.9 g/dL (ref 31.5–35.7)
MCV: 97 fL (ref 79–97)
Platelets: 202 10*3/uL (ref 150–379)
RBC: 4.16 x10E6/uL (ref 3.77–5.28)
RDW: 12.3 % (ref 12.3–15.4)
WBC: 8 10*3/uL (ref 3.4–10.8)

## 2013-01-09 ENCOUNTER — Telehealth: Payer: Self-pay

## 2013-01-09 NOTE — Progress Notes (Signed)
I reviewed note and agree with plan.   Draylen Lobue R. Aundreya Souffrant, MD 01/09/2013, 5:05 PM Certified in Neurology, Neurophysiology and Neuroimaging  [facility] [address], [address] [place], [place] [address] [phone]  

## 2013-01-09 NOTE — Telephone Encounter (Signed)
Calling to confirm okay for patient to have Tysabri on January 13, 2013. Patient had some symptoms that may require delay of treatment. I will Discuss with Heide Guile and Dr. Marjory Lies, if necessary and will get back with [facility].

## 2013-01-09 NOTE — Telephone Encounter (Signed)
I consulted with Heide Guile, NP and Dr. Marjory Lies. MRI has not been reviewed yet. Dr. Marjory Lies wants the Tysabri to remain on hold for now. I will notify patient.

## 2013-01-09 NOTE — Telephone Encounter (Signed)
I called and spoke with patient. I let her know that Dr. Marjory Lies wants to hold off on the Tysabri until he reviews her MRI. Patient states that she has not gotten a call yet on scheduling the MRI. I reviewed the referral information. I let patient know the authorization was approved two days ago and it can take up to a week to get a call to schedule that. We will contact her with the next steps after the MRI comes back.   Patient thanked me for providing her with this update.

## 2013-01-13 ENCOUNTER — Encounter (HOSPITAL_COMMUNITY): Admission: RE | Admit: 2013-01-13 | Payer: 59 | Source: Ambulatory Visit

## 2013-01-15 ENCOUNTER — Ambulatory Visit
Admission: RE | Admit: 2013-01-15 | Discharge: 2013-01-15 | Disposition: A | Discharge status: Home / Self Care | Payer: 59 | Source: Ambulatory Visit | Attending: Nurse Practitioner | Admitting: Nurse Practitioner

## 2013-01-15 DIAGNOSIS — G35 Multiple sclerosis: Secondary | ICD-10-CM

## 2013-01-15 MED ORDER — GADOBENATE DIMEGLUMINE 529 MG/ML IV SOLN
12.0000 mL | Freq: Once | INTRAVENOUS | Status: AC | PRN
  Administered 2013-01-15: 12 mL via INTRAVENOUS

## 2013-01-19 ENCOUNTER — Other Ambulatory Visit: Payer: 59

## 2013-01-20 NOTE — Progress Notes (Signed)
Quick Note:  I called and gave results of MRI to pts brother who lives with her. MRI with overall no significant changes from last MRI 09/2012. Pt to keep appt on 01-21-13 re: MS therapy. He would relay the message. Pt was asleep. ______

## 2013-01-21 ENCOUNTER — Ambulatory Visit: Payer: 59 | Admitting: Nurse Practitioner

## 2013-01-22 ENCOUNTER — Telehealth: Payer: Self-pay | Admitting: Nurse Practitioner

## 2013-01-22 NOTE — Telephone Encounter (Signed)
Call pt about her appt. And the pt acknowledge that she was coming. °

## 2013-01-23 ENCOUNTER — Encounter: Payer: Self-pay | Admitting: Nurse Practitioner

## 2013-01-23 ENCOUNTER — Ambulatory Visit (INDEPENDENT_AMBULATORY_CARE_PROVIDER_SITE_OTHER): Payer: 59 | Admitting: Nurse Practitioner

## 2013-01-23 VITALS — BP 132/80 | HR 98 | Temp 99.1°F | Ht 67.0 in | Wt 134.0 lb

## 2013-01-23 DIAGNOSIS — F411 Generalized anxiety disorder: Secondary | ICD-10-CM

## 2013-01-23 DIAGNOSIS — G35 Multiple sclerosis: Secondary | ICD-10-CM
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
## 2013-01-27 NOTE — Progress Notes (Signed)
I reviewed note and agree with plan.   Lalonnie Shaffer R. Celsa Nordahl, MD 01/27/2013, 11:03 PM Certified in Neurology, Neurophysiology and Neuroimaging  [facility] [address], [address] [place], [place] [address] [phone]  

## 2013-02-05 ENCOUNTER — Other Ambulatory Visit: Payer: Self-pay

## 2013-02-05 MED ORDER — ESZOPICLONE 3 MG PO TABS: 3.0000 mg | ORAL_TABLET | Freq: Every evening | ORAL | Status: DC | PRN

## 2013-02-05 NOTE — Telephone Encounter (Signed)
Rx signed and faxed.

## 2013-02-25 ENCOUNTER — Encounter (HOSPITAL_COMMUNITY)
Admission: RE | Admit: 2013-02-25 | Discharge: 2013-02-25 | Disposition: A | Discharge status: Home / Self Care | Payer: 59 | Source: Ambulatory Visit | Attending: Diagnostic Neuroimaging | Admitting: Diagnostic Neuroimaging

## 2013-02-25 VITALS — BP 118/75 | HR 62 | Temp 98.4°F | Resp 18

## 2013-02-25 DIAGNOSIS — G35 Multiple sclerosis: Secondary | ICD-10-CM | POA: Insufficient documentation

## 2013-02-25 MED ORDER — ACETAMINOPHEN 500 MG PO TABS
1000.0000 mg | ORAL_TABLET | Freq: Once | ORAL | Status: AC
  Administered 2013-02-25: 1000 mg via ORAL
  Filled 2013-02-25: qty 2

## 2013-02-25 MED ORDER — LORATADINE 10 MG PO TABS: 10.0000 mg | ORAL_TABLET | Freq: Once | ORAL | Status: DC

## 2013-02-25 MED ORDER — SODIUM CHLORIDE 0.9 % IV SOLN
300.0000 mg | INTRAVENOUS | Status: DC
  Administered 2013-02-25: 300 mg via INTRAVENOUS
  Filled 2013-02-25: qty 15

## 2013-02-25 MED ORDER — SODIUM CHLORIDE 0.9 % IV SOLN
INTRAVENOUS | Status: DC
  Administered 2013-02-25: 11:00:00 via INTRAVENOUS

## 2013-03-20 ENCOUNTER — Telehealth: Payer: Self-pay | Admitting: *Deleted

## 2013-03-20 NOTE — Telephone Encounter (Signed)
Has appt Monday.   Need order for tysabri.
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
## 2013-03-23 NOTE — Telephone Encounter (Signed)
I called and spoke to [facility] with [facility].  Pt had not arrived there yet.   Dr. Sylvan Cheese, GYN in [place] [phone]  Records received with 02-02-13 appt.  Primus Bravo, NP (pcp) received notes from there office along with pelvic US.  Both shown to Dr. Marjory Lies.
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
## 2013-03-25 ENCOUNTER — Encounter (HOSPITAL_COMMUNITY): Admission: RE | Admit: 2013-03-25 | Payer: 59 | Source: Ambulatory Visit

## 2013-05-12 ENCOUNTER — Telehealth: Payer: Self-pay | Admitting: *Deleted

## 2013-05-12 NOTE — Telephone Encounter (Signed)
Pt called and has seen dermatology last week, started on Cephalexin for a staph infection, ( 2 open spots (leg and face). on Abx since 05-08-13.  She has had only one tysabri infusion at this point.  No change at this time.  Will be in touch with dermatologist at end of course and will then make determination if needs minor procedure.  I told her that most likely she will not get tysabri until she is better.   Will forward to Dr. Marjory Lies.

## 2013-05-29 NOTE — Telephone Encounter (Signed)
Agree. -VRP 

## 2013-06-01 NOTE — Telephone Encounter (Signed)
I called pt and she informed me that she is on her last day of 2nd round of Abx.  Her leg is still no better.   She is out of town for 2 wks.  She will contact us when able to proceed with tysabri.

## 2013-07-29 ENCOUNTER — Telehealth: Payer: Self-pay | Admitting: *Deleted

## 2013-07-29 NOTE — Telephone Encounter (Signed)
Chanel called about pt and tysabri.  I relayed the last note on 06-01-13 that I had spoken to pt and she was out of town for 2 wks.   Would call back.  Have not seen her.  Chanel with [facility] called pt and LM that was calling about her tysabri and infusions.

## 2013-08-18 ENCOUNTER — Telehealth: Payer: Self-pay | Admitting: *Deleted

## 2013-08-18 NOTE — Telephone Encounter (Signed)
I called pt and LMVM for her at home about [facility] form received and asking about her tysabri.   Last time seen was 01-2013.  Made appt for her with Dr. Marjory LiesPenumalli 08-27-13 at 1330.  Only received tysabri once.  Asked her to return call.

## 2013-08-19 NOTE — Telephone Encounter (Signed)
I called home , spoke to CapitanSteven her brother.  He gave me her cell numbers.  I LMVM on both cells for her to confirm appt.

## 2013-08-25 ENCOUNTER — Telehealth: Payer: Self-pay | Admitting: Diagnostic Neuroimaging

## 2013-08-25 NOTE — Telephone Encounter (Signed)
Pt states she is returning sandy's call

## 2013-08-25 NOTE — Telephone Encounter (Signed)
See last message, sent to [place].

## 2013-08-25 NOTE — Telephone Encounter (Signed)
Pt called back and she is up in [place].  Still be treated for staph infection.  (possible to be admitted to hospital for treatment).  Will hold for now re: to tysabri infusions.

## 2013-08-25 NOTE — Telephone Encounter (Signed)
See 08-18-13 note.

## 2013-08-27 ENCOUNTER — Ambulatory Visit: Payer: Self-pay | Admitting: Diagnostic Neuroimaging

## 2014-03-16 ENCOUNTER — Encounter: Payer: Self-pay | Admitting: Diagnostic Neuroimaging

## 2014-03-16 ENCOUNTER — Ambulatory Visit (INDEPENDENT_AMBULATORY_CARE_PROVIDER_SITE_OTHER): Payer: 59 | Admitting: Diagnostic Neuroimaging

## 2014-03-16 VITALS — BP 120/78 | HR 78 | Temp 97.4°F | Ht 67.0 in | Wt 167.6 lb

## 2014-03-16 DIAGNOSIS — R269 Unspecified abnormalities of gait and mobility: Secondary | ICD-10-CM

## 2014-03-16 DIAGNOSIS — R21 Rash and other nonspecific skin eruption: Secondary | ICD-10-CM

## 2014-03-16 DIAGNOSIS — R413 Other amnesia: Secondary | ICD-10-CM

## 2014-03-16 DIAGNOSIS — G35 Multiple sclerosis: Secondary | ICD-10-CM
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
## 2014-03-16 NOTE — Patient Instructions (Signed)
I will check MRI scans. 

## 2014-03-17 ENCOUNTER — Telehealth: Payer: Self-pay | Admitting: Diagnostic Neuroimaging

## 2014-03-17 ENCOUNTER — Other Ambulatory Visit: Payer: 59

## 2014-03-17 NOTE — Telephone Encounter (Signed)
Rene Kocher with [employer] @ [phone], * 2, ext [phone], re faxing 6 month Final Discontinued Questionnaire for Tysabri Treatment.  Previous dates faxed were 10/16 and 10/23.  Please fax back to [phone].

## 2014-03-24 ENCOUNTER — Other Ambulatory Visit: Payer: 59

## 2014-06-16 ENCOUNTER — Ambulatory Visit: Payer: 59 | Admitting: Diagnostic Neuroimaging

## 2014-06-17 ENCOUNTER — Encounter: Payer: Self-pay | Admitting: Diagnostic Neuroimaging

## 2014-09-12 HISTORY — PX: CHOLECYSTECTOMY: SHX55

## 2015-03-24 DIAGNOSIS — Z0289 Encounter for other administrative examinations: Secondary | ICD-10-CM

## 2015-04-15 ENCOUNTER — Inpatient Hospital Stay: Admission: RE | Admit: 2015-04-15 | Payer: Self-pay | Source: Ambulatory Visit

## 2015-04-15 ENCOUNTER — Other Ambulatory Visit: Payer: Self-pay

## 2015-04-26 ENCOUNTER — Ambulatory Visit
Admission: RE | Admit: 2015-04-26 | Discharge: 2015-04-26 | Disposition: A | Discharge status: Home / Self Care | Payer: Commercial Managed Care - HMO | Source: Ambulatory Visit | Attending: Diagnostic Neuroimaging | Admitting: Diagnostic Neuroimaging

## 2015-04-26 DIAGNOSIS — R413 Other amnesia: Secondary | ICD-10-CM

## 2015-04-26 DIAGNOSIS — G35 Multiple sclerosis: Secondary | ICD-10-CM

## 2015-04-26 DIAGNOSIS — R269 Unspecified abnormalities of gait and mobility: Secondary | ICD-10-CM

## 2015-04-26 DIAGNOSIS — R21 Rash and other nonspecific skin eruption: Secondary | ICD-10-CM

## 2015-04-26 MED ORDER — GADOBENATE DIMEGLUMINE 529 MG/ML IV SOLN
15.0000 mL | Freq: Once | INTRAVENOUS | Status: AC | PRN
  Administered 2015-04-26: 15 mL via INTRAVENOUS

## 2015-05-10 ENCOUNTER — Telehealth: Payer: Self-pay | Admitting: *Deleted

## 2015-05-10 NOTE — Telephone Encounter (Signed)
Received call back from patient and informed her, per Dr Marjory Lies, there is "new active plaque" on her MRI. Advised that during her FU 05/24/15 Dr Marjory Lies can review and discuss. Looked for earlier FU, none available. Phone staff made aware to put her on cancellation list and reschedule FU for sooner if comes available. Patient aware she will be called if earlier FU becomes available; verbalized understanding of call.

## 2015-05-10 NOTE — Telephone Encounter (Signed)
LVM requesting call back. Left this caller's name, number. 

## 2015-05-15 HISTORY — PX: HERNIA REPAIR: SHX51

## 2015-05-24 ENCOUNTER — Encounter: Payer: Self-pay | Admitting: Diagnostic Neuroimaging

## 2015-05-24 ENCOUNTER — Ambulatory Visit (INDEPENDENT_AMBULATORY_CARE_PROVIDER_SITE_OTHER): Payer: Medicare Other | Admitting: Diagnostic Neuroimaging

## 2015-05-24 VITALS — BP 137/89 | HR 71 | Wt 197.6 lb

## 2015-05-24 DIAGNOSIS — G35 Multiple sclerosis: Secondary | ICD-10-CM

## 2015-05-24 NOTE — Patient Instructions (Signed)
Thank you for coming to see [place] at [facility]. I hope we have been able to provide you high quality care today.  You may receive a patient satisfaction survey over the next few weeks. We would appreciate your feedback and comments so that we may continue to improve ourselves and the health of our patients.  - I will check labs and start plegridy   ~~~~~~~~~~~~~~~~~~~~~~~~~~~~~~~~~~~~~~~~~~~~~~~~~~~~~~~~~~~~~~~~~  DR. PENUMALLI'S GUIDE TO HAPPY AND HEALTHY LIVING These are some of my general health and wellness recommendations. Some of them may apply to you better than others. Please use common sense as you try these suggestions and feel free to ask me any questions.   ACTIVITY/FITNESS Mental, social, emotional and physical stimulation are very important for brain and body health. Try learning a new activity (arts, music, language, sports, games).  Keep moving your body to the best of your abilities. You can do this at home, inside or outside, the park, community center, gym or anywhere you like. Consider a physical therapist or personal trainer to get started. Consider the app Sworkit. Fitness trackers such as smart-watches, smart-phones or Fitbits can help as well.   NUTRITION Eat more plants: colorful vegetables, nuts, seeds and berries.  Eat less sugar, salt, preservatives and processed foods.  Avoid toxins such as cigarettes and alcohol.  Drink water when you are thirsty. Warm water with a slice of lemon is an excellent morning drink to start the day.  Consider these websites for more information The Nutrition Source (https://www.henry-hernandez.biz/) Precision Nutrition (WindowBlog.ch)   RELAXATION Consider practicing mindfulness meditation or other relaxation techniques such as deep breathing, prayer, yoga, tai chi, massage. See website mindful.org or the apps Headspace or Calm to help get started.   SLEEP Try  to get at least 7-8+ hours sleep per day. Regular exercise and reduced caffeine will help you sleep better. Practice good sleep hygeine techniques. See website sleep.org for more information.   PLANNING Prepare estate planning, living will, healthcare POA documents. Sometimes this is best planned with the help of an attorney. Theconversationproject.org and agingwithdignity.org are excellent resources.
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
## 2015-05-25 LAB — CBC WITH DIFFERENTIAL/PLATELET
BASOS ABS: 0 10*3/uL (ref 0.0–0.2)
Basos: 0 %
EOS (ABSOLUTE): 0.1 10*3/uL (ref 0.0–0.4)
EOS: 1 %
HEMOGLOBIN: 13.4 g/dL (ref 11.1–15.9)
Hematocrit: 40.1 % (ref 34.0–46.6)
IMMATURE GRANS (ABS): 0.1 10*3/uL (ref 0.0–0.1)
Immature Granulocytes: 1 %
LYMPHS: 17 %
Lymphocytes Absolute: 1.9 10*3/uL (ref 0.7–3.1)
MCH: 34 pg — AB (ref 26.6–33.0)
MCHC: 33.4 g/dL (ref 31.5–35.7)
MCV: 102 fL — ABNORMAL HIGH (ref 79–97)
MONOCYTES: 6 %
Monocytes Absolute: 0.6 10*3/uL (ref 0.1–0.9)
NEUTROS ABS: 8.4 10*3/uL — AB (ref 1.4–7.0)
Neutrophils: 75 %
Platelets: 234 10*3/uL (ref 150–379)
RBC: 3.94 x10E6/uL (ref 3.77–5.28)
RDW: 12.6 % (ref 12.3–15.4)
WBC: 11 10*3/uL — ABNORMAL HIGH (ref 3.4–10.8)

## 2015-05-25 LAB — COMPREHENSIVE METABOLIC PANEL
ALBUMIN: 4.4 g/dL (ref 3.5–5.5)
ALT: 16 IU/L (ref 0–32)
AST: 15 IU/L (ref 0–40)
Albumin/Globulin Ratio: 1.4 (ref 1.1–2.5)
Alkaline Phosphatase: 90 IU/L (ref 39–117)
BUN / CREAT RATIO: 20 (ref 9–23)
BUN: 16 mg/dL (ref 6–24)
CHLORIDE: 100 mmol/L (ref 96–106)
CO2: 26 mmol/L (ref 18–29)
Calcium: 10.2 mg/dL (ref 8.7–10.2)
Creatinine, Ser: 0.8 mg/dL (ref 0.57–1.00)
GFR calc non Af Amer: 89 mL/min/{1.73_m2} (ref >59)
GFR, EST AFRICAN AMERICAN: 102 mL/min/{1.73_m2} (ref >59)
GLOBULIN, TOTAL: 3.2 g/dL (ref 1.5–4.5)
GLUCOSE: 103 mg/dL — AB (ref 65–99)
Potassium: 5.3 mmol/L — ABNORMAL HIGH (ref 3.5–5.2)
SODIUM: 143 mmol/L (ref 134–144)
TOTAL PROTEIN: 7.6 g/dL (ref 6.0–8.5)

## 2015-05-26 ENCOUNTER — Telehealth: Payer: Self-pay | Admitting: *Deleted

## 2015-05-26 NOTE — Telephone Encounter (Signed)
Spoke with patient's significant other and informed him that patient's CBC, CMP results are unremarkable. Advised she will receive call when JCV results back. He verbalized understanding, appreciation for call.

## 2015-06-08 ENCOUNTER — Telehealth: Payer: Self-pay | Admitting: *Deleted

## 2015-06-08 ENCOUNTER — Encounter: Payer: Self-pay | Admitting: *Deleted

## 2015-06-08 NOTE — Telephone Encounter (Signed)
Per Dr Marjory Lies, spoke with patient and informed her that JCV lab is negative. She verbalized understanding, appreciation.

## 2015-08-22 ENCOUNTER — Ambulatory Visit (INDEPENDENT_AMBULATORY_CARE_PROVIDER_SITE_OTHER): Payer: Medicare Other | Admitting: Diagnostic Neuroimaging

## 2015-08-22 ENCOUNTER — Encounter: Payer: Self-pay | Admitting: Diagnostic Neuroimaging

## 2015-08-22 VITALS — BP 119/73 | HR 85 | Wt 199.0 lb

## 2015-08-22 DIAGNOSIS — G35 Multiple sclerosis: Secondary | ICD-10-CM | POA: Diagnosis not present

## 2015-08-22 DIAGNOSIS — R413 Other amnesia: Secondary | ICD-10-CM | POA: Diagnosis not present

## 2015-08-22 DIAGNOSIS — R269 Unspecified abnormalities of gait and mobility: Secondary | ICD-10-CM

## 2015-08-22 NOTE — Patient Instructions (Signed)
Thank you for coming to see [place] at [facility]. I hope we have been able to provide you high quality care today.  You may receive a patient satisfaction survey over the next few weeks. We would appreciate your feedback and comments so that we may continue to improve ourselves and the health of our patients.  - consider ocrevus (ocrelizumab) medication   ~~~~~~~~~~~~~~~~~~~~~~~~~~~~~~~~~~~~~~~~~~~~~~~~~~~~~~~~~~~~~~~~~  DR. PENUMALLI'S GUIDE TO HAPPY AND HEALTHY LIVING These are some of my general health and wellness recommendations. Some of them may apply to you better than others. Please use common sense as you try these suggestions and feel free to ask me any questions.   ACTIVITY/FITNESS Mental, social, emotional and physical stimulation are very important for brain and body health. Try learning a new activity (arts, music, language, sports, games).  Keep moving your body to the best of your abilities. You can do this at home, inside or outside, the park, community center, gym or anywhere you like. Consider a physical therapist or personal trainer to get started. Consider the app Sworkit. Fitness trackers such as smart-watches, smart-phones or Fitbits can help as well.   NUTRITION Eat more plants: colorful vegetables, nuts, seeds and berries.  Eat less sugar, salt, preservatives and processed foods.  Avoid toxins such as cigarettes and alcohol.  Drink water when you are thirsty. Warm water with a slice of lemon is an excellent morning drink to start the day.  Consider these websites for more information The Nutrition Source (https://www.henry-hernandez.biz/) Precision Nutrition (WindowBlog.ch)   RELAXATION Consider practicing mindfulness meditation or other relaxation techniques such as deep breathing, prayer, yoga, tai chi, massage. See website mindful.org or the apps Headspace or Calm to help get  started.   SLEEP Try to get at least 7-8+ hours sleep per day. Regular exercise and reduced caffeine will help you sleep better. Practice good sleep hygeine techniques. See website sleep.org for more information.   PLANNING Prepare estate planning, living will, healthcare POA documents. Sometimes this is best planned with the help of an attorney. Theconversationproject.org and agingwithdignity.org are excellent resources.
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
## 2015-08-23 LAB — CBC WITH DIFFERENTIAL/PLATELET
BASOS ABS: 0 10*3/uL (ref 0.0–0.2)
Basos: 0 %
EOS (ABSOLUTE): 0.4 10*3/uL (ref 0.0–0.4)
Eos: 6 %
HEMATOCRIT: 40.3 % (ref 34.0–46.6)
Hemoglobin: 13.8 g/dL (ref 11.1–15.9)
IMMATURE GRANULOCYTES: 0 %
Immature Grans (Abs): 0 10*3/uL (ref 0.0–0.1)
LYMPHS ABS: 2.4 10*3/uL (ref 0.7–3.1)
Lymphs: 35 %
MCH: 33.4 pg — ABNORMAL HIGH (ref 26.6–33.0)
MCHC: 34.2 g/dL (ref 31.5–35.7)
MCV: 98 fL — ABNORMAL HIGH (ref 79–97)
MONOS ABS: 0.9 10*3/uL (ref 0.1–0.9)
Monocytes: 13 %
NEUTROS PCT: 46 %
Neutrophils Absolute: 3.1 10*3/uL (ref 1.4–7.0)
Platelets: 245 10*3/uL (ref 150–379)
RBC: 4.13 x10E6/uL (ref 3.77–5.28)
RDW: 13.6 % (ref 12.3–15.4)
WBC: 6.8 10*3/uL (ref 3.4–10.8)

## 2015-08-23 LAB — COMPREHENSIVE METABOLIC PANEL
ALK PHOS: 82 IU/L (ref 39–117)
ALT: 50 IU/L — AB (ref 0–32)
AST: 39 IU/L (ref 0–40)
Albumin/Globulin Ratio: 1.5 (ref 1.2–2.2)
Albumin: 4.5 g/dL (ref 3.5–5.5)
BUN/Creatinine Ratio: 13 (ref 9–23)
BUN: 12 mg/dL (ref 6–24)
Bilirubin Total: 0.3 mg/dL (ref 0.0–1.2)
CO2: 25 mmol/L (ref 18–29)
Calcium: 9.9 mg/dL (ref 8.7–10.2)
Chloride: 101 mmol/L (ref 96–106)
Creatinine, Ser: 0.89 mg/dL (ref 0.57–1.00)
GFR calc Af Amer: 90 mL/min/{1.73_m2} (ref >59)
GFR calc non Af Amer: 78 mL/min/{1.73_m2} (ref >59)
GLOBULIN, TOTAL: 3 g/dL (ref 1.5–4.5)
Glucose: 92 mg/dL (ref 65–99)
POTASSIUM: 5.6 mmol/L — AB (ref 3.5–5.2)
SODIUM: 140 mmol/L (ref 134–144)
Total Protein: 7.5 g/dL (ref 6.0–8.5)

## 2015-08-23 LAB — HEPATITIS B CORE ANTIBODY, TOTAL: HEP B C TOTAL AB: NEGATIVE

## 2015-08-23 LAB — HEPATITIS B SURFACE ANTIGEN: HEP B S AG: NEGATIVE

## 2015-08-23 LAB — HEPATITIS B SURFACE ANTIBODY,QUALITATIVE: HEP B SURFACE AB, QUAL: NONREACTIVE

## 2015-08-24 ENCOUNTER — Telehealth: Payer: Self-pay | Admitting: *Deleted

## 2015-08-24 NOTE — Telephone Encounter (Signed)
Per Dr Marjory Lies, spoke with patient and informed her that her lab results show a few abnormal values, but nothing concerning. No need for repeat labs at this time. Dr Marjory Lies will see her for 3 month FU and may repeat labs at that time. She verbalized understanding, appreciation.

## 2015-11-21 ENCOUNTER — Encounter: Payer: Self-pay | Admitting: Diagnostic Neuroimaging

## 2015-11-21 ENCOUNTER — Ambulatory Visit (INDEPENDENT_AMBULATORY_CARE_PROVIDER_SITE_OTHER): Payer: Medicare Other | Admitting: Diagnostic Neuroimaging

## 2015-11-21 VITALS — BP 116/81 | HR 79 | Wt 191.2 lb

## 2015-11-21 DIAGNOSIS — G35 Multiple sclerosis: Secondary | ICD-10-CM

## 2015-11-21 DIAGNOSIS — R413 Other amnesia: Secondary | ICD-10-CM | POA: Diagnosis not present

## 2015-11-21 DIAGNOSIS — R269 Unspecified abnormalities of gait and mobility: Secondary | ICD-10-CM | POA: Diagnosis not present
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
## 2015-11-22 ENCOUNTER — Telehealth: Payer: Self-pay | Admitting: *Deleted

## 2015-11-22 LAB — CBC WITH DIFFERENTIAL/PLATELET
Basophils Absolute: 0 10*3/uL (ref 0.0–0.2)
Basos: 1 %
EOS (ABSOLUTE): 0.2 10*3/uL (ref 0.0–0.4)
EOS: 2 %
HEMOGLOBIN: 15.2 g/dL (ref 11.1–15.9)
Hematocrit: 44.7 % (ref 34.0–46.6)
IMMATURE GRANS (ABS): 0 10*3/uL (ref 0.0–0.1)
IMMATURE GRANULOCYTES: 0 %
Lymphocytes Absolute: 1.7 10*3/uL (ref 0.7–3.1)
Lymphs: 21 %
MCH: 34.1 pg — ABNORMAL HIGH (ref 26.6–33.0)
MCHC: 34 g/dL (ref 31.5–35.7)
MCV: 100 fL — ABNORMAL HIGH (ref 79–97)
Monocytes Absolute: 0.8 10*3/uL (ref 0.1–0.9)
Monocytes: 11 %
NEUTROS ABS: 5.2 10*3/uL (ref 1.4–7.0)
NEUTROS PCT: 65 %
Platelets: 190 10*3/uL (ref 150–379)
RBC: 4.46 x10E6/uL (ref 3.77–5.28)
RDW: 12.8 % (ref 12.3–15.4)
WBC: 7.9 10*3/uL (ref 3.4–10.8)

## 2015-11-22 LAB — COMPREHENSIVE METABOLIC PANEL
ALK PHOS: 86 IU/L (ref 39–117)
ALT: 24 IU/L (ref 0–32)
AST: 20 IU/L (ref 0–40)
Albumin/Globulin Ratio: 1.5 (ref 1.2–2.2)
Albumin: 4.6 g/dL (ref 3.5–5.5)
BUN / CREAT RATIO: 12 (ref 9–23)
BUN: 10 mg/dL (ref 6–24)
Bilirubin Total: 0.2 mg/dL (ref 0.0–1.2)
CO2: 25 mmol/L (ref 18–29)
Calcium: 9.7 mg/dL (ref 8.7–10.2)
Chloride: 100 mmol/L (ref 96–106)
Creatinine, Ser: 0.81 mg/dL (ref 0.57–1.00)
GFR calc Af Amer: 101 mL/min/{1.73_m2} (ref >59)
GFR calc non Af Amer: 87 mL/min/{1.73_m2} (ref >59)
Globulin, Total: 3 g/dL (ref 1.5–4.5)
Glucose: 98 mg/dL (ref 65–99)
Potassium: 5.5 mmol/L — ABNORMAL HIGH (ref 3.5–5.2)
Sodium: 141 mmol/L (ref 134–144)
TOTAL PROTEIN: 7.6 g/dL (ref 6.0–8.5)

## 2015-11-22 NOTE — Telephone Encounter (Signed)
Per Dr Marjory Lies, called patient and LVM informing patient her labs results are unremarkable labs, except potassium still slightly high. Advised she needs to follow elevated potassium up with her PCP. Otherwise Dr Marjory Lies will  Continue with her current treatment plan. Left name, number for any questions.

## 2015-11-30 ENCOUNTER — Ambulatory Visit
Admission: RE | Admit: 2015-11-30 | Discharge: 2015-11-30 | Disposition: A | Discharge status: Home / Self Care | Payer: Medicare Other | Source: Ambulatory Visit | Attending: Diagnostic Neuroimaging | Admitting: Diagnostic Neuroimaging

## 2015-11-30 ENCOUNTER — Ambulatory Visit
Admission: RE | Admit: 2015-11-30 | Discharge: 2015-11-30 | Disposition: A | Discharge status: Home / Self Care | Payer: Commercial Managed Care - HMO | Source: Ambulatory Visit | Attending: Diagnostic Neuroimaging | Admitting: Diagnostic Neuroimaging

## 2015-11-30 DIAGNOSIS — R413 Other amnesia: Secondary | ICD-10-CM

## 2015-11-30 DIAGNOSIS — R269 Unspecified abnormalities of gait and mobility: Secondary | ICD-10-CM

## 2015-11-30 DIAGNOSIS — G35 Multiple sclerosis: Secondary | ICD-10-CM

## 2015-11-30 MED ORDER — GADOBENATE DIMEGLUMINE 529 MG/ML IV SOLN: 17.0000 mL | Freq: Once | INTRAVENOUS | Status: DC | PRN

## 2015-12-05 ENCOUNTER — Telehealth: Payer: Self-pay | Admitting: *Deleted

## 2015-12-05 NOTE — Telephone Encounter (Signed)
Received call back from patient, and per Dr Vickey Huger,  informed her that MRI brain results, when compared to the study dated 04/26/2015, there are no new lesions, no acute findings. Informed her that her MRI cervical spine showed some mild disk degeneration, spinal cord is normal and no acute findings. She verbalized understanding, appreciation of call.

## 2015-12-05 NOTE — Telephone Encounter (Signed)
LVM requesting call back for results re: MRI brain, cervical spine. Left name, number.

## 2016-01-12 ENCOUNTER — Telehealth: Payer: Self-pay | Admitting: Diagnostic Neuroimaging

## 2016-01-12 NOTE — Telephone Encounter (Signed)
[employer] called requesting to know if patient had Ocrevus infusion and when the next Ocrevus infuion will be. Please call to advise.

## 2016-01-13 NOTE — Telephone Encounter (Signed)
Spoke with Inetta Fermo, infusion RN who stated patient has not received Ocrevus infusion yet due to insurance issue. Patient has two insurances, and each one has her surname listed differently. Patient is to be in the process of correcting that situation and letting Inetta Fermo know. Inetta Fermo will then have to wait on insurance clearance. Spoke with Abran [facility] [employer] and advised her of above. She stated she would note that and they will get back in touch at a later time for updates. She verbalized understanding, appreciation for call back.

## 2016-02-22 ENCOUNTER — Encounter: Payer: Self-pay | Admitting: Diagnostic Neuroimaging

## 2016-02-22 ENCOUNTER — Ambulatory Visit (INDEPENDENT_AMBULATORY_CARE_PROVIDER_SITE_OTHER): Payer: Medicare Other | Admitting: Diagnostic Neuroimaging

## 2016-02-22 VITALS — BP 120/82 | HR 67 | Ht 67.0 in | Wt 197.0 lb

## 2016-02-22 DIAGNOSIS — G35 Multiple sclerosis: Secondary | ICD-10-CM | POA: Diagnosis not present

## 2016-02-22 DIAGNOSIS — Z79899 Other long term (current) drug therapy: Secondary | ICD-10-CM
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
## 2016-02-23 LAB — CBC WITH DIFFERENTIAL/PLATELET
BASOS: 1 %
Basophils Absolute: 0 10*3/uL (ref 0.0–0.2)
EOS (ABSOLUTE): 0.2 10*3/uL (ref 0.0–0.4)
EOS: 3 %
HEMATOCRIT: 40.2 % (ref 34.0–46.6)
HEMOGLOBIN: 13.7 g/dL (ref 11.1–15.9)
Immature Grans (Abs): 0 10*3/uL (ref 0.0–0.1)
Immature Granulocytes: 0 %
LYMPHS ABS: 2.1 10*3/uL (ref 0.7–3.1)
Lymphs: 28 %
MCH: 33.6 pg — ABNORMAL HIGH (ref 26.6–33.0)
MCHC: 34.1 g/dL (ref 31.5–35.7)
MCV: 99 fL — ABNORMAL HIGH (ref 79–97)
MONOCYTES: 12 %
Monocytes Absolute: 0.9 10*3/uL (ref 0.1–0.9)
Neutrophils Absolute: 4.3 10*3/uL (ref 1.4–7.0)
Neutrophils: 56 %
Platelets: 218 10*3/uL (ref 150–379)
RBC: 4.08 x10E6/uL (ref 3.77–5.28)
RDW: 12.4 % (ref 12.3–15.4)
WBC: 7.5 10*3/uL (ref 3.4–10.8)

## 2016-02-23 LAB — COMPREHENSIVE METABOLIC PANEL
ALBUMIN: 4.3 g/dL (ref 3.5–5.5)
ALK PHOS: 83 IU/L (ref 39–117)
ALT: 18 IU/L (ref 0–32)
AST: 17 IU/L (ref 0–40)
Albumin/Globulin Ratio: 1.5 (ref 1.2–2.2)
BUN / CREAT RATIO: 23 (ref 9–23)
BUN: 19 mg/dL (ref 6–24)
Bilirubin Total: 0.3 mg/dL (ref 0.0–1.2)
CALCIUM: 9.4 mg/dL (ref 8.7–10.2)
CO2: 25 mmol/L (ref 18–29)
Chloride: 102 mmol/L (ref 96–106)
Creatinine, Ser: 0.81 mg/dL (ref 0.57–1.00)
GFR calc Af Amer: 100 mL/min/{1.73_m2} (ref >59)
GFR, EST NON AFRICAN AMERICAN: 87 mL/min/{1.73_m2} (ref >59)
GLOBULIN, TOTAL: 2.9 g/dL (ref 1.5–4.5)
Glucose: 117 mg/dL — ABNORMAL HIGH (ref 65–99)
Potassium: 4.6 mmol/L (ref 3.5–5.2)
SODIUM: 139 mmol/L (ref 134–144)
Total Protein: 7.2 g/dL (ref 6.0–8.5)

## 2016-02-27 ENCOUNTER — Telehealth: Payer: Self-pay | Admitting: *Deleted

## 2016-02-27 NOTE — Telephone Encounter (Signed)
LVM informing patient, per Dr Marjory Lies., her lab results are unremarkable. Dr Marjory Lies will continue with her current treatment plan. Left name ,number for any  questions.

## 2016-06-25 ENCOUNTER — Ambulatory Visit: Payer: Medicare Other | Admitting: Diagnostic Neuroimaging

## 2016-07-20 ENCOUNTER — Ambulatory Visit: Payer: Medicare Other | Admitting: Diagnostic Neuroimaging

## 2016-07-31 ENCOUNTER — Encounter: Payer: Self-pay | Admitting: Diagnostic Neuroimaging

## 2016-07-31 ENCOUNTER — Ambulatory Visit (INDEPENDENT_AMBULATORY_CARE_PROVIDER_SITE_OTHER): Payer: Medicare Other | Admitting: Diagnostic Neuroimaging

## 2016-07-31 VITALS — BP 122/80 | HR 69 | Wt 193.0 lb

## 2016-07-31 DIAGNOSIS — M79605 Pain in left leg: Secondary | ICD-10-CM | POA: Diagnosis not present

## 2016-07-31 DIAGNOSIS — M79604 Pain in right leg: Secondary | ICD-10-CM

## 2016-07-31 DIAGNOSIS — R413 Other amnesia: Secondary | ICD-10-CM | POA: Diagnosis not present

## 2016-07-31 DIAGNOSIS — R269 Unspecified abnormalities of gait and mobility: Secondary | ICD-10-CM | POA: Diagnosis not present

## 2016-07-31 DIAGNOSIS — G35 Multiple sclerosis: Secondary | ICD-10-CM

## 2016-07-31 DIAGNOSIS — Z79899 Other long term (current) drug therapy: Secondary | ICD-10-CM

## 2016-07-31 MED ORDER — KETOROLAC TROMETHAMINE 30 MG/ML IJ SOLN
30.0000 mg | Freq: Once | INTRAMUSCULAR | Status: AC
  Administered 2016-07-31: 30 mg via INTRAMUSCULAR

## 2016-07-31 NOTE — Progress Notes (Signed)
Per Dr Marjory Lies, checked patient's ID per protocol. Administered ordered Toradol 30 mg IM, left deltoid. Patient tolerated well.  Used available 60 mg/2 mL bottle, wasted 30 mg/ 1 mL, witnessed by Roxy Cedar, RN.
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
## 2016-07-31 NOTE — Addendum Note (Signed)
Addended byJoycelyn Schmid on: 07/31/2016 03:17 PM   Modules accepted: Orders

## 2016-08-01 LAB — COMPREHENSIVE METABOLIC PANEL
ALBUMIN: 4.1 g/dL (ref 3.5–5.5)
ALK PHOS: 74 IU/L (ref 39–117)
ALT: 16 IU/L (ref 0–32)
AST: 19 IU/L (ref 0–40)
Albumin/Globulin Ratio: 1.5 (ref 1.2–2.2)
BUN / CREAT RATIO: 15 (ref 9–23)
BUN: 14 mg/dL (ref 6–24)
Bilirubin Total: <0.2 mg/dL (ref 0.0–1.2)
CO2: 26 mmol/L (ref 18–29)
CREATININE: 0.93 mg/dL (ref 0.57–1.00)
Calcium: 9.3 mg/dL (ref 8.7–10.2)
Chloride: 105 mmol/L (ref 96–106)
GFR calc non Af Amer: 73 mL/min/{1.73_m2} (ref >59)
GFR, EST AFRICAN AMERICAN: 85 mL/min/{1.73_m2} (ref >59)
GLUCOSE: 98 mg/dL (ref 65–99)
Globulin, Total: 2.7 g/dL (ref 1.5–4.5)
Potassium: 5.2 mmol/L (ref 3.5–5.2)
Sodium: 143 mmol/L (ref 134–144)
TOTAL PROTEIN: 6.8 g/dL (ref 6.0–8.5)

## 2016-08-01 LAB — CBC WITH DIFFERENTIAL/PLATELET
BASOS ABS: 0 10*3/uL (ref 0.0–0.2)
Basos: 0 %
EOS (ABSOLUTE): 0.4 10*3/uL (ref 0.0–0.4)
EOS: 4 %
HEMOGLOBIN: 13.9 g/dL (ref 11.1–15.9)
Hematocrit: 41.2 % (ref 34.0–46.6)
IMMATURE GRANS (ABS): 0 10*3/uL (ref 0.0–0.1)
Immature Granulocytes: 0 %
LYMPHS: 25 %
Lymphocytes Absolute: 2.2 10*3/uL (ref 0.7–3.1)
MCH: 33.6 pg — ABNORMAL HIGH (ref 26.6–33.0)
MCHC: 33.7 g/dL (ref 31.5–35.7)
MCV: 100 fL — ABNORMAL HIGH (ref 79–97)
MONOCYTES: 8 %
Monocytes Absolute: 0.7 10*3/uL (ref 0.1–0.9)
NEUTROS PCT: 63 %
Neutrophils Absolute: 5.5 10*3/uL (ref 1.4–7.0)
Platelets: 190 10*3/uL (ref 150–379)
RBC: 4.14 x10E6/uL (ref 3.77–5.28)
RDW: 12.8 % (ref 12.3–15.4)
WBC: 8.8 10*3/uL (ref 3.4–10.8)

## 2016-08-03 ENCOUNTER — Telehealth: Payer: Self-pay | Admitting: *Deleted

## 2016-08-03 NOTE — Telephone Encounter (Signed)
Per Dr Marjory Lies, spoke with patient's boyfriend, Trey Paula, on [place] (patient asleep) and informed him her lab results are unremarkable. Dr Marjory Lies will continue with her current treatment plan. He verbalized understanding, appreciation, and he will inform patient.

## 2016-08-17 ENCOUNTER — Ambulatory Visit: Payer: Medicare Other | Attending: Diagnostic Neuroimaging

## 2016-08-17 DIAGNOSIS — M79662 Pain in left lower leg: Secondary | ICD-10-CM | POA: Diagnosis present

## 2016-08-17 DIAGNOSIS — R2689 Other abnormalities of gait and mobility: Secondary | ICD-10-CM

## 2016-08-17 DIAGNOSIS — M6281 Muscle weakness (generalized): Secondary | ICD-10-CM

## 2016-08-17 DIAGNOSIS — M79604 Pain in right leg: Secondary | ICD-10-CM | POA: Diagnosis present

## 2016-08-17 DIAGNOSIS — R2681 Unsteadiness on feet: Secondary | ICD-10-CM | POA: Diagnosis present

## 2016-08-17 NOTE — Therapy (Signed)
[facility] [address] [facility] [address] [place], [place], [address] Phone: [phone]   Fax:  [phone]  Physical Therapy Evaluation  Patient Details  Name: [name] MRN: [id number] Date of Birth: [birth year] Referring Provider: Dr. Marjory Lies  Encounter Date: 48/10/2016      PT End of Session - 08/17/16 1421    Visit Number 1   Number of Visits 9   Date for PT Re-Evaluation 09/16/16   Authorization Type Medicare and UHC: G-CODE AND PROGRESS NOTE EVERY 10TH VISIT.    PT Start Time 1321   PT Stop Time 1402   PT Time Calculation (min) 41 min   Equipment Utilized During Treatment Gait belt   Activity Tolerance Patient tolerated treatment well   Behavior During Therapy WFL for tasks assessed/performed  pt with intermittent crying during subjective      Past Medical History:  Diagnosis Date  . Anxiety and depression   . Arthritis   . Asthma   . Chronic pain    back,legs,neck  . Complication of anesthesia    cries  . Depression   . Fibromyalgia   . Gastroenteritis, acute    w/ dehydration  . Hypothyroidism   . Migraine headache   . Multiple allergies   . Multiple sclerosis (HCC)   . Neuromuscular disorder (HCC)    neuropathy pain  . Ovarian mass November 11 2012   left   . Rheumatic disease     Past Surgical History:  Procedure Laterality Date  . BUNIONECTOMY     both feet  . CARPAL TUNNEL RELEASE Left 10/28/2012   Procedure: CARPAL TUNNEL RELEASE;  Surgeon: Nicki Reaper, MD;  Location: [facility];  Service: Orthopedics;  Laterality: Left;  . CHOLECYSTECTOMY  09/2014   in [place], portion of liver removed- cyst  . COLONOSCOPY W/ POLYPECTOMY  2012  . DILATION AND CURETTAGE OF UTERUS    . ELBOW ARTHROSCOPY     rt  . ELBOW SURGERY Right may 2014   tendon rair  . FOOT NEUROMA SURGERY     both feet  . HERNIA REPAIR  05/2015   umbilical  . MYOMECTOMY ABDOMINAL APPROACH    . OPEN REDUCTION INTERNAL  FIXATION (ORIF) DISTAL RADIAL FRACTURE Left 10/28/2012   Procedure: OPEN REDUCTION INTERNAL FIXATION (ORIF) DISTAL RADIAL FRACTURE;  Surgeon: Nicki Reaper, MD;  Location: [facility];  Service: Orthopedics;  Laterality: Left;  . OVARY SURGERY     rt 1987  . ROTATOR CUFF REPAIR  2000   rt  . TONSILLECTOMY    . WRIST SURGERY Right 2006    There were no vitals filed for this visit.       Subjective Assessment - 08/17/16 1326    Subjective Pt presenting to [facility] neuro with MS, balance issues, and BLE pain. Pt had radiofrequency ablation on lower lumbar spine and neck in January and February of 2018 for back/neck/LE pain, this helped relieve the pain a little in her legs. Pt reports weakness and heaviness in BLEs. Dr. Marjory Lies told pt that he believes LE pain is not related to neck/back issues, but caused from the MS. Pt states that RLE "locks up" and she can't move it, so her boyfriend has to help her with RLE. BLE issues has been going on for 3-4 months, pt recently switched from Gabapentin to Lyrica. Pt states Lyrica is making her anxious and sad, and is seeing a pyschologist.    Pertinent History Anxiety, MS,  fibromyalgia (per pt), RA (per pt), asthma, hypothyroidism, pt had gullbladder and hernia surgery in 2017-no precautions per pt   Limitations Sitting;Standing   How long can you sit comfortably? 20-30 minutes   How long can you stand comfortably? 5 minutes   Patient Stated Goals Relieve some pain and strength my legs, try to get "the wobbliness out".    Currently in Pain? Yes   Pain Score 9    Pain Location Back   Pain Orientation Lower   Pain Descriptors / Indicators Aching;Throbbing;Other (Comment)  feels like things are moving/twisting   Pain Type Chronic pain   Pain Radiating Towards to legs   Pain Onset More than a month ago   Pain Frequency Constant   Aggravating Factors  vacuuming (household chores), being in one position for prolonged periods of time  (sit/standing)   Pain Relieving Factors water therapy, rest, and heat            [facility] PT Assessment - 08/17/16 1340      Assessment   Medical Diagnosis MS, Gait difficulty, Pain in both LEs   Referring Provider Dr. Marjory Lies   Onset Date/Surgical Date 05/14/16   Hand Dominance Right   Prior Therapy none     Precautions   Precautions Fall   Precaution Comments based on hx and FGA score     Restrictions   Weight Bearing Restrictions No   Other Position/Activity Restrictions --     Balance Screen   Has the patient fallen in the past 6 months Yes   How many times? 4   Has the patient had a decrease in activity level because of a fear of falling?  No   Is the patient reluctant to leave their home because of a fear of falling?  No     Home Environment   Living Environment Private residence   Living Arrangements Spouse/significant other  Jeff-boyfriend   Available Help at Discharge Family  boyfriend and her brother live with pt (brother with CA)   Type of Home House   Home Access Stairs to enter   Entrance Stairs-Number of Steps 8   Entrance Stairs-Rails None   Home Layout Two level   Alternate Level Stairs-Number of Steps 15   Alternate Level Stairs-Rails Left   Home Equipment Walker - 4 wheels;Grab bars - tub/shower;Shower seat;Other (comment)  hand ladder in bed     Prior Function   Level of Independence Independent   Vocation On disability   Leisure Four wheeling, swim, hang out with friends     Cognition   Overall Cognitive Status Impaired/Different from baseline  per pt   Area of Impairment Memory  per pt   Memory Impaired   Memory Impairment Decreased short term memory  per pt     Sensation   Light Touch Impaired by gross assessment   Additional Comments Pt reports N/T in R hand and decr. light touch in R hand.     Coordination   Gross Motor Movements are Fluid and Coordinated Yes   Fine Motor Movements are Fluid and Coordinated No  decr. speed  during finger to thumb test, RAMs WNL   Heel Shin Test B LE WNL      Posture/Postural Control   Posture/Postural Control --     Tone   Assessment Location --  not tested but pt reported intermittent "Locking up" of LEs     ROM / Strength   AROM / PROM / Strength AROM;Strength  AROM   Overall AROM  Within functional limits for tasks performed   Overall AROM Comments B UE/LE AROM WFL     Strength   Overall Strength Deficits   Overall Strength Comments B UE strength WFL. LLE grossly: 4/5 to 4+/5. RLLE hip flex: 4-/5, knee ext: 4-/5, knee flexion: 4-/5, ankle DF: 4/5. Seated B hip abd/add: 4/5. B hip ext not formally tested but weakness suspected 2/2 gait deivations.      Transfers   Transfers Sit to Stand;Stand to Sit   Sit to Stand 6: Modified independent (Device/Increase time);With upper extremity assist;From chair/3-in-1   Stand to Sit 6: Modified independent (Device/Increase time);With upper extremity assist;To chair/3-in-1     Ambulation/Gait   Ambulation/Gait Yes   Ambulation/Gait Assistance 5: Supervision   Ambulation/Gait Assistance Details Pt amb. in guarded manner and reported some dizziness after walking with eyes closed during FGA.   Ambulation Distance (Feet) 300 Feet   Assistive device None   Gait Pattern Step-through pattern;Decreased stride length;Decreased arm swing - right;Decreased arm swing - left   Ambulation Surface Level;Indoor   Gait velocity 3.66ft/sec.  no AD     Functional Gait  Assessment   Gait assessed  Yes   Gait Level Surface Walks 20 ft in less than 7 sec but greater than 5.5 sec, uses assistive device, slower speed, mild gait deviations, or deviates 6-10 in outside of the 12 in walkway width.  6.6 sec.   Change in Gait Speed Able to change speed, demonstrates mild gait deviations, deviates 6-10 in outside of the 12 in walkway width, or no gait deviations, unable to achieve a major change in velocity, or uses a change in velocity, or uses an  assistive device.   Gait with Horizontal Head Turns Performs head turns smoothly with slight change in gait velocity (eg, minor disruption to smooth gait path), deviates 6-10 in outside 12 in walkway width, or uses an assistive device.   Gait with Vertical Head Turns Performs task with slight change in gait velocity (eg, minor disruption to smooth gait path), deviates 6 - 10 in outside 12 in walkway width or uses assistive device   Gait and Pivot Turn Pivot turns safely within 3 sec and stops quickly with no loss of balance.   Step Over Obstacle Is able to step over 2 stacked shoe boxes taped together (9 in total height) without changing gait speed. No evidence of imbalance.   Gait with Narrow Base of Support Ambulates 4-7 steps.  5 steps   Gait with Eyes Closed Walks 20 ft, slow speed, abnormal gait pattern, evidence for imbalance, deviates 10-15 in outside 12 in walkway width. Requires more than 9 sec to ambulate 20 ft.  11.2 sec.   Ambulating Backwards Walks 20 ft, uses assistive device, slower speed, mild gait deviations, deviates 6-10 in outside 12 in walkway width.   Steps Alternating feet, no rail.   Total Score 21                           PT Education - 08/17/16 1421    Education provided Yes   Education Details PT discussed exam findings, outcome measures, PT POC and frequency/duration.    Person(s) Educated Patient   Methods Explanation   Comprehension Verbalized understanding          PT Short Term Goals - 08/17/16 1427      PT SHORT TERM GOAL #1   Title same as  LTGs           PT Long Term Goals - Aug 27, 2016 1427      PT LONG TERM GOAL #1   Title Pt will be IND in HEP in order to improve strength, balance, pain, and flexibility. TARGET DATE FOR ALL LTGS: 09/14/16   Status New     PT LONG TERM GOAL #2   Title Pt will improve FGA score to >/=29/30 to decr. falls risk.    Status New     PT LONG TERM GOAL #3   Title Pt will report no falls over  the last 2 weeks to improve safety during ADLs.    Status New     PT LONG TERM GOAL #4   Title Pt will tolerated dynamic standing balance activities for >/=10 minutes, with pain </=7/10, to improve quality of life and safety during ADLs.    Status New     PT LONG TERM GOAL #5   Title Pt will amb. 1000' over even/uneven terrain, IND, in order to improve functional mobility.    Status New               Plan - August 27, 2016 1423    Clinical Impression Statement Pt is a pleasant 46 y/o female presenting to [facility] neuro with hx of MS, BLE pain, and imbalance. Pt's PMH significant for the following: Anxiety, MS, fibromyalgia (per pt), RA (per pt), asthma, hypothyroidism, pt had gullbladder and hernia surgery in 2017-no precautions per pt. Pt lives with her boyfriend, and her brother who has Stage III colorectal CA. Pt takes care of her brother. The following deficits were noted during pt exam: gait deviations, decr. strength, impaired coordination, impaired balance, pain, impaired sensation, and impaired flexibility. Pt's gait speed is WNL. Pt's FGA score indicates pt is at a moderate risk for falls. Pt would benefit from skilled PT to improve safety during functional mobility.    Rehab Potential Good   Clinical Impairments Affecting Rehab Potential hx of MS   PT Frequency 2x / week   PT Duration 4 weeks   PT Treatment/Interventions ADLs/Self Care Home Management;Biofeedback;Canalith Repostioning;Electrical Stimulation;Neuromuscular re-education;Cognitive remediation;Balance training;Therapeutic exercise;Therapeutic activities;Manual techniques;Functional mobility training;Stair training;Gait training;DME Instruction;Orthotic Fit/Training;Vestibular;Patient/family education   PT Next Visit Plan Initiate balance, strengthening, and flexibility HEP   Consulted and Agree with Plan of Care Patient      Patient will benefit from skilled therapeutic intervention in order to improve the following  deficits and impairments:  Abnormal gait, Decreased endurance, Decreased mobility, Decreased balance, Decreased coordination, Impaired flexibility, Decreased cognition, Dizziness, Decreased strength, Impaired sensation  Visit Diagnosis: Other abnormalities of gait and mobility - Plan: PT plan of care cert/re-cert  Pain in left lower leg - Plan: PT plan of care cert/re-cert  Pain in right leg - Plan: PT plan of care cert/re-cert  Unsteadiness on feet - Plan: PT plan of care cert/re-cert  Muscle weakness (generalized) - Plan: PT plan of care cert/re-cert      G-Codes - 2016-08-27 1430    Functional Assessment Tool Used (Outpatient Only) FGA: 21/30   Functional Limitation Mobility: Walking and moving around   Mobility: Walking and Moving Around Current Status ([id number]) At least 40 percent but less than 60 percent impaired, limited or restricted   Mobility: Walking and Moving Around Goal Status [phone]) At least 1 percent but less than 20 percent impaired, limited or restricted       Problem List Patient Active Problem List   Diagnosis Date Noted  .  Anxiety state, unspecified 01/07/2013  . Multiple sclerosis (HCC) 09/17/2012    Miller,Jennifer L 08/17/2016, 2:32 PM  [facility] [address] [place], [place], [address] Phone: [phone]   Fax:  [phone]  Name: Meryem Haertel MRN: [id number] Date of Birth: 21-Jul-1970  Zerita Boers, PT,DPT 08/17/16 2:32 PM Phone: [phone] Fax: [phone]

## 2016-08-29 ENCOUNTER — Ambulatory Visit: Payer: Medicare Other

## 2016-08-31 ENCOUNTER — Ambulatory Visit: Payer: Medicare Other | Admitting: Physical Therapy

## 2016-09-05 ENCOUNTER — Ambulatory Visit: Payer: Medicare Other

## 2016-09-05 DIAGNOSIS — R2681 Unsteadiness on feet: Secondary | ICD-10-CM

## 2016-09-05 DIAGNOSIS — M6281 Muscle weakness (generalized): Secondary | ICD-10-CM

## 2016-09-05 DIAGNOSIS — M79604 Pain in right leg: Secondary | ICD-10-CM

## 2016-09-05 DIAGNOSIS — R2689 Other abnormalities of gait and mobility: Secondary | ICD-10-CM | POA: Diagnosis not present

## 2016-09-05 DIAGNOSIS — M79662 Pain in left lower leg: Secondary | ICD-10-CM

## 2016-09-05 NOTE — Therapy (Signed)
[facility] [address] [place], [place], [address] Phone: [phone]   Fax:  [phone]  Physical Therapy Treatment  Patient Details  Name: [name] MRN: [id number] Date of Birth: February 28, 1971 Referring Provider: Dr. Marjory Lies  Encounter Date: 09/05/2016      PT End of Session - 09/05/16 1407    Visit Number 2   Number of Visits 9   Date for PT Re-Evaluation 09/16/16   Authorization Type Medicare and UHC: G-CODE AND PROGRESS NOTE EVERY 10TH VISIT.    PT Start Time 1321  pt late   PT Stop Time 1404   PT Time Calculation (min) 43 min   Equipment Utilized During Treatment --  S prn   Activity Tolerance Patient tolerated treatment well   Behavior During Therapy WFL for tasks assessed/performed      Past Medical History:  Diagnosis Date  . Anxiety and depression   . Arthritis   . Asthma   . Chronic pain    back,legs,neck  . Complication of anesthesia    cries  . Depression   . Fibromyalgia   . Gastroenteritis, acute    w/ dehydration  . Hypothyroidism   . Migraine headache   . Multiple allergies   . Multiple sclerosis (HCC)   . Neuromuscular disorder (HCC)    neuropathy pain  . Ovarian mass November 11 2012   left   . Rheumatic disease     Past Surgical History:  Procedure Laterality Date  . BUNIONECTOMY     both feet  . CARPAL TUNNEL RELEASE Left 10/28/2012   Procedure: CARPAL TUNNEL RELEASE;  Surgeon: Nicki Reaper, MD;  Location: [facility];  Service: Orthopedics;  Laterality: Left;  . CHOLECYSTECTOMY  09/2014   in [place], portion of liver removed- cyst  . COLONOSCOPY W/ POLYPECTOMY  2012  . DILATION AND CURETTAGE OF UTERUS    . ELBOW ARTHROSCOPY     rt  . ELBOW SURGERY Right may 2014   tendon rair  . FOOT NEUROMA SURGERY     both feet  . HERNIA REPAIR  05/2015   umbilical  . MYOMECTOMY ABDOMINAL APPROACH    . OPEN REDUCTION INTERNAL FIXATION (ORIF) DISTAL RADIAL  FRACTURE Left 10/28/2012   Procedure: OPEN REDUCTION INTERNAL FIXATION (ORIF) DISTAL RADIAL FRACTURE;  Surgeon: Nicki Reaper, MD;  Location: [facility];  Service: Orthopedics;  Laterality: Left;  . OVARY SURGERY     rt 1987  . ROTATOR CUFF REPAIR  2000   rt  . TONSILLECTOMY    . WRIST SURGERY Right 2006    There were no vitals filed for this visit.      Subjective Assessment - 09/05/16 1323    Subjective Pt denied falls or changes since last visit.    Pertinent History Anxiety, MS, fibromyalgia (per pt), RA (per pt), asthma, hypothyroidism, pt had gullbladder and hernia surgery in 2017-no precautions per pt   Patient Stated Goals Relieve some pain and strength my legs, try to get "the wobbliness out".    Currently in Pain? Yes   Pain Score 7    Pain Location Back  back/legs and hands   Pain Orientation Lower   Pain Descriptors / Indicators Aching;Throbbing   Pain Type Chronic pain   Pain Onset More than a month ago   Pain Frequency Constant   Aggravating Factors  activity and walking, she's been really sore since the concert last week    Pain Relieving  Factors rest and heat         Therex: Pt performed stretches in seated and supine positions with cues and demo for technique. Pt denied incr. In pain during stretches or contract/relax technique (hip flexors/hamstrings in supine). Please see pt instructions for details.  Neuro re-ed: Pt performed balance activities in // bars with 1 UE support and S for safety. Please see pt instructions for details.                         PT Education - 09/05/16 1406    Education provided Yes   Education Details PT provided pt with flexibility and balance HEP. PT also educated pt on proper way to txf in/out of bed to reduce back pain. PT also educated pt on contract/relax (hip flexors/hamstrings) technique to reduce LE pain.    Person(s) Educated Patient   Methods Explanation;Demonstration;Verbal  cues;Handout;Tactile cues   Comprehension Returned demonstration;Verbalized understanding          PT Short Term Goals - 08/17/16 1427      PT SHORT TERM GOAL #1   Title same as LTGs           PT Long Term Goals - 08/17/16 1427      PT LONG TERM GOAL #1   Title Pt will be IND in HEP in order to improve strength, balance, pain, and flexibility. TARGET DATE FOR ALL LTGS: 09/14/16   Status New     PT LONG TERM GOAL #2   Title Pt will improve FGA score to >/=29/30 to decr. falls risk.    Status New     PT LONG TERM GOAL #3   Title Pt will report no falls over the last 2 weeks to improve safety during ADLs.    Status New     PT LONG TERM GOAL #4   Title Pt will tolerated dynamic standing balance activities for >/=10 minutes, with pain </=7/10, to improve quality of life and safety during ADLs.    Status New     PT LONG TERM GOAL #5   Title Pt will amb. 1000' over even/uneven terrain, IND, in order to improve functional mobility.    Status New               Plan - 09/05/16 1409    Clinical Impression Statement Today's skilled session focused on initiating flexibility and balance HEP. Pt tolerated HEP well and denied incr. in pain during session. Pt reported decr. pain after performing contract/relax technique in supine. Pt continues to experience incr. postural sway during activities which require incr. vestibular input. Pt would continue to benefit from skilled PT to improve safety during functional mobility.    Rehab Potential Good   Clinical Impairments Affecting Rehab Potential hx of MS   PT Frequency 2x / week   PT Duration 4 weeks   PT Treatment/Interventions ADLs/Self Care Home Management;Biofeedback;Canalith Repostioning;Electrical Stimulation;Neuromuscular re-education;Cognitive remediation;Balance training;Therapeutic exercise;Therapeutic activities;Manual techniques;Functional mobility training;Stair training;Gait training;DME Instruction;Orthotic  Fit/Training;Vestibular;Patient/family education   PT Next Visit Plan Initiate strengthening HEP, add contract/relax to HEP prn. Gait training over compliant surfaces   Consulted and Agree with Plan of Care Patient      Patient will benefit from skilled therapeutic intervention in order to improve the following deficits and impairments:  Abnormal gait, Decreased endurance, Decreased mobility, Decreased balance, Decreased coordination, Impaired flexibility, Decreased cognition, Dizziness, Decreased strength, Impaired sensation  Visit Diagnosis: Other abnormalities of gait and mobility  Pain  in left lower leg  Pain in right leg  Unsteadiness on feet  Muscle weakness (generalized)     Problem List Patient Active Problem List   Diagnosis Date Noted  . Anxiety state, unspecified 01/07/2013  . Multiple sclerosis (HCC) 09/17/2012    [name] L 09/05/2016, 2:13 PM  [facility] [address] [place], [place], [address] Phone: [phone]   Fax:  [phone]  Name: [name] MRN: [id number] Date of Birth: [birth year]  Zerita Boers, PT,DPT 09/05/16 2:14 PM Phone: [phone] Fax: [phone]

## 2016-09-05 NOTE — Patient Instructions (Addendum)
HIP: Hamstrings - Short Sitting    Rest leg on raised surface. Keep knee straight. Lift chest. Hold __30_ seconds. Repeat with other leg.  __3_ reps per set, _2-3__ sets per day, __7_ days per week  Copyright  VHI. All rights reserved.   Piriformis Stretch, Sitting    Sit, one ankle on opposite knee, same-side hand on crossed knee. Push down on knee, keeping spine straight. Lean torso forward, with flat back, until tension is felt in hamstrings and gluteals of crossed-leg side. Hold _30__ seconds. Repeat with other leg. Repeat _3__ times per session. Do _2-3__ sessions per day. Perform 7 days a week.   Copyright  VHI. All rights reserved.   Hip Flexor Stretch    Lying on back near edge of bed, bend one leg, foot flat. Hang other leg over edge, relaxed, thigh resting entirely on bed for __2__ minutes. Repeat with other leg. Repeat __1__ times. Do __2-3__ sessions per day. Perform every day. Advanced Exercise: Bend knee back keeping thigh in contact with bed.  http://gt2.exer.us/347   Copyright  VHI. All rights reserved.    Backward    Walk backwards with eyes open. Take 10 even steps, making sure each foot lifts off floor. Repeat for __4__ laps per session. Do __1__ sessions per day.   Copyright  VHI. All rights reserved.  Feet Heel-Toe "Tandem"    Perform at counter, hold counter with one hand, walk a straight line bringing one foot directly in front of the other. Repeat for __4__ laps per session. Do _1___ sessions per day.  Copyright  VHI. All rights reserved.  Walking    Perform at counter, with eyes closed. Walk on solid surface with hand close to support. Attempt to keep hand away from support for longer periods of time, keeping a straight path. Repeat __4__ times along counter per session. Do __1__ sessions per day.   Copyright  VHI. All rights reserved.  "I love a Licensed conveyancer    March along counter, holding on with one hand, make sure knees  and hips reach 90 degrees. Repeat __4__ times along counter. Do __1__ sessions per day.  http://gt2.exer.us/345   Copyright  VHI. All rights reserved.

## 2016-09-07 ENCOUNTER — Encounter: Payer: Self-pay | Admitting: Physical Therapy

## 2016-09-07 ENCOUNTER — Ambulatory Visit: Payer: Medicare Other | Admitting: Physical Therapy

## 2016-09-07 DIAGNOSIS — M6281 Muscle weakness (generalized): Secondary | ICD-10-CM

## 2016-09-07 DIAGNOSIS — M79662 Pain in left lower leg: Secondary | ICD-10-CM

## 2016-09-07 DIAGNOSIS — M79604 Pain in right leg: Secondary | ICD-10-CM

## 2016-09-07 DIAGNOSIS — R2689 Other abnormalities of gait and mobility: Secondary | ICD-10-CM | POA: Diagnosis not present

## 2016-09-07 DIAGNOSIS — R2681 Unsteadiness on feet: Secondary | ICD-10-CM

## 2016-09-07 NOTE — Therapy (Signed)
[facility] [address] [place], [place], [address] Phone: [phone]   Fax:  [phone]  Physical Therapy Treatment  Patient Details  Name: [name] MRN: [id number] Date of Birth: 04/27/1971 Referring Provider: Dr. Marjory Lies  Encounter Date: 09/07/2016      PT End of Session - 09/07/16 1224    Visit Number 3   Number of Visits 9   Date for PT Re-Evaluation 09/16/16   Authorization Type Medicare and UHC: G-CODE AND PROGRESS NOTE EVERY 10TH VISIT.    PT Start Time [phone]   PT Stop Time 1015   PT Time Calculation (min) 42 min   Activity Tolerance Patient tolerated treatment well;No increased pain   Behavior During Therapy WFL for tasks assessed/performed      Past Medical History:  Diagnosis Date  . Anxiety and depression   . Arthritis   . Asthma   . Chronic pain    back,legs,neck  . Complication of anesthesia    cries  . Depression   . Fibromyalgia   . Gastroenteritis, acute    w/ dehydration  . Hypothyroidism   . Migraine headache   . Multiple allergies   . Multiple sclerosis (HCC)   . Neuromuscular disorder (HCC)    neuropathy pain  . Ovarian mass November 11 2012   left   . Rheumatic disease     Past Surgical History:  Procedure Laterality Date  . BUNIONECTOMY     both feet  . CARPAL TUNNEL RELEASE Left 10/28/2012   Procedure: CARPAL TUNNEL RELEASE;  Surgeon: Nicki Reaper, MD;  Location: [facility];  Service: Orthopedics;  Laterality: Left;  . CHOLECYSTECTOMY  09/2014   in [place], portion of liver removed- cyst  . COLONOSCOPY W/ POLYPECTOMY  2012  . DILATION AND CURETTAGE OF UTERUS    . ELBOW ARTHROSCOPY     rt  . ELBOW SURGERY Right may 2014   tendon rair  . FOOT NEUROMA SURGERY     both feet  . HERNIA REPAIR  05/2015   umbilical  . MYOMECTOMY ABDOMINAL APPROACH    . OPEN REDUCTION INTERNAL FIXATION (ORIF) DISTAL RADIAL FRACTURE Left 10/28/2012   Procedure: OPEN  REDUCTION INTERNAL FIXATION (ORIF) DISTAL RADIAL FRACTURE;  Surgeon: Nicki Reaper, MD;  Location: [facility];  Service: Orthopedics;  Laterality: Left;  . OVARY SURGERY     rt 1987  . ROTATOR CUFF REPAIR  2000   rt  . TONSILLECTOMY    . WRIST SURGERY Right 2006    There were no vitals filed for this visit.      Subjective Assessment - 09/07/16 0934    Subjective Pt reports going to a [employer] concert last week and standing for a lot of hours and feels like that may have flared up her LE pain; pt feels very tired today with significant LE pain.   Patient is accompained by: Family member   Pertinent History Anxiety, MS, fibromyalgia (per pt), RA (per pt), asthma, hypothyroidism, pt had gullbladder and hernia surgery in 2017-no precautions per pt   Limitations Sitting;Standing   How long can you sit comfortably? 20-30 minutes   How long can you stand comfortably? 5 minutes   Patient Stated Goals Relieve some pain and strength my legs, try to get "the wobbliness out".    Currently in Pain? Yes   Pain Score 8    Pain Location Leg   Pain Orientation Right;Left;Upper   Pain Descriptors /  Indicators Aching;Throbbing;Cramping   Pain Type Neuropathic pain                         [facility] Adult PT Treatment/Exercise - 09/07/16 0930      Exercises   Exercises Lumbar;Knee/Hip     Lumbar Exercises: Quadruped   Madcat/Old Horse 5 reps   Other Quadruped Lumbar Exercises Child's pose stretch with hold for 30 seconds     Knee/Hip Exercises: Stretches   Active Hamstring Stretch Both;3 reps;30 seconds  performed supine with pt's UEs assisting stretch   Hip Flexor Stretch Both;3 reps;30 seconds  peformed in supine; added knee flexion for quad stretch   Piriformis Stretch Both;3 reps;30 seconds  performed supine              Balance Exercises - 09/07/16 1008      Balance Exercises: Standing   Tandem Gait Forward;Intermittent upper extremity  support;Foam/compliant surface;4 reps   Marching Limitations forwards on compliant surface, intermittent UE support x 4 reps with verbal cues for more upright gaze   Other Standing Exercises gait with eyes closed forwards on compliant surface x 4 reps with intermittent UE support           PT Education - 09/07/16 1223    Education provided Yes   Education Details Added stretches to HEP   Person(s) Educated Patient;Spouse   Methods Explanation;Demonstration;Handout   Comprehension Verbalized understanding;Returned demonstration    Piriformis Stretch, Supine    Lie supine, folded towel under sacrum, one ankle crossed onto opposite knee. Holding bottom leg behind knee, gently pull legs toward chest and roll toward top-leg side. Feel stretch in hip or pelvic region. Hold _30__ seconds.  Repeat _3__ times per session. Do __2_ sessions per day.  Copyright  VHI. All rights reserved.     Lying on back, pull right knee toward opposite shoulder. Hold ____ seconds. Repeat ____ times. Do ____ sessions per day.  Quads / HF, Supine    Lie near edge of bed, one leg bent, foot flat on bed. Other leg hanging over edge, relaxed, thigh resting entirely on bed. Bend hanging knee backward keeping thigh in contact with bed. Hold _30__ seconds.  Repeat _3__ times per session. Do _2__ sessions per day. Relax the leg you are NOT stretching on two pillows. Cat / Cow Flow    Inhale, press spine toward ceiling like a Halloween cat. Keeping strength in arms and abdominals, exhale to soften spine through neutral and into cow pose. Open chest and arch back. Initiate movement between cat and cow at tailbone, one vertebrae at a time. Repeat __10__ times.  Copyright  VHI. All rights reserved.     Copyright  VHI. All rights reserved.  BACK: Child's Pose (Sciatica)    Sit in knee-chest position and reach arms forward. Separate knees for comfort. Hold position for ___ breaths. Repeat ___ times. Do  ___ times per day.  Copyright  VHI. All rights reserved.  Stretching: Hamstring (Supine)    Supporting right thigh behind knee, slowly straighten knee until stretch is felt in back of thigh. Hold __30__ seconds. Repeat __3__ times per set. Do __1__ sets per session. Do __2__ sessions per day. Bend your knee on the leg you are NOT stretching to take pressure off your back.    http://orth.exer.us/657   Copyright  VHI. All rights reserved.       PT Short Term Goals - 08/17/16 1427  PT SHORT TERM GOAL #1   Title same as LTGs           PT Long Term Goals - 08/17/16 1427      PT LONG TERM GOAL #1   Title Pt will be IND in HEP in order to improve strength, balance, pain, and flexibility. TARGET DATE FOR ALL LTGS: 09/14/16   Status New     PT LONG TERM GOAL #2   Title Pt will improve FGA score to >/=29/30 to decr. falls risk.    Status New     PT LONG TERM GOAL #3   Title Pt will report no falls over the last 2 weeks to improve safety during ADLs.    Status New     PT LONG TERM GOAL #4   Title Pt will tolerated dynamic standing balance activities for >/=10 minutes, with pain </=7/10, to improve quality of life and safety during ADLs.    Status New     PT LONG TERM GOAL #5   Title Pt will amb. 1000' over even/uneven terrain, IND, in order to improve functional mobility.    Status New               Plan - 09/07/16 1224    Clinical Impression Statement Due to LE pain continued to focus on stretching program for bilat LE in supine and quadruped to include stretches for spine.  While performing stretches on one LE, moist heat applied to anterior portion of contralateral LE for pain management.  Boyfriend observed all stretches.  Boyfriend reports they are going to a race this weekend with lots of walking; advised pt to take stretches with her and perform at the hotel.  Progressed balance exercises from HEP by performing each on compliant mat with intermittent UE  support.  At end of session pt reported decrease in pain to 7/10.     Rehab Potential Good   Clinical Impairments Affecting Rehab Potential hx of MS   PT Treatment/Interventions ADLs/Self Care Home Management;Biofeedback;Canalith Repostioning;Electrical Stimulation;Neuromuscular re-education;Cognitive remediation;Balance training;Therapeutic exercise;Therapeutic activities;Manual techniques;Functional mobility training;Stair training;Gait training;DME Instruction;Orthotic Fit/Training;Vestibular;Patient/family education   PT Next Visit Plan quadruped, tall kneeling strengthening and stretches/core work, Location manager on compliant surfaces   Consulted and Agree with Plan of Care Patient      Patient will benefit from skilled therapeutic intervention in order to improve the following deficits and impairments:  Abnormal gait, Decreased endurance, Decreased mobility, Decreased balance, Decreased coordination, Impaired flexibility, Decreased cognition, Dizziness, Decreased strength, Impaired sensation  Visit Diagnosis: Other abnormalities of gait and mobility  Pain in left lower leg  Pain in right leg  Unsteadiness on feet  Muscle weakness (generalized)     Problem List Patient Active Problem List   Diagnosis Date Noted  . Anxiety state, unspecified 01/07/2013  . Multiple sclerosis (HCC) 09/17/2012   Edman Circle, PT, DPT 09/07/16    12:28 PM    [facility] [address] [place], [place], [address] Phone: [phone]   Fax:  [phone]  Name: [name] MRN: [id number] Date of Birth: July 31, 1970

## 2016-09-07 NOTE — Patient Instructions (Signed)
Piriformis Stretch, Supine    Lie supine, folded towel under sacrum, one ankle crossed onto opposite knee. Holding bottom leg behind knee, gently pull legs toward chest and roll toward top-leg side. Feel stretch in hip or pelvic region. Hold _30__ seconds.  Repeat _3__ times per session. Do __2_ sessions per day.  Copyright  VHI. All rights reserved.     Lying on back, pull right knee toward opposite shoulder. Hold ____ seconds. Repeat ____ times. Do ____ sessions per day.  Quads / HF, Supine    Lie near edge of bed, one leg bent, foot flat on bed. Other leg hanging over edge, relaxed, thigh resting entirely on bed. Bend hanging knee backward keeping thigh in contact with bed. Hold _30__ seconds.  Repeat _3__ times per session. Do _2__ sessions per day. Relax the leg you are NOT stretching on two pillows. Cat / Cow Flow    Inhale, press spine toward ceiling like a Halloween cat. Keeping strength in arms and abdominals, exhale to soften spine through neutral and into cow pose. Open chest and arch back. Initiate movement between cat and cow at tailbone, one vertebrae at a time. Repeat __10__ times.  Copyright  VHI. All rights reserved.     Copyright  VHI. All rights reserved.  BACK: Child's Pose (Sciatica)    Sit in knee-chest position and reach arms forward. Separate knees for comfort. Hold position for ___ breaths. Repeat ___ times. Do ___ times per day.  Copyright  VHI. All rights reserved.  Stretching: Hamstring (Supine)    Supporting right thigh behind knee, slowly straighten knee until stretch is felt in back of thigh. Hold __30__ seconds. Repeat __3__ times per set. Do __1__ sets per session. Do __2__ sessions per day. Bend your knee on the leg you are NOT stretching to take pressure off your back.    http://orth.exer.us/657   Copyright  VHI. All rights reserved.

## 2016-09-12 ENCOUNTER — Ambulatory Visit: Payer: Medicare Other | Attending: Diagnostic Neuroimaging | Admitting: Physical Therapy

## 2016-09-12 ENCOUNTER — Encounter: Payer: Self-pay | Admitting: Physical Therapy

## 2016-09-12 DIAGNOSIS — R2689 Other abnormalities of gait and mobility: Secondary | ICD-10-CM | POA: Insufficient documentation

## 2016-09-12 DIAGNOSIS — M6281 Muscle weakness (generalized): Secondary | ICD-10-CM | POA: Diagnosis present

## 2016-09-12 DIAGNOSIS — M79662 Pain in left lower leg: Secondary | ICD-10-CM | POA: Diagnosis present

## 2016-09-12 DIAGNOSIS — R2681 Unsteadiness on feet: Secondary | ICD-10-CM | POA: Insufficient documentation

## 2016-09-12 DIAGNOSIS — M79604 Pain in right leg: Secondary | ICD-10-CM | POA: Diagnosis present

## 2016-09-12 NOTE — Therapy (Signed)
[facility] [address] [place], [place], [address] Phone: [phone]   Fax:  [phone]  Physical Therapy Treatment  Patient Details  Name: [name] MRN: [id number] Date of Birth: [birth year] Referring Provider: Dr. Leta Baptist  Encounter Date: 09/12/2016      PT End of Session - 09/12/16 1412    Visit Number 4   Number of Visits 9   Date for PT Re-Evaluation 09/16/16   Authorization Type Medicare and UHC: G-CODE AND PROGRESS NOTE EVERY 10TH VISIT.    PT Start Time 1408  pt late for apt today   PT Stop Time 1447   PT Time Calculation (min) 39 min   Equipment Utilized During Treatment --  guarding for safety at times with FGA   Activity Tolerance Patient tolerated treatment well;No increased pain   Behavior During Therapy WFL for tasks assessed/performed      Past Medical History:  Diagnosis Date  . Anxiety and depression   . Arthritis   . Asthma   . Chronic pain    back,legs,neck  . Complication of anesthesia    cries  . Depression   . Fibromyalgia   . Gastroenteritis, acute    w/ dehydration  . Hypothyroidism   . Migraine headache   . Multiple allergies   . Multiple sclerosis ([place])   . Neuromuscular disorder (HCC)    neuropathy pain  . Ovarian mass November 11 2012   left   . Rheumatic disease     Past Surgical History:  Procedure Laterality Date  . BUNIONECTOMY     both feet  . CARPAL TUNNEL RELEASE Left 10/28/2012   Procedure: CARPAL TUNNEL RELEASE;  Surgeon: Wynonia Sours, MD;  Location: [facility];  Service: Orthopedics;  Laterality: Left;  . CHOLECYSTECTOMY  09/2014   in [place], portion of liver removed- cyst  . COLONOSCOPY W/ POLYPECTOMY  2012  . DILATION AND CURETTAGE OF UTERUS    . ELBOW ARTHROSCOPY     rt  . ELBOW SURGERY Right may 2014   tendon rair  . FOOT NEUROMA SURGERY     both feet  . HERNIA REPAIR  01/4800   umbilical  . MYOMECTOMY ABDOMINAL APPROACH     . OPEN REDUCTION INTERNAL FIXATION (ORIF) DISTAL RADIAL FRACTURE Left 10/28/2012   Procedure: OPEN REDUCTION INTERNAL FIXATION (ORIF) DISTAL RADIAL FRACTURE;  Surgeon: Wynonia Sours, MD;  Location: [facility];  Service: Orthopedics;  Laterality: Left;  . OVARY SURGERY     rt 1987  . ROTATOR CUFF REPAIR  2000   rt  . TONSILLECTOMY    . WRIST SURGERY Right 2006    There were no vitals filed for this visit.      Subjective Assessment - 09/12/16 1410    Subjective No new complaints. Still with LE and low back pain. No falls to report.    Pertinent History Anxiety, MS, fibromyalgia (per pt), RA (per pt), asthma, hypothyroidism, pt had gullbladder and hernia surgery in 2017-no precautions per pt   Limitations Sitting;Standing   How long can you sit comfortably? 20-30 minutes   How long can you stand comfortably? 5 minutes   Patient Stated Goals Relieve some pain and strength my legs, try to get "the wobbliness out".    Pain Score 9    Pain Location Generalized  legs and back   Pain Descriptors / Indicators Aching;Sore   Pain Type Neuropathic pain   Pain Onset More than a  month ago   Pain Frequency Constant   Aggravating Factors  activity and walking, standing for periods of time   Pain Relieving Factors rest and heat, pain meds as needed.            [facility] PT Assessment - 09/12/16 1414      Functional Gait  Assessment   Gait assessed  Yes   Gait Level Surface Walks 20 ft in less than 5.5 sec, no assistive devices, good speed, no evidence for imbalance, normal gait pattern, deviates no more than 6 in outside of the 12 in walkway width.   Change in Gait Speed Able to smoothly change walking speed without loss of balance or gait deviation. Deviate no more than 6 in outside of the 12 in walkway width.   Gait with Horizontal Head Turns Performs head turns smoothly with slight change in gait velocity (eg, minor disruption to smooth gait path), deviates 6-10 in outside 12  in walkway width, or uses an assistive device.   Gait with Vertical Head Turns Performs head turns with no change in gait. Deviates no more than 6 in outside 12 in walkway width.   Gait and Pivot Turn Pivot turns safely within 3 sec and stops quickly with no loss of balance.   Step Over Obstacle Is able to step over 2 stacked shoe boxes taped together (9 in total height) without changing gait speed. No evidence of imbalance.   Gait with Narrow Base of Support Ambulates 7-9 steps.   Gait with Eyes Closed Walks 20 ft, uses assistive device, slower speed, mild gait deviations, deviates 6-10 in outside 12 in walkway width. Ambulates 20 ft in less than 9 sec but greater than 7 sec.   Ambulating Backwards Walks 20 ft, uses assistive device, slower speed, mild gait deviations, deviates 6-10 in outside 12 in walkway width.   Steps Alternating feet, no rail.   Total Score 26   FGA comment: 26/30= low fall risk            [facility] Adult PT Treatment/Exercise - 09/12/16 1414      Transfers   Transfers Sit to Stand;Stand to Sit   Sit to Stand 6: Modified independent (Device/Increase time);With upper extremity assist;From chair/3-in-1   Stand to Sit 6: Modified independent (Device/Increase time);With upper extremity assist;To chair/3-in-1     Ambulation/Gait   Ambulation/Gait Yes   Ambulation/Gait Assistance 7: Independent   Ambulation/Gait Assistance Details no balance issues or toe scuffing noted ( of note, pt had on flip flops today)   Ambulation Distance (Feet) 1000 Feet   Assistive device None   Gait Pattern Step-through pattern;Decreased stride length;Decreased arm swing - right;Decreased arm swing - left   Ambulation Surface Level;Unlevel;Indoor            PT Short Term Goals - 08/17/16 1427      PT SHORT TERM GOAL #1   Title same as LTGs           PT Long Term Goals - 09/12/16 1413      PT LONG TERM GOAL #1   Title Pt will be IND in HEP in order to improve strength,  balance, pain, and flexibility. TARGET DATE FOR ALL LTGS: 09/14/16   Status On-going     PT LONG TERM GOAL #2   Title Pt will improve FGA score to >/=29/30 to decr. falls risk.    Baseline 09/12/16: 26/30 scored today, improved from last check just not to goal   Status Partially  Met     PT LONG TERM GOAL #3   Title Pt will report no falls over the last 2 weeks to improve safety during ADLs.    Baseline 09/12/16: pt reports not falls within the last week   Status Achieved     PT LONG TERM GOAL #4   Title Pt will tolerated dynamic standing balance activities for >/=10 minutes, with pain </=7/10, to improve quality of life and safety during ADLs.    Status On-going     PT LONG TERM GOAL #5   Title Pt will amb. 1000' over even/uneven terrain, IND, in order to improve functional mobility.    Baseline 09/12/16: met today   Status Achieved            Plan - 09/12/16 1413    Clinical Impression Statement today's skilled session began checking progress toward LTGs as pt's plan of care ends this week. Pt has met 2 goals and partially met 1. Will check remaining LTGs at next visit.    Rehab Potential Good   Clinical Impairments Affecting Rehab Potential hx of MS   PT Treatment/Interventions ADLs/Self Care Home Management;Biofeedback;Canalith Repostioning;Electrical Stimulation;Neuromuscular re-education;Cognitive remediation;Balance training;Therapeutic exercise;Therapeutic activities;Manual techniques;Functional mobility training;Stair training;Gait training;DME Instruction;Orthotic Fit/Training;Vestibular;Patient/family education   PT Next Visit Plan assess remaining LTGs   Consulted and Agree with Plan of Care Patient      Patient will benefit from skilled therapeutic intervention in order to improve the following deficits and impairments:  Abnormal gait, Decreased endurance, Decreased mobility, Decreased balance, Decreased coordination, Impaired flexibility, Decreased cognition, Dizziness,  Decreased strength, Impaired sensation  Visit Diagnosis: Other abnormalities of gait and mobility  Unsteadiness on feet  Muscle weakness (generalized)  Pain in right leg  Pain in left lower leg     Problem List Patient Active Problem List   Diagnosis Date Noted  . Anxiety state, unspecified 01/07/2013  . Multiple sclerosis ([place]) 09/17/2012    [name], PTA, [facility] [address], [place], [place] [address] [phone] 09/13/16, 8:38 AM   Name: [name] MRN: [id number] Date of Birth: [birth year]

## 2016-09-14 ENCOUNTER — Ambulatory Visit: Payer: Medicare Other

## 2016-09-14 DIAGNOSIS — M79604 Pain in right leg: Secondary | ICD-10-CM

## 2016-09-14 DIAGNOSIS — M79662 Pain in left lower leg: Secondary | ICD-10-CM

## 2016-09-14 DIAGNOSIS — R2681 Unsteadiness on feet: Secondary | ICD-10-CM

## 2016-09-14 DIAGNOSIS — R2689 Other abnormalities of gait and mobility: Secondary | ICD-10-CM

## 2016-09-14 DIAGNOSIS — M6281 Muscle weakness (generalized): Secondary | ICD-10-CM

## 2016-09-14 NOTE — Therapy (Signed)
[facility] [address] [place], [place], [address] Phone: [phone]   Fax:  [phone]  Physical Therapy Treatment  Patient Details  Name: [name] MRN: [id number] Date of Birth: May 29, 1970 Referring Provider: Dr. Leta Baptist  Encounter Date: 09/14/2016      PT End of Session - 09/14/16 1458    Visit Number 5   Number of Visits 9  PT requesting add'l 2x/week for 4 weeks, as pt missed several weeks of PT 2/2 vacation and scheduling   Date for PT Re-Evaluation 09/16/16   Authorization Type Medicare and UHC: G-CODE AND PROGRESS NOTE EVERY 10TH VISIT.    PT Start Time 1405  pt arrived late   PT Stop Time 1447   PT Time Calculation (min) 42 min   Equipment Utilized During Treatment --  S to ensure safety   Activity Tolerance Patient tolerated treatment well;No increased pain   Behavior During Therapy WFL for tasks assessed/performed      Past Medical History:  Diagnosis Date  . Anxiety and depression   . Arthritis   . Asthma   . Chronic pain    back,legs,neck  . Complication of anesthesia    cries  . Depression   . Fibromyalgia   . Gastroenteritis, acute    w/ dehydration  . Hypothyroidism   . Migraine headache   . Multiple allergies   . Multiple sclerosis ([facility])   . Neuromuscular disorder (HCC)    neuropathy pain  . Ovarian mass November 11 2012   left   . Rheumatic disease     Past Surgical History:  Procedure Laterality Date  . BUNIONECTOMY     both feet  . CARPAL TUNNEL RELEASE Left 10/28/2012   Procedure: CARPAL TUNNEL RELEASE;  Surgeon: Wynonia Sours, MD;  Location: [facility];  Service: Orthopedics;  Laterality: Left;  . CHOLECYSTECTOMY  09/2014   in [place], portion of liver removed- cyst  . COLONOSCOPY W/ POLYPECTOMY  2012  . DILATION AND CURETTAGE OF UTERUS    . ELBOW ARTHROSCOPY     rt  . ELBOW SURGERY Right may 2014   tendon rair  . FOOT NEUROMA SURGERY     both  feet  . HERNIA REPAIR  05/1570   umbilical  . MYOMECTOMY ABDOMINAL APPROACH    . OPEN REDUCTION INTERNAL FIXATION (ORIF) DISTAL RADIAL FRACTURE Left 10/28/2012   Procedure: OPEN REDUCTION INTERNAL FIXATION (ORIF) DISTAL RADIAL FRACTURE;  Surgeon: Wynonia Sours, MD;  Location: [facility];  Service: Orthopedics;  Laterality: Left;  . OVARY SURGERY     rt 1987  . ROTATOR CUFF REPAIR  2000   rt  . TONSILLECTOMY    . WRIST SURGERY Right 2006    There were no vitals filed for this visit.      Subjective Assessment - 09/14/16 1406    Subjective Pt denied falls or changes since last visit.    Pertinent History Anxiety, MS, fibromyalgia (per pt), RA (per pt), asthma, hypothyroidism, pt had gullbladder and hernia surgery in 2017-no precautions per pt   Patient Stated Goals Relieve some pain and strength my legs, try to get "the wobbliness out".    Currently in Pain? Yes   Pain Score --  7.5/10   Pain Location Generalized  BLEs and back   Pain Orientation Right;Left;Lower   Pain Descriptors / Indicators Throbbing;Sharp  BLEs throbbing and back pain is sharp   Pain Type Neuropathic pain   Pain  Onset More than a month ago   Pain Frequency Constant   Aggravating Factors  activity and walking, standing for prolonged periods of time   Pain Relieving Factors rest and heat, pain meds prn           Therex: Pt performed stretching HEP with S to ensure safety. Please see pt instructions for details. Pt denied incr. In pain during session. Pt also performed DKTC with lateral rocking after hip flexor stretch for low back comfort. Pt did not perform balance HEP today 2/2 time constraints but PT will assess next session.                 Self Care:     PT Education - 09/14/16 1456    Education provided Yes   Education Details PT reviewed stretching HEP with pt, pt tolerated stretches well. PT educated pt on the importance of taking rest breaks as needed to avoid  excessive fatigue. PT also discussed renewal to continue to work on goals not met and new goals, pt agreeable. PT encouraged pt to make a list of the most important activities that need to be accomplished vs. not important, on days she feels fatigued. PT provided pt with information re: Yoga With Adriene on Youtube.com, for gentle yoga stretches.    Person(s) Educated Patient   Methods Explanation;Handout;Verbal cues   Comprehension Verbalized understanding;Returned demonstration          PT Short Term Goals - 08/17/16 1427      PT SHORT TERM GOAL #1   Title same as LTGs           PT Long Term Goals - 09/14/16 1505      PT LONG TERM GOAL #1   Title Pt will be IND in HEP in order to improve strength, balance, pain, and flexibility. TARGET DATE FOR ALL LTGS: 10/12/16   Baseline original POC: 09/14/16, new POC will include unmet and new LTGs: 10/12/16   Status Achieved     PT LONG TERM GOAL #2   Title Pt will improve FGA score to >/=29/30 to decr. falls risk.    Baseline 09/12/16: 26/30, improved from last check just not to goal   Status On-going     PT LONG TERM GOAL #3   Title Pt will report no falls over the last 2 weeks to improve safety during ADLs.    Baseline 09/12/16: pt reports not falls within the last week   Status Achieved     PT LONG TERM GOAL #4   Title Pt will tolerated dynamic standing balance activities for >/=10 minutes, with pain </=7/10, to improve quality of life and safety during ADLs.    Baseline 09/14/16: pt's baseline pain 7.5/10 today   Status Not Met     PT LONG TERM GOAL #5   Title Pt will amb. 1000' over grassy/hills/mulch terrain while performing dynamic gait activities, IND, in order to improve functional mobility.    Baseline 09/12/16: met today   Status Revised     Additional Long Term Goals   Additional Long Term Goals Yes     PT LONG TERM GOAL #6   Title Pt will be IND in progressed HEP to improve balance, flexibility, strength, and endurance.    Status New               Plan - 09/14/16 1502    Clinical Impression Statement Pt demonstrated progress as she met LTGs 1, 3, and 5 (PT revised LTG  5), LTG 2 and 4 not met. Today's skilled session focused on assessing pt's HEP, pt tolerated stretches well in all positions without incr. in pain. PT will finish assesing balance HEP next session and progress as tolerated. PT also to incorporate RLE strengthening into HEP. Pt would continue to benefit from skilled PT to improve safety during functional mobility. Pt missed serveral weeks of PT 2/2 vacation and scheduling, therefore, PT requesting additional 2x/week for 4 weeks to improve deficits listed below.    Rehab Potential Good   Clinical Impairments Affecting Rehab Potential hx of MS   PT Frequency 2x / week  PT requesting add'l 2x/week for 4 weeks   PT Duration 4 weeks   PT Treatment/Interventions ADLs/Self Care Home Management;Biofeedback;Canalith Repostioning;Electrical Stimulation;Neuromuscular re-education;Cognitive remediation;Balance training;Therapeutic exercise;Therapeutic activities;Manual techniques;Functional mobility training;Stair training;Gait training;DME Instruction;Orthotic Fit/Training;Vestibular;Patient/family education   PT Next Visit Plan Assess balance HEP and progress as tolerated. Initiate functional strengthening of RLE (hip abd/ext and hamstrings).    Consulted and Agree with Plan of Care Patient      Patient will benefit from skilled therapeutic intervention in order to improve the following deficits and impairments:  Abnormal gait, Decreased endurance, Decreased mobility, Decreased balance, Decreased coordination, Impaired flexibility, Decreased cognition, Dizziness, Decreased strength, Impaired sensation  Visit Diagnosis: Other abnormalities of gait and mobility - Plan: PT plan of care cert/re-cert  Pain in right leg - Plan: PT plan of care cert/re-cert  Pain in left lower leg - Plan: PT plan of care  cert/re-cert  Unsteadiness on feet - Plan: PT plan of care cert/re-cert  Muscle weakness (generalized) - Plan: PT plan of care cert/re-cert     Problem List Patient Active Problem List   Diagnosis Date Noted  . Anxiety state, unspecified 01/07/2013  . Multiple sclerosis ([facility]) 09/17/2012    Leocadio Heal L 09/14/2016, 3:11 PM  [facility] [address] [place], [place], [address] Phone: [phone]   Fax:  [phone]  Name: [name] MRN: [id number] Date of Birth: 1970/05/25  Geoffry Paradise, PT,DPT 09/14/16 3:14 PM Phone: [phone] Fax: [phone]

## 2016-09-14 NOTE — Patient Instructions (Signed)
HIP: Hamstrings - Short Sitting    Rest leg on a chair and point toes. Keep knee straight. Lift chest. Hold _30__ seconds. Repeat with other leg.  __3_ reps per set, __2-3_ sets per day, __7_ days per week  Copyright  VHI. All rights reserved.    Copyright  VHI. All rights reserved.   Piriformis Stretch, Sitting    Sit, one ankle on opposite knee, same-side hand on crossed knee. Push down on knee, keeping spine straight. Lean torso forward, with flat back, until tension is felt in hamstrings and gluteals of crossed-leg side. Hold _30__ seconds. Repeat with other leg. Repeat _3__ times per session. Do _2-3__ sessions per day. Perform 7 days a week.   Copyright  VHI. All rights reserved.   Hip Flexor Stretch    Lying on back near edge of bed, bend one leg, foot flat. Hang other leg over edge, relaxed, thigh resting entirely on bed for __2__ minutes. Repeat with other leg. Repeat __1__ times. Do __2-3__ sessions per day. Perform every day. Advanced Exercise: Bend knee back keeping thigh in contact with bed.  http://gt2.exer.us/347   Copyright  VHI. All rights reserved.    Backward    Walk backwards with eyes open. Take 10 even steps, making sure each foot lifts off floor. Repeat for __4__ laps per session. Do __1__ sessions per day.   Copyright  VHI. All rights reserved.  Feet Heel-Toe "Tandem"    Perform at counter, hold counter with one hand, walk a straight line bringing one foot directly in front of the other. Repeat for __4__ laps per session. Do _1___ sessions per day.  Copyright  VHI. All rights reserved.  Walking    Perform at counter, with eyes closed. Walk on solid surface with hand close to support. Attempt to keep hand away from support for longer periods of time, keeping a straight path. Repeat __4__ times along counter per session. Do __1__ sessions per day.   Copyright  VHI. All rights reserved.  "I love a Licensed conveyancer    March  along counter, holding on with one hand, make sure knees and hips reach 90 degrees. Repeat __4__ times along counter. Do __1__ sessions per day.  http://gt2.exer.us/345   Copyright  VHI. All rights reserved.   Piriformis Stretch, Supine    Lie supine, folded towel under sacrum, one ankle crossed onto opposite knee. Holding bottom leg behind knee, gently pull legs toward chest and roll toward top-leg side. Feel stretch in hip or pelvic region. Hold _30__ seconds.  Repeat _3__ times per session. Do __2_ sessions per day.  Copyright  VHI. All rights reserved.     Lying on back, pull right knee toward opposite shoulder. Hold ____ seconds. Repeat ____ times. Do ____ sessions per day.    Cat / Cow Flow    Inhale, press spine toward ceiling like a Halloween cat. Keeping strength in arms and abdominals, exhale to soften spine through neutral and into cow pose. Open chest and arch back. Initiate movement between cat and cow at tailbone, one vertebrae at a time. Repeat __10__ times.  Copyright  VHI. All rights reserved.     Copyright  VHI. All rights reserved.  BACK: Child's Pose (Sciatica)    Sit in knee-chest position and reach arms forward. Separate knees for comfort. Hold position for ___ breaths. Repeat ___ times. Do ___ times per day.  Copyright  VHI. All rights reserved.  Stretching: Hamstring (Supine)    Supporting right thigh  behind knee, slowly straighten knee until stretch is felt in back of thigh. Hold __30__ seconds. Repeat __3__ times per set. Do __1__ sets per session. Do __2__ sessions per day. Bend your knee on the leg you are NOT stretching to take pressure off your back.    http://orth.exer.us/657   Copyright  VHI. All rights reserved.

## 2016-09-19 ENCOUNTER — Ambulatory Visit: Payer: Medicare Other | Admitting: Physical Therapy

## 2016-09-19 ENCOUNTER — Encounter: Payer: Self-pay | Admitting: Physical Therapy

## 2016-09-19 DIAGNOSIS — M79662 Pain in left lower leg: Secondary | ICD-10-CM

## 2016-09-19 DIAGNOSIS — R2689 Other abnormalities of gait and mobility: Secondary | ICD-10-CM

## 2016-09-19 DIAGNOSIS — M6281 Muscle weakness (generalized): Secondary | ICD-10-CM

## 2016-09-19 DIAGNOSIS — M79604 Pain in right leg: Secondary | ICD-10-CM

## 2016-09-19 DIAGNOSIS — R2681 Unsteadiness on feet: Secondary | ICD-10-CM

## 2016-09-19 NOTE — Therapy (Signed)
[facility] [address] [place], [place], [address] Phone: [phone]   Fax:  [phone]  Physical Therapy Treatment  Patient Details  Name: [name] MRN: [id number] Date of Birth: Oct 03, 1970 Referring Provider: Dr. Leta Baptist  Encounter Date: 09/19/2016      PT End of Session - 09/19/16 1404    Visit Number 6   Number of Visits 9  PT requesting add'l 2x/week for 4 weeks, as pt missed several weeks of PT 2/2 vacation and scheduling   Date for PT Re-Evaluation 09/16/16   Authorization Type Medicare and UHC: G-CODE AND PROGRESS NOTE EVERY 10TH VISIT.    PT Start Time 1320   PT Stop Time 1400   PT Time Calculation (min) 40 min   Equipment Utilized During Treatment --  S to ensure safety   Activity Tolerance Patient tolerated treatment well;No increased pain   Behavior During Therapy WFL for tasks assessed/performed      Past Medical History:  Diagnosis Date  . Anxiety and depression   . Arthritis   . Asthma   . Chronic pain    back,legs,neck  . Complication of anesthesia    cries  . Depression   . Fibromyalgia   . Gastroenteritis, acute    w/ dehydration  . Hypothyroidism   . Migraine headache   . Multiple allergies   . Multiple sclerosis ([facility])   . Neuromuscular disorder (H[facility])    neuropathy pain  . Ovarian mass November 11 2012   left   . Rheumatic disease     Past Surgical History:  Procedure Laterality Date  . BUNIONECTOMY     both feet  . CARPAL TUNNEL RELEASE Left 10/28/2012   Procedure: CARPAL TUNNEL RELEASE;  Surgeon: Wynonia Sours, MD;  Location: [facility];  Service: Orthopedics;  Laterality: Left;  . CHOLECYSTECTOMY  09/2014   in [place], portion of liver removed- cyst  . COLONOSCOPY W/ POLYPECTOMY  2012  . DILATION AND CURETTAGE OF UTERUS    . ELBOW ARTHROSCOPY     rt  . ELBOW SURGERY Right may 2014   tendon rair  . FOOT NEUROMA SURGERY     both feet  . HERNIA  REPAIR  06/9526   umbilical  . MYOMECTOMY ABDOMINAL APPROACH    . OPEN REDUCTION INTERNAL FIXATION (ORIF) DISTAL RADIAL FRACTURE Left 10/28/2012   Procedure: OPEN REDUCTION INTERNAL FIXATION (ORIF) DISTAL RADIAL FRACTURE;  Surgeon: Wynonia Sours, MD;  Location: [facility];  Service: Orthopedics;  Laterality: Left;  . OVARY SURGERY     rt 1987  . ROTATOR CUFF REPAIR  2000   rt  . TONSILLECTOMY    . WRIST SURGERY Right 2006    There were no vitals filed for this visit.      Subjective Assessment - 09/19/16 1322    Subjective Pt denied falls or changes since last visit. Pt has been taking care of boyfriend due to his knee surgery. Noted some "wobbliness" with multple stair negotiation. Walking for exercise- daily 20 min.   Pertinent History Anxiety, MS, fibromyalgia (per pt), RA (per pt), asthma, hypothyroidism, pt had gullbladder and hernia surgery in 2017-no precautions per pt   Patient Stated Goals Relieve some pain and strength my legs, try to get "the wobbliness out".    Currently in Pain? Yes   Pain Score 6    Pain Location Back   Pain Orientation Right;Left;Lower   Pain Descriptors / Indicators Throbbing;[place]  Pain Type Neuropathic pain   Pain Radiating Towards down both LEs.   Pain Onset More than a month ago        09/19/16 Performed HEP below with intermittent UE support and min cues for technique  Backward    Walk backwards with eyes open. Take 10 even steps, making sure each foot lifts off floor. Repeat for __4__ laps per session. Do __1__ sessions per day.   Copyright  VHI. All rights reserved.  Feet Heel-Toe "Tandem"    Perform at counter, hold counter with one hand, walk a straight line bringing one foot directly in front of the other. Repeat for __4__ laps per session. Do _1___ sessions per day.  Copyright  VHI. All rights reserved.  Walking    Perform at counter, with eyes closed. Walk on solid surface with hand close to support.  Attempt to keep hand away from support for longer periods of time, keeping a straight path. Repeat __4__ times along counter per session. Do __1__ sessions per day.   Copyright  VHI. All rights reserved.  "I love a Database administrator    March along counter, holding on with one hand, make sure knees and hips reach 90 degrees. Repeat __4__ times along counter. Do __1__ sessions per day.  http://gt2.exer.us/345   Copyright  VHI. All rights reserved.                    [facility] Adult PT Treatment/Exercise - 09/19/16 0001      Ambulation/Gait   Ambulation/Gait Yes   Ambulation/Gait Assistance 5: Supervision;4: Min guard   Ambulation/Gait Assistance Details working on balance with head turns on uneven surface   Ambulation Distance (Feet) 100 Feet  +25x4   Assistive device None   Gait Pattern Step-through pattern;Decreased stride length;Decreased arm swing - right;Decreased arm swing - left;Scissoring   Ambulation Surface Unlevel;Outdoor;Paved;Gravel;Grass           [facility]) - 09/19/16 1403    PWR! Up x10   PWR! Rock x10   PWR! Twist x10   PWR Step x10   Comments min cues for technique; LOB x2 requiring 1 UE support to correct          Balance Exercises - 09/19/16 1403      Balance Exercises: Standing   Stepping Strategy Foam/compliant surface;Anterior;Posterior  progressing with head turns; intermittent UE support.           PT Education - 09/19/16 1624    Education provided Yes   Education Details Encouraged pt to look into the MS society for ideas and options that may assist pt with daily activities.   Person(s) Educated Patient   Methods Explanation   Comprehension Verbalized understanding          PT Short Term Goals - 08/17/16 1427      PT SHORT TERM GOAL #1   Title same as LTGs           PT Long Term Goals - 09/14/16 1505      PT LONG TERM GOAL #1   Title Pt will be IND in HEP in order to improve strength, balance, pain, and  flexibility. TARGET DATE FOR ALL LTGS: 10/12/16   Baseline original POC: 09/14/16, new POC will include unmet and new LTGs: 10/12/16   Status Achieved     PT LONG TERM GOAL #2   Title Pt will improve FGA score to >/=29/30 to decr. falls risk.    Baseline 09/12/16: 26/30, improved  from last check just not to goal   Status On-going     PT LONG TERM GOAL #3   Title Pt will report no falls over the last 2 weeks to improve safety during ADLs.    Baseline 09/12/16: pt reports not falls within the last week   Status Achieved     PT LONG TERM GOAL #4   Title Pt will tolerated dynamic standing balance activities for >/=10 minutes, with pain </=7/10, to improve quality of life and safety during ADLs.    Baseline 09/14/16: pt's baseline pain 7.5/10 today   Status Not Met     PT LONG TERM GOAL #5   Title Pt will amb. 1000' over grassy/hills/mulch terrain while performing dynamic gait activities, IND, in order to improve functional mobility.    Baseline 09/12/16: met today   Status Revised     Additional Long Term Goals   Additional Long Term Goals Yes     PT LONG TERM GOAL #6   Title Pt will be IND in progressed HEP to improve balance, flexibility, strength, and endurance.   Status New               Plan - 09/19/16 1627    Clinical Impression Statement Pt demonstrates balance portion of HEP, although pt continues to be challenged by it and requires intermittent UE support.  Pt demonstrates requiring min guard for gait on outdoor uneven surface with visual scanning tasks.   Rehab Potential Good   Clinical Impairments Affecting Rehab Potential hx of MS   PT Frequency 2x / week  PT requesting add'l 2x/week for 4 weeks   PT Duration 4 weeks   PT Treatment/Interventions ADLs/Self Care Home Management;Biofeedback;Canalith Repostioning;Electrical Stimulation;Neuromuscular re-education;Cognitive remediation;Balance training;Therapeutic exercise;Therapeutic activities;Manual techniques;Functional  mobility training;Stair training;Gait training;DME Instruction;Orthotic Fit/Training;Vestibular;Patient/family education   PT Next Visit Plan standing balance and gait on uneven surface.   Consulted and Agree with Plan of Care Patient      Patient will benefit from skilled therapeutic intervention in order to improve the following deficits and impairments:  Abnormal gait, Decreased endurance, Decreased mobility, Decreased balance, Decreased coordination, Impaired flexibility, Decreased cognition, Dizziness, Decreased strength, Impaired sensation  Visit Diagnosis: Other abnormalities of gait and mobility  Pain in right leg  Pain in left lower leg  Unsteadiness on feet  Muscle weakness (generalized)     Problem List Patient Active Problem List   Diagnosis Date Noted  . Anxiety state, unspecified 01/07/2013  . Multiple sclerosis ([facility]) 09/17/2012    Bjorn Loser, PTA  09/19/16, 4:32 PM   [facility] [address] [place], [place], [address] Phone: [phone]   Fax:  [phone]  Name: Jeannette Maddy MRN: [id number] Date of Birth: [birth year]

## 2016-09-19 NOTE — Patient Instructions (Addendum)
09/19/16 Performed HEP below with intermittent UE support and min cues for technique  Backward    Walk backwards with eyes open. Take 10 even steps, making sure each foot lifts off floor. Repeat for __4__ laps per session. Do __1__ sessions per day.   Copyright  VHI. All rights reserved.  Feet Heel-Toe "Tandem"    Perform at counter, hold counter with one hand, walk a straight line bringing one foot directly in front of the other. Repeat for __4__ laps per session. Do _1___ sessions per day.  Copyright  VHI. All rights reserved.  Walking    Perform at counter, with eyes closed. Walk on solid surface with hand close to support. Attempt to keep hand away from support for longer periods of time, keeping a straight path. Repeat __4__ times along counter per session. Do __1__ sessions per day.   Copyright  VHI. All rights reserved.  "I love a Licensed conveyancer    March along counter, holding on with one hand, make sure knees and hips reach 90 degrees. Repeat __4__ times along counter. Do __1__ sessions per day.  http://gt2.exer.us/345   Copyright  VHI. All rights reserved.

## 2016-09-21 ENCOUNTER — Ambulatory Visit: Payer: Medicare Other | Admitting: Physical Therapy

## 2016-09-26 ENCOUNTER — Ambulatory Visit: Payer: Medicare Other

## 2016-09-26 DIAGNOSIS — M79662 Pain in left lower leg: Secondary | ICD-10-CM

## 2016-09-26 DIAGNOSIS — R2689 Other abnormalities of gait and mobility: Secondary | ICD-10-CM

## 2016-09-26 DIAGNOSIS — M79604 Pain in right leg: Secondary | ICD-10-CM

## 2016-09-26 NOTE — Therapy (Signed)
[facility] [address] [place], [place], [address] Phone: [phone]   Fax:  [phone]  Physical Therapy Treatment  Patient Details  Name: [name] MRN: [id number] Date of Birth: Jun 10, 1970 Referring Provider: Dr. Leta Baptist  Encounter Date: 09/26/2016      PT End of Session - 09/26/16 0857    Visit Number 6   Number of Visits 18 PT requesting add'l 2x/week for 4 weeks, as pt missed several weeks of PT 2/2 vacation and scheduling   Date for PT Re-Evaluation 10/16/16   Authorization Type Medicare and UHC: G-CODE AND PROGRESS NOTE EVERY 10TH VISIT.    Equipment Utilized During Treatment --  S to ensure safety   Activity Tolerance Patient tolerated treatment well;No increased pain   Behavior During Therapy WFL for tasks assessed/performed      Past Medical History:  Diagnosis Date  . Anxiety and depression   . Arthritis   . Asthma   . Chronic pain    back,legs,neck  . Complication of anesthesia    cries  . Depression   . Fibromyalgia   . Gastroenteritis, acute    w/ dehydration  . Hypothyroidism   . Migraine headache   . Multiple allergies   . Multiple sclerosis ([facility])   . Neuromuscular disorder (HCC)    neuropathy pain  . Ovarian mass November 11 2012   left   . Rheumatic disease     Past Surgical History:  Procedure Laterality Date  . BUNIONECTOMY     both feet  . CARPAL TUNNEL RELEASE Left 10/28/2012   Procedure: CARPAL TUNNEL RELEASE;  Surgeon: Wynonia Sours, MD;  Location: [facility];  Service: Orthopedics;  Laterality: Left;  . CHOLECYSTECTOMY  09/2014   in [place], portion of liver removed- cyst  . COLONOSCOPY W/ POLYPECTOMY  2012  . DILATION AND CURETTAGE OF UTERUS    . ELBOW ARTHROSCOPY     rt  . ELBOW SURGERY Right may 2014   tendon rair  . FOOT NEUROMA SURGERY     both feet  . HERNIA REPAIR  0/3559   umbilical  . MYOMECTOMY ABDOMINAL APPROACH    . OPEN REDUCTION  INTERNAL FIXATION (ORIF) DISTAL RADIAL FRACTURE Left 10/28/2012   Procedure: OPEN REDUCTION INTERNAL FIXATION (ORIF) DISTAL RADIAL FRACTURE;  Surgeon: Wynonia Sours, MD;  Location: [facility];  Service: Orthopedics;  Laterality: Left;  . OVARY SURGERY     rt 1987  . ROTATOR CUFF REPAIR  2000   rt  . TONSILLECTOMY    . WRIST SURGERY Right 2006    There were no vitals filed for this visit.      Subjective Assessment - 09/26/16 0855    Subjective Pt denied falls since last visit. Pt reported her back pain and hip pain is worse in the mornings.    Pertinent History Anxiety, MS, fibromyalgia (per pt), RA (per pt), asthma, hypothyroidism, pt had gullbladder and hernia surgery in 2017-no precautions per pt   Patient Stated Goals Relieve some pain and strength my legs, try to get "the wobbliness out".    Currently in Pain? Yes   Pain Score --  8.5/10   Pain Location Back   Pain Orientation Lower;Right;Left   Pain Descriptors / Indicators Aching;Throbbing  R hip is sharp pain   Pain Type Neuropathic pain;Chronic pain   Pain Radiating Towards R hip   Pain Onset More than a month ago   Pain Frequency Constant  Aggravating Factors  activity, standing for prolonged periods of time   Pain Relieving Factors rest and heat, pain meds       Therex: Pt performed supine and hooklying stretches to decr. Back and hip pain. Pt and PT performed contract and relax technique with DKTC position (hip flexion and ext holds) inwith 10 second hold x3 reps. -DKTC with rocking in lateral directions.  -B LTR stretch 3x30sec./LE -B piriformis stretch in hooklying 3x30sec./LE -Child's pose 2x30sec. With B shoulders overhead x1 rep and then at side x1 rep. -B hip flexor stretch with LE off EOB x30 sec.  Pt reported pain decreased 6/10 after stretches. PT also able to ambulate without less pain and improved antalgic gait after therex.                       [facility] PT  Treatment/Exercise - 09/26/16 1043      Ambulation/Gait   Ambulation/Gait Yes   Ambulation/Gait Assistance 5: Supervision;7: Independent   Ambulation/Gait Assistance Details Pt amb. with S during back to the gym 2/2 antalgic gait and unsteadiness. Pt progressed to MOD I level after stretches as pain was decreased.   Ambulation Distance (Feet) 100 Feet  and 345'   Assistive device None   Gait Pattern Step-through pattern;Decreased stride length;Decreased arm swing - right;Decreased arm swing - left   Ambulation Surface Level;Indoor                PT Education - 09/26/16 1058    Education provided Yes   Education Details PT discussed the importance of performing stretches in the morning to decr. pain and improve gait pattern and safety.   Person(s) Educated Patient   Methods Explanation   Comprehension Verbalized understanding          PT Short Term Goals - 08/17/16 1427      PT SHORT TERM GOAL #1   Title same as LTGs           PT Long Term Goals - 09/14/16 1505      PT LONG TERM GOAL #1   Title Pt will be IND in HEP in order to improve strength, balance, pain, and flexibility. TARGET DATE FOR ALL LTGS: 10/12/16   Baseline original POC: 09/14/16, new POC will include unmet and new LTGs: 10/12/16   Status Achieved     PT LONG TERM GOAL #2   Title Pt will improve FGA score to >/=29/30 to decr. falls risk.    Baseline 09/12/16: 26/30, improved from last check just not to goal   Status On-going     PT LONG TERM GOAL #3   Title Pt will report no falls over the last 2 weeks to improve safety during ADLs.    Baseline 09/12/16: pt reports not falls within the last week   Status Achieved     PT LONG TERM GOAL #4   Title Pt will tolerated dynamic standing balance activities for >/=10 minutes, with pain </=7/10, to improve quality of life and safety during ADLs.    Baseline 09/14/16: pt's baseline pain 7.5/10 today   Status Not Met     PT LONG TERM GOAL #5   Title Pt will  amb. 1000' over grassy/hills/mulch terrain while performing dynamic gait activities, IND, in order to improve functional mobility.    Baseline 09/12/16: met today   Status Revised     Additional Long Term Goals   Additional Long Term Goals Yes  PT LONG TERM GOAL #6   Title Pt will be IND in progressed HEP to improve balance, flexibility, strength, and endurance.   Status New               Plan - 09/26/16 1059    Clinical Impression Statement Today's skilled session focused on decreasing pain as pain caused antalgic gait and gait deviations, along with unsteadiness. Pt demonstrated improvement after therex, as pain was reduced and she was able to amb. with less gait deviations. Pt would continue to benefit from skilled PT to improve safety during functional mobility.    Rehab Potential Good   Clinical Impairments Affecting Rehab Potential hx of MS   PT Frequency 2x / week  PT requesting add'l 2x/week for 4 weeks   PT Duration 4 weeks   PT Treatment/Interventions ADLs/Self Care Home Management;Biofeedback;Canalith Repostioning;Electrical Stimulation;Neuromuscular re-education;Cognitive remediation;Balance training;Therapeutic exercise;Therapeutic activities;Manual techniques;Functional mobility training;Stair training;Gait training;DME Instruction;Orthotic Fit/Training;Vestibular;Patient/family education   PT Next Visit Plan Continue standing balance and gait on uneven surface.   Consulted and Agree with Plan of Care Patient      Patient will benefit from skilled therapeutic intervention in order to improve the following deficits and impairments:  Abnormal gait, Decreased endurance, Decreased mobility, Decreased balance, Decreased coordination, Impaired flexibility, Decreased cognition, Dizziness, Decreased strength, Impaired sensation  Visit Diagnosis: Other abnormalities of gait and mobility  Pain in right leg  Pain in left lower leg     Problem List Patient Active  Problem List   Diagnosis Date Noted  . Anxiety state, unspecified 01/07/2013  . Multiple sclerosis ([facility]) 09/17/2012    Teyton Pattillo L 09/26/2016, 11:02 AM  [facility] [address] [place], [place], [address] Phone: [phone]   Fax:  [phone]  Name: [name] MRN: [id number] Date of Birth: 1970/08/17  Geoffry Paradise, PT,DPT 09/26/16 11:03 AM Phone: [phone] Fax: [phone]

## 2016-09-28 ENCOUNTER — Ambulatory Visit: Payer: Medicare Other

## 2016-09-28 DIAGNOSIS — M79604 Pain in right leg: Secondary | ICD-10-CM

## 2016-09-28 DIAGNOSIS — R2689 Other abnormalities of gait and mobility: Secondary | ICD-10-CM | POA: Diagnosis not present

## 2016-09-28 DIAGNOSIS — M79662 Pain in left lower leg: Secondary | ICD-10-CM

## 2016-09-28 DIAGNOSIS — R2681 Unsteadiness on feet: Secondary | ICD-10-CM

## 2016-09-28 NOTE — Therapy (Addendum)
[facility] [address] [place], [place], [address] Phone: [phone]   Fax:  [phone]  Physical Therapy Treatment  Patient Details  Name: [name] MRN: [id number] Date of Birth: 07/05/1970 Referring Provider: Dr. Leta Baptist  Encounter Date: 09/28/2016      PT End of Session - 09/28/16 0944    Visit Number 8   Number of Visits 18   Date for PT Re-Evaluation 10/16/16   Authorization Type Medicare and UHC: G-CODE AND PROGRESS NOTE EVERY 10TH VISIT.    PT Start Time [phone]   PT Stop Time 0929   PT Time Calculation (min) 40 min   Equipment Utilized During Treatment Gait belt   Activity Tolerance Patient tolerated treatment well   Behavior During Therapy WFL for tasks assessed/performed      Past Medical History:  Diagnosis Date  . Anxiety and depression   . Arthritis   . Asthma   . Chronic pain    back,legs,neck  . Complication of anesthesia    cries  . Depression   . Fibromyalgia   . Gastroenteritis, acute    w/ dehydration  . Hypothyroidism   . Migraine headache   . Multiple allergies   . Multiple sclerosis ([facility])   . Neuromuscular disorder (HCC)    neuropathy pain  . Ovarian mass November 11 2012   left   . Rheumatic disease     Past Surgical History:  Procedure Laterality Date  . BUNIONECTOMY     both feet  . CARPAL TUNNEL RELEASE Left 10/28/2012   Procedure: CARPAL TUNNEL RELEASE;  Surgeon: Wynonia Sours, MD;  Location: [facility];  Service: Orthopedics;  Laterality: Left;  . CHOLECYSTECTOMY  09/2014   in [place], portion of liver removed- cyst  . COLONOSCOPY W/ POLYPECTOMY  2012  . DILATION AND CURETTAGE OF UTERUS    . ELBOW ARTHROSCOPY     rt  . ELBOW SURGERY Right may 2014   tendon rair  . FOOT NEUROMA SURGERY     both feet  . HERNIA REPAIR  11/1217   umbilical  . MYOMECTOMY ABDOMINAL APPROACH    . OPEN REDUCTION INTERNAL FIXATION (ORIF) DISTAL RADIAL FRACTURE Left  10/28/2012   Procedure: OPEN REDUCTION INTERNAL FIXATION (ORIF) DISTAL RADIAL FRACTURE;  Surgeon: Wynonia Sours, MD;  Location: [facility];  Service: Orthopedics;  Laterality: Left;  . OVARY SURGERY     rt 1987  . ROTATOR CUFF REPAIR  2000   rt  . TONSILLECTOMY    . WRIST SURGERY Right 2006    There were no vitals filed for this visit.      Subjective Assessment - 09/28/16 0851    Subjective Pt denied falls or changes since last visit.    Pertinent History Anxiety, MS, fibromyalgia (per pt), RA (per pt), asthma, hypothyroidism, pt had gullbladder and hernia surgery in 2017-no precautions per pt   Patient Stated Goals Relieve some pain and strength my legs, try to get "the wobbliness out".    Currently in Pain? Yes   Pain Score 8    Pain Location Back   Pain Orientation Right;Left;Lower   Pain Descriptors / Indicators Aching;Cramping;Throbbing   Pain Type Chronic pain;Neuropathic pain   Pain Radiating Towards BLEs   Pain Onset More than a month ago   Pain Frequency Constant   Aggravating Factors  worse in the morning, standing for prolonged periods of time   Pain Relieving Factors rest and heat, stretches  Therex:  Pt performed supine and hooklying stretches to decr. Back and hip pain. Pt and PT performed contract and relax technique with DKTC position (hip flexion and ext holds) with 10 second hold x3 reps. -DKTC with rocking in lateral directions.  Pt reported pain reduced to 7/10 after stretches.                       Balance Exercises - 09/28/16 0938      Balance Exercises: Standing   Rockerboard Anterior/posterior;Lateral;Head turns;EO;EC;10 seconds;30 seconds;5 reps;10 reps  no UE support   Other Standing Exercises All balance performed in // bars with min guard to min A for safety and to maintain balance: pt performed rockerboard activities listed above and then performed feet apart with eyes open (4x30sec. holds) and eyes closed  (4x10sec. holds). Cues and demo for technique and safety. Pt then amb. 345' over even terrain performing head turns at various speeds with no reported dizziness and no incr. postural sway noted. Pt required 3 standing rest breaks to perform squats and hip flexion to stretch back.           PT Education - 09/28/16 0949    Education provided Yes   Education Details PT educated pt on informing MD regarding bladder incontinence and that pelvic health PT (after current PT POC) might be helpful to improve incontinence.   Person(s) Educated Patient   Methods Explanation   Comprehension Verbalized understanding          PT Short Term Goals - 08/17/16 1427      PT SHORT TERM GOAL #1   Title same as LTGs           PT Long Term Goals - 09/14/16 1505      PT LONG TERM GOAL #1   Title Pt will be IND in HEP in order to improve strength, balance, pain, and flexibility. TARGET DATE FOR ALL LTGS: 10/12/16   Baseline original POC: 09/14/16, new POC will include unmet and new LTGs: 10/12/16   Status Achieved     PT LONG TERM GOAL #2   Title Pt will improve FGA score to >/=29/30 to decr. falls risk.    Baseline 09/12/16: 26/30, improved from last check just not to goal   Status On-going     PT LONG TERM GOAL #3   Title Pt will report no falls over the last 2 weeks to improve safety during ADLs.    Baseline 09/12/16: pt reports not falls within the last week   Status Achieved     PT LONG TERM GOAL #4   Title Pt will tolerated dynamic standing balance activities for >/=10 minutes, with pain </=7/10, to improve quality of life and safety during ADLs.    Baseline 09/14/16: pt's baseline pain 7.5/10 today   Status Not Met     PT LONG TERM GOAL #5   Title Pt will amb. 1000' over grassy/hills/mulch terrain while performing dynamic gait activities, IND, in order to improve functional mobility.    Baseline 09/12/16: met today   Status Revised     Additional Long Term Goals   Additional Long Term Goals  Yes     PT LONG TERM GOAL #6   Title Pt will be IND in progressed HEP to improve balance, flexibility, strength, and endurance.   Status New               Plan - 09/28/16 0945    Clinical Impression Statement  Pt demonstrated progress, as she was able to tolerate high level balance activities on compliant surfaces and progressed to requiring less assist with eyes closed. Pt continues to require contract/relax technique and stretches at beginning of session to reduce pain, in order to participate in therapy in the morning (when pain is increased). Continue with POC.    Rehab Potential Good   Clinical Impairments Affecting Rehab Potential hx of MS   PT Frequency 2x / week  PT requesting add'l 2x/week for 4 weeks   PT Duration 4 weeks   PT Treatment/Interventions ADLs/Self Care Home Management;Biofeedback;Canalith Repostioning;Electrical Stimulation;Neuromuscular re-education;Cognitive remediation;Balance training;Therapeutic exercise;Therapeutic activities;Manual techniques;Functional mobility training;Stair training;Gait training;DME Instruction;Orthotic Fit/Training;Vestibular;Patient/family education   PT Next Visit Plan Continue standing balance and gait on uneven surface.   Consulted and Agree with Plan of Care Patient      Patient will benefit from skilled therapeutic intervention in order to improve the following deficits and impairments:  Abnormal gait, Decreased endurance, Decreased mobility, Decreased balance, Decreased coordination, Impaired flexibility, Decreased cognition, Dizziness, Decreased strength, Impaired sensation  Visit Diagnosis: Other abnormalities of gait and mobility  Unsteadiness on feet  Pain in left lower leg  Pain in right leg     Problem List Patient Active Problem List   Diagnosis Date Noted  . Anxiety state, unspecified 01/07/2013  . Multiple sclerosis ([facility]) 09/17/2012    Mychael Soots L 09/28/2016, 9:49 AM  [facility] [address] [place], [place], [address] Phone: [phone]   Fax:  [phone]  Name: [name] MRN: [id number] Date of Birth: August 28, 1970  Geoffry Paradise, PT,DPT 09/28/16 9:49 AM Phone: [phone] Fax: [phone]

## 2016-10-03 ENCOUNTER — Ambulatory Visit: Payer: Medicare Other

## 2016-10-03 DIAGNOSIS — M6281 Muscle weakness (generalized): Secondary | ICD-10-CM

## 2016-10-03 DIAGNOSIS — R2689 Other abnormalities of gait and mobility: Secondary | ICD-10-CM | POA: Diagnosis not present

## 2016-10-03 DIAGNOSIS — M79604 Pain in right leg: Secondary | ICD-10-CM

## 2016-10-03 DIAGNOSIS — R2681 Unsteadiness on feet: Secondary | ICD-10-CM

## 2016-10-03 DIAGNOSIS — M79662 Pain in left lower leg: Secondary | ICD-10-CM

## 2016-10-03 NOTE — Therapy (Signed)
[facility] [address] [place], [place], [address] Phone: [phone]   Fax:  [phone]  Physical Therapy Treatment  Patient Details  [name] MRN: [id number] Date of Birth: 1971-01-01 Referring Provider: Dr. Leta Baptist  Encounter Date: 10/03/2016      PT End of Session - 10/03/16 1333    Visit Number 9   Number of Visits 18   Date for PT Re-Evaluation 10/16/16   Authorization Type Medicare and UHC: G-CODE AND PROGRESS NOTE EVERY 10TH VISIT.    PT Start Time [phone]  pt late   PT Stop Time 0930   PT Time Calculation (min) 38 min      Past Medical History:  Diagnosis Date  . Anxiety and depression   . Arthritis   . Asthma   . Chronic pain    back,legs,neck  . Complication of anesthesia    cries  . Depression   . Fibromyalgia   . Gastroenteritis, acute    w/ dehydration  . Hypothyroidism   . Migraine headache   . Multiple allergies   . Multiple sclerosis ([facility])   . Neuromuscular disorder (HCC)    neuropathy pain  . Ovarian mass November 11 2012   left   . Rheumatic disease     Past Surgical History:  Procedure Laterality Date  . BUNIONECTOMY     both feet  . CARPAL TUNNEL RELEASE Left 10/28/2012   Procedure: CARPAL TUNNEL RELEASE;  Surgeon: Wynonia Sours, MD;  Location: [facility];  Service: Orthopedics;  Laterality: Left;  . CHOLECYSTECTOMY  09/2014   in [place], portion of liver removed- cyst  . COLONOSCOPY W/ POLYPECTOMY  2012  . DILATION AND CURETTAGE OF UTERUS    . ELBOW ARTHROSCOPY     rt  . ELBOW SURGERY Right may 2014   tendon rair  . FOOT NEUROMA SURGERY     both feet  . HERNIA REPAIR  12/6166   umbilical  . MYOMECTOMY ABDOMINAL APPROACH    . OPEN REDUCTION INTERNAL FIXATION (ORIF) DISTAL RADIAL FRACTURE Left 10/28/2012   Procedure: OPEN REDUCTION INTERNAL FIXATION (ORIF) DISTAL RADIAL FRACTURE;  Surgeon: Wynonia Sours, MD;  Location: [facility];   Service: Orthopedics;  Laterality: Left;  . OVARY SURGERY     rt 1987  . ROTATOR CUFF REPAIR  2000   rt  . TONSILLECTOMY    . WRIST SURGERY Right 2006    There were no vitals filed for this visit.      Subjective Assessment - 10/03/16 0853    Subjective Pt denied falls or changes since last visit. Pt reported LBP incr. this morning.   Pertinent History Anxiety, MS, fibromyalgia (per pt), RA (per pt), asthma, hypothyroidism, pt had gullbladder and hernia surgery in 2017-no precautions per pt   Patient Stated Goals Relieve some pain and strength my legs, try to get "the wobbliness out".    Currently in Pain? Yes   Pain Score 8    Pain Location Back   Pain Orientation Lower   Pain Descriptors / Indicators Sharp;Aching;Throbbing;Tightness   Pain Type Chronic pain;Neuropathic pain   Pain Radiating Towards BLEs   Pain Onset More than a month ago   Pain Frequency Constant   Aggravating Factors  worse in the morning, standing for prolonged periods of time   Pain Relieving Factors stretches, rest and heat         Therex:  Pt performed supine and hooklying stretches to  decr. Back and hip pain in order to perform balance and gait activities. Pt and PT performed contract and relax technique with DKTC position (hip flexion and ext holds) with 10 second hold x4 reps. -DKTC with rocking in lateral directions x4 reps.  -Hip flexor stretch in supine with 1-2 minute holds per LE, LE off EOB. Pt reported pain reduced to 7/10 after stretches.                     [facility] Adult PT Treatment/Exercise - 10/03/16 0919      Ambulation/Gait   Ambulation/Gait Yes   Ambulation/Gait Assistance 5: Supervision;4: Min guard   Ambulation/Gait Assistance Details Pt amb. over even and uneven terrain while performing head turns and while traversing inclines/declines. Cues to improve wt. shifting and narrow BOS.   Ambulation Distance (Feet) 700 Feet   Assistive device None   Gait Pattern  Step-through pattern;Decreased stride length;Decreased arm swing - right;Decreased arm swing - left   Ambulation Surface Level;Indoor;Unlevel;Outdoor;Paved;Gravel;Grass     High Level Balance   High Level Balance Activities Tandem walking;Backward walking;Braiding   High Level Balance Comments Performed with min guard and cues for technique (weight shifting) in mulch and gravel 2x10'. Pt also performed backward amb. and braiding 2x30'/activity with min guard and cues for technique.                PT Education - 10/03/16 1332    Education provided Yes   Education Details PT educated pt that goals would be assessed next week and then pt will likely be discharged from PT, based on progress.   Person(s) Educated Patient   Methods Explanation   Comprehension Verbalized understanding          PT Short Term Goals - 08/17/16 1427      PT SHORT TERM GOAL #1   Title same as LTGs           PT Long Term Goals - 09/14/16 1505      PT LONG TERM GOAL #1   Title Pt will be IND in HEP in order to improve strength, balance, pain, and flexibility. TARGET DATE FOR ALL LTGS: 10/12/16   Baseline original POC: 09/14/16, new POC will include unmet and new LTGs: 10/12/16   Status Achieved     PT LONG TERM GOAL #2   Title Pt will improve FGA score to >/=29/30 to decr. falls risk.    Baseline 09/12/16: 26/30, improved from last check just not to goal   Status On-going     PT LONG TERM GOAL #3   Title Pt will report no falls over the last 2 weeks to improve safety during ADLs.    Baseline 09/12/16: pt reports not falls within the last week   Status Achieved     PT LONG TERM GOAL #4   Title Pt will tolerated dynamic standing balance activities for >/=10 minutes, with pain </=7/10, to improve quality of life and safety during ADLs.    Baseline 09/14/16: pt's baseline pain 7.5/10 today   Status Not Met     PT LONG TERM GOAL #5   Title Pt will amb. 1000' over grassy/hills/mulch terrain while  performing dynamic gait activities, IND, in order to improve functional mobility.    Baseline 09/12/16: met today   Status Revised     Additional Long Term Goals   Additional Long Term Goals Yes     PT LONG TERM GOAL #6   Title Pt will be  IND in progressed HEP to improve balance, flexibility, strength, and endurance.   Status New               Plan - 10/03/16 1333    Clinical Impression Statement Pt continues to demonstrated progress, as she was able to amb. over uneven terrain without LOB. Postural sway was noted over grassy and mulch terrain 2/2 narrow BOS, which pt was able to correct with cues. Pt able to participate in PT with less rest breaks and less pain, when stretches and contract-relax techniques performed prior to activities. Pt would continue to benefit from skilled PT to improve safety during functional mobility.    Rehab Potential Good   Clinical Impairments Affecting Rehab Potential hx of MS   PT Frequency 2x / week  PT requesting add'l 2x/week for 4 weeks   PT Duration 4 weeks   PT Treatment/Interventions ADLs/Self Care Home Management;Biofeedback;Canalith Repostioning;Electrical Stimulation;Neuromuscular re-education;Cognitive remediation;Balance training;Therapeutic exercise;Therapeutic activities;Manual techniques;Functional mobility training;Stair training;Gait training;DME Instruction;Orthotic Fit/Training;Vestibular;Patient/family education   PT Next Visit Plan G-CODE AND PROGRESS NOTE. Stretches and muscle energy techniques prior to activity to decr. back/LE pain. Continue standing balance and gait on uneven surface.   Consulted and Agree with Plan of Care Patient      Patient will benefit from skilled therapeutic intervention in order to improve the following deficits and impairments:  Abnormal gait, Decreased endurance, Decreased mobility, Decreased balance, Decreased coordination, Impaired flexibility, Decreased cognition, Dizziness, Decreased strength,  Impaired sensation  Visit Diagnosis: Other abnormalities of gait and mobility  Unsteadiness on feet  Pain in left lower leg  Pain in right leg  Muscle weakness (generalized)     Problem List Patient Active Problem List   Diagnosis Date Noted  . Anxiety state, unspecified 01/07/2013  . Multiple sclerosis ([facility]) 09/17/2012    Koda Defrank L 10/03/2016, 1:36 PM  [facility] [address] [place], [place], [address] Phone: [phone]   Fax:  [phone]  [name] MRN: [id number] Date of Birth: Apr 01, 1971  Geoffry Paradise, PT,DPT 10/03/16 1:37 PM Phone: [phone] Fax: [phone]

## 2016-10-05 ENCOUNTER — Ambulatory Visit: Payer: Medicare Other | Admitting: Rehabilitation

## 2016-10-10 ENCOUNTER — Ambulatory Visit: Payer: Medicare Other

## 2016-10-10 DIAGNOSIS — M6281 Muscle weakness (generalized): Secondary | ICD-10-CM

## 2016-10-10 DIAGNOSIS — R2689 Other abnormalities of gait and mobility: Secondary | ICD-10-CM | POA: Diagnosis not present

## 2016-10-10 DIAGNOSIS — R2681 Unsteadiness on feet: Secondary | ICD-10-CM

## 2016-10-10 DIAGNOSIS — M79662 Pain in left lower leg: Secondary | ICD-10-CM

## 2016-10-10 DIAGNOSIS — M79604 Pain in right leg: Secondary | ICD-10-CM

## 2016-10-10 NOTE — Therapy (Addendum)
[facility] [address] [place], [place], [address] Phone: [phone]   Fax:  [phone]  Physical Therapy Treatment  Patient Details  Name: [name] MRN: [id number] Date of Birth: Oct 25, 1970 Referring Provider: Dr. Leta Baptist  Encounter Date: 10/10/2016      PT End of Session - 10/10/16 1226    Visit Number 10   Number of Visits 18   Date for PT Re-Evaluation 10/16/16   Authorization Type Medicare and UHC: G-CODE AND PROGRESS NOTE EVERY 10TH VISIT.    PT Start Time 1016   PT Stop Time 1057   PT Time Calculation (min) 41 min   Equipment Utilized During Treatment --  min guard to S prn   Activity Tolerance Patient tolerated treatment well   Behavior During Therapy WFL for tasks assessed/performed      Past Medical History:  Diagnosis Date  . Anxiety and depression   . Arthritis   . Asthma   . Chronic pain    back,legs,neck  . Complication of anesthesia    cries  . Depression   . Fibromyalgia   . Gastroenteritis, acute    w/ dehydration  . Hypothyroidism   . Migraine headache   . Multiple allergies   . Multiple sclerosis ([facility] [place])   . Neuromuscular disorder ([facility])    neuropathy pain  . Ovarian mass November 11 2012   left   . Rheumatic disease     Past Surgical History:  Procedure Laterality Date  . BUNIONECTOMY     both feet  . CARPAL TUNNEL RELEASE Left 10/28/2012   Procedure: CARPAL TUNNEL RELEASE;  Surgeon: Wynonia Sours, MD;  Location: [facility];  Service: Orthopedics;  Laterality: Left;  . CHOLECYSTECTOMY  09/2014   in [place], portion of liver removed- cyst  . COLONOSCOPY W/ POLYPECTOMY  2012  . DILATION AND CURETTAGE OF UTERUS    . ELBOW ARTHROSCOPY     rt  . ELBOW SURGERY Right may 2014   tendon rair  . FOOT NEUROMA SURGERY     both feet  . HERNIA REPAIR  07/4194   umbilical  . MYOMECTOMY ABDOMINAL APPROACH    . OPEN REDUCTION INTERNAL FIXATION (ORIF) DISTAL RADIAL  FRACTURE Left 10/28/2012   Procedure: OPEN REDUCTION INTERNAL FIXATION (ORIF) DISTAL RADIAL FRACTURE;  Surgeon: Wynonia Sours, MD;  Location: [facility];  Service: Orthopedics;  Laterality: Left;  . OVARY SURGERY     rt 1987  . ROTATOR CUFF REPAIR  2000   rt  . TONSILLECTOMY    . WRIST SURGERY Right 2006    There were no vitals filed for this visit.      Subjective Assessment - 10/10/16 1020    Subjective Pt denied falls since last visit. pt reported she has incr. pain today   Pertinent History Anxiety, MS, fibromyalgia (per pt), RA (per pt), asthma, hypothyroidism, pt had gullbladder and hernia surgery in 2017-no precautions per pt   Patient Stated Goals Relieve some pain and strength my legs, try to get "the wobbliness out".    Currently in Pain? Yes   Pain Score 9    Pain Location Back   Pain Orientation Lower   Pain Descriptors / Indicators Other (Comment);Aching;Throbbing  hips feel "overstretched"   Pain Radiating Towards Hips/BLEs   Pain Onset More than a month ago   Pain Frequency Constant   Aggravating Factors  worse in the morning, standing for prolonged periods of time  Pain Relieving Factors stretches, rest and heat            [facility] PT Assessment - 10/10/16 1034      Functional Gait  Assessment   Gait assessed  Yes   Gait Level Surface Walks 20 ft in less than 5.5 sec, no assistive devices, good speed, no evidence for imbalance, normal gait pattern, deviates no more than 6 in outside of the 12 in walkway width.  4.7 sec.   Change in Gait Speed Able to smoothly change walking speed without loss of balance or gait deviation. Deviate no more than 6 in outside of the 12 in walkway width.   Gait with Horizontal Head Turns Performs head turns smoothly with no change in gait. Deviates no more than 6 in outside 12 in walkway width   Gait with Vertical Head Turns Performs head turns with no change in gait. Deviates no more than 6 in outside 12 in walkway  width.   Gait and Pivot Turn Pivot turns safely within 3 sec and stops quickly with no loss of balance.   Step Over Obstacle Is able to step over 2 stacked shoe boxes taped together (9 in total height) without changing gait speed. No evidence of imbalance.   Gait with Narrow Base of Support Is able to ambulate for 10 steps heel to toe with no staggering.   Gait with Eyes Closed Walks 20 ft, uses assistive device, slower speed, mild gait deviations, deviates 6-10 in outside 12 in walkway width. Ambulates 20 ft in less than 9 sec but greater than 7 sec.  7.3 sec.   Ambulating Backwards Walks 20 ft, no assistive devices, good speed, no evidence for imbalance, normal gait   Steps Alternating feet, no rail.   Total Score 29      Therex: Pt performed supine and hooklying stretches to decr. Back and hip pain in order to perform balance and gait activities. Pt and PT performed contract and relax technique with DKTC position (hip flexion and ext holds) with 10 second hold x4 reps. -DKTC with rocking in lateral directions x4 reps.  -Hip flexor stretch in supine with 1-2 minute holds per LE, LE off EOB. Pt reported pain reduced to 8/10 after stretches.                  [facility] Adult PT Treatment/Exercise - 10/10/16 1047      Ambulation/Gait   Ambulation/Gait Yes   Ambulation/Gait Assistance 7: Independent   Ambulation/Gait Assistance Details Pt amb. over even/uneven terrain while performing head turns and going up/down hills without LOB.    Ambulation Distance (Feet) 1000 Feet   Assistive device None   Gait Pattern Step-through pattern;Within Functional Limits   Ambulation Surface Level;Unlevel;Indoor;Outdoor;Paved;Gravel;Grass;Other (comment)  rubber mulch     High Level Balance   High Level Balance Activities Side stepping;Braiding;Other (comment)  sidestep with mini-squat   High Level Balance Comments Pt performed with min guard to S over even terrain with cues and demo for  technique. Pt performed 4x7' and B SLS with 10 sec. hold. Please see pt instructions for HEP details.                PT Education - 10/10/16 1225    Education provided Yes   Education Details PT discussed goal progress and outcome measure results and that next visit will be last visit.  PT provided pt with progressed balance HEP.    Person(s) Educated Patient   Methods Explanation;Demonstration;Verbal cues;Handout  Comprehension Verbalized understanding;Returned demonstration          PT Short Term Goals - 08/17/16 1427      PT SHORT TERM GOAL #1   Title same as LTGs           PT Long Term Goals - 10-11-2016 1228      PT LONG TERM GOAL #1   Title Pt will be IND in HEP in order to improve strength, balance, pain, and flexibility. TARGET DATE FOR ALL LTGS: 10/12/16   Baseline original POC: 09/14/16, new POC will include unmet and new LTGs: 10/12/16   Status Achieved     PT LONG TERM GOAL #2   Title Pt will improve FGA score to >/=29/30 to decr. falls risk.    Baseline 09/12/16: 26/30, improved from last check just not to goal   Status Achieved     PT LONG TERM GOAL #3   Title Pt will report no falls over the last 2 weeks to improve safety during ADLs.    Baseline 09/12/16: pt reports not falls within the last week   Status Achieved     PT LONG TERM GOAL #4   Title Pt will tolerated dynamic standing balance activities for >/=10 minutes, with pain </=7/10, to improve quality of life and safety during ADLs.    Baseline 09/14/16: pt's baseline pain 7.5/10 today   Status On-going     PT LONG TERM GOAL #5   Title Pt will amb. 1000' over grassy/hills/mulch terrain while performing dynamic gait activities, IND, in order to improve functional mobility.    Baseline 09/12/16: met today   Status Achieved     PT LONG TERM GOAL #6   Title Pt will be IND in progressed HEP to improve balance, flexibility, strength, and endurance.   Status New               Plan - 2016-10-11 1226     Clinical Impression Statement Pt demonstrated progress as she met LTGs 2, 3, and 5. PT will complete goal assessment next session and likely d/c pt, due to excellent progress. Continue with POC.    Rehab Potential Good   Clinical Impairments Affecting Rehab Potential hx of MS   PT Frequency 2x / week  PT requesting add'l 2x/week for 4 weeks   PT Duration 4 weeks   PT Treatment/Interventions ADLs/Self Care Home Management;Biofeedback;Canalith Repostioning;Electrical Stimulation;Neuromuscular re-education;Cognitive remediation;Balance training;Therapeutic exercise;Therapeutic activities;Manual techniques;Functional mobility training;Stair training;Gait training;DME Instruction;Orthotic Fit/Training;Vestibular;Patient/family education   PT Next Visit Plan Assess remaining goals and d/c. Stretches and muscle energy techniques prior to activity to decr. back/LE pain.    Consulted and Agree with Plan of Care Patient      Patient will benefit from skilled therapeutic intervention in order to improve the following deficits and impairments:  Abnormal gait, Decreased endurance, Decreased mobility, Decreased balance, Decreased coordination, Impaired flexibility, Decreased cognition, Dizziness, Decreased strength, Impaired sensation  Visit Diagnosis: Other abnormalities of gait and mobility  Unsteadiness on feet  Pain in left lower leg  Pain in right leg  Muscle weakness (generalized)       G-Codes - 10/11/16 1229    Functional Assessment Tool Used (Outpatient Only) FGA: 29/30   Functional Limitation Mobility: Walking and moving around   Mobility: Walking and Moving Around Current Status (W9604) At least 1 percent but less than 20 percent impaired, limited or restricted   Mobility: Walking and Moving Around Goal Status (V4098) At least 1 percent but less than 20 percent  impaired, limited or restricted      Problem List Patient Active Problem List   Diagnosis Date Noted  . Anxiety  state, unspecified 01/07/2013  . Multiple sclerosis ([facility]) 09/17/2012    [place] Warriner L 10/10/2016, 12:31 PM  [facility] [address] [place], [place], [address] Phone: [phone]   Fax:  [phone]  Name: Hisayo Delossantos MRN: [id number] Date of Birth: [birth year]   Physical Therapy Progress Note  Dates of Reporting Period: 08/17/16 to 10/10/16  Objective Reports of Subjective Statement: See above.  Objective Measurements: See above.  Goal Update:      PT Long Term Goals - 10/10/16 1228      PT LONG TERM GOAL #1   Title Pt will be IND in HEP in order to improve strength, balance, pain, and flexibility. TARGET DATE FOR ALL LTGS: 10/12/16   Baseline original POC: 09/14/16, new POC will include unmet and new LTGs: 10/12/16   Status Achieved     PT LONG TERM GOAL #2   Title Pt will improve FGA score to >/=29/30 to decr. falls risk.    Baseline 09/12/16: 26/30, improved from last check just not to goal   Status Achieved     PT LONG TERM GOAL #3   Title Pt will report no falls over the last 2 weeks to improve safety during ADLs.    Baseline 09/12/16: pt reports not falls within the last week   Status Achieved     PT LONG TERM GOAL #4   Title Pt will tolerated dynamic standing balance activities for >/=10 minutes, with pain </=7/10, to improve quality of life and safety during ADLs.    Baseline 09/14/16: pt's baseline pain 7.5/10 today   Status On-going     PT LONG TERM GOAL #5   Title Pt will amb. 1000' over grassy/hills/mulch terrain while performing dynamic gait activities, IND, in order to improve functional mobility.    Baseline 09/12/16: met today   Status Achieved     PT LONG TERM GOAL #6   Title Pt will be IND in progressed HEP to improve balance, flexibility, strength, and endurance.   Status New       Plan: D/c pt next session, once all goals are assessed.  Reason Skilled Services are Required: To  improve safety during functional mobility.    Geoffry Paradise, PT,DPT 10/10/16 12:31 PM Phone: [phone] Fax: [phone]

## 2016-10-10 NOTE — Patient Instructions (Addendum)
Braiding    At counter: Move to side: 1) cross right leg in front of left, 2) bring back leg out to side, then 3) cross right leg behind left, 4) bring left leg out to side. Continue sequence in same direction. Reverse sequence, moving in opposite direction. Repeat sequence __4__ laps at counter per session. Do __1__ sessions per day.  Copyright  VHI. All rights reserved.   Side Stepping With Semi-Squat    Perform at counter: Step to side, hand at counter as needed and legs while lowering to semi-squat position keeping heels on ground, side step to the side. Perform __2_ laps side stepping right. Perform _2__ laps side stepping left.  Copyright  VHI. All rights reserved.   Single Leg - Eyes Open    Holding support, lift right leg while maintaining balance over other leg. Progress to removing hands from support surface for longer periods of time. Hold__10-30__ seconds. Repeat with other leg. Repeat ___3_ times per session. Do __1__ sessions per day.  Copyright  VHI. All rights reserved.

## 2016-10-12 ENCOUNTER — Ambulatory Visit: Payer: Medicare Other | Attending: Diagnostic Neuroimaging

## 2016-10-12 DIAGNOSIS — M6281 Muscle weakness (generalized): Secondary | ICD-10-CM | POA: Insufficient documentation

## 2016-10-12 DIAGNOSIS — R2681 Unsteadiness on feet: Secondary | ICD-10-CM | POA: Diagnosis present

## 2016-10-12 DIAGNOSIS — R2689 Other abnormalities of gait and mobility: Secondary | ICD-10-CM | POA: Insufficient documentation

## 2016-10-12 NOTE — Patient Instructions (Signed)
Braiding    At counter: Move to side: 1) cross right leg in front of left, 2) bring back leg out to side, then 3) cross right leg behind left, 4) bring left leg out to side. Continue sequence in same direction. Reverse sequence, moving in opposite direction. Repeat sequence __4__ laps at counter per session. Do __1__ sessions per day.  Copyright  VHI. All rights reserved.   Side Stepping With Semi-Squat    Perform at counter: Step to side, hand at counter as needed and legs while lowering to semi-squat position keeping heels on ground, side step to the side. Perform __2_ laps side stepping right. Perform _2__ laps side stepping left.  Copyright  VHI. All rights reserved.   Single Leg - Eyes Open    Holding support, lift right leg while maintaining balance over other leg. Progress to removing hands from support surface for longer periods of time. Hold__10-30__ seconds. Repeat with other leg. Repeat ___3_ times per session. Do __1__ sessions per day.  Copyright  VHI. All rights reserved.   Backward    Walk backwards with eyes open. Take 10 even steps, making sure each foot lifts off floor. Repeat for __4__ laps per session. Do __1__ sessions per day.   Copyright  VHI. All rights reserved. Feet Heel-Toe "Tandem"    Perform at counter, hold counter with one hand, walk a straight line bringing one foot directly in front of the other. Repeat for __4__ laps per session. Do _1___ sessions per day.  Copyright  VHI. All rights reserved. Walking    Perform at counter, with eyes closed. Walk on solid surface with hand close to support. Attempt to keep hand away from support for longer periods of time, keeping a straight path. Repeat __4__ times along counter per session. Do __1__ sessions per day.   Copyright  VHI. All rights reserved. "I love a Licensed conveyancer    March along counter, holding on with one hand, make sure knees and hips reach 90 degrees. Repeat  __4__ times along counter. Do __1__ sessions per day.  http://gt2.exer.us/345  Copyright  VHI. All rights reserved.  HIP: Hamstrings - Short Sitting    Rest leg on raised surface. Keep knee straight. Lift chest. Hold __30_ seconds. Repeat with other leg.  __3_ reps per set, _2-3__ sets per day, __7_ days per week  Copyright  VHI. All rights reserved.   Piriformis Stretch, Sitting    Sit, one ankle on opposite knee, same-side hand on crossed knee. Push down on knee, keeping spine straight. Lean torso forward, with flat back, until tension is felt in hamstrings and gluteals of crossed-leg side. Hold _30__ seconds. Repeat with other leg. Repeat _3__ times per session. Do _2-3__ sessions per day. Perform 7 days a week.   Copyright  VHI. All rights reserved.   Hip Flexor Stretch    Lying on back near edge of bed, bend one leg, foot flat. Hang other leg over edge, relaxed, thigh resting entirely on bed for __2__ minutes. Repeat with other leg. Repeat __1__ times. Do __2-3__ sessions per day. Perform every day. Advanced Exercise: Bend knee back keeping thigh in contact with bed.  http://gt2.exer.us/347   Copyright  VHI. All rights reserved.   Flow    Inhale, press spine toward ceiling like a Halloween cat. Keeping strength in arms and abdominals, exhale to soften spine through neutral and into cow pose. Open chest and arch back. Initiate movement between cat and cow at tailbone, one vertebrae  at a time. Repeat __10__ times.  Copyright  VHI. All rights reserved.     Copyright  VHI. All rights reserved.  BACK: Child's Pose (Sciatica)    Sit in knee-chest position and reach arms forward. Separate knees for comfort. Hold position for _30__ seconds. Repeat _1-2__ times. Do _2-3__ times per day.  Copyright  VHI. All rights reserved.

## 2016-10-12 NOTE — Therapy (Signed)
[facility] [address] [place], [place], [address] Phone: [phone]   Fax:  [phone]  Physical Therapy Treatment  Patient Details  Name: [name] MRN: [id number] Date of Birth: 08-29-1970 Referring Provider: Dr. Leta Baptist  Encounter Date: 10/12/2016      PT End of Session - 10/12/16 1347    Visit Number 11   Number of Visits 18   Date for PT Re-Evaluation 10/16/16   Authorization Type Medicare and U[employer]: G-CODE AND PROGRESS NOTE EVERY 10TH VISIT.    PT Start Time 1315   PT Stop Time 1341   PT Time Calculation (min) 26 min   Equipment Utilized During Treatment --  S prn   Activity Tolerance Patient tolerated treatment well   Behavior During Therapy WFL for tasks assessed/performed      Past Medical History:  Diagnosis Date  . Anxiety and depression   . Arthritis   . Asthma   . Chronic pain    back,legs,neck  . Complication of anesthesia    cries  . Depression   . Fibromyalgia   . Gastroenteritis, acute    w/ dehydration  . Hypothyroidism   . Migraine headache   . Multiple allergies   . Multiple sclerosis ([place])   . Neuromuscular disorder ([facility])    neuropathy pain  . Ovarian mass November 11 2012   left   . Rheumatic disease     Past Surgical History:  Procedure Laterality Date  . BUNIONECTOMY     both feet  . CARPAL TUNNEL RELEASE Left 10/28/2012   Procedure: CARPAL TUNNEL RELEASE;  Surgeon: Wynonia Sours, MD;  Location: [facility];  Service: Orthopedics;  Laterality: Left;  . CHOLECYSTECTOMY  09/2014   in [place], portion of liver removed- cyst  . COLONOSCOPY W/ POLYPECTOMY  2012  . DILATION AND CURETTAGE OF UTERUS    . ELBOW ARTHROSCOPY     rt  . ELBOW SURGERY Right may 2014   tendon rair  . FOOT NEUROMA SURGERY     both feet  . HERNIA REPAIR  09/352   umbilical  . MYOMECTOMY ABDOMINAL APPROACH    . OPEN REDUCTION INTERNAL FIXATION (ORIF) DISTAL RADIAL FRACTURE Left  10/28/2012   Procedure: OPEN REDUCTION INTERNAL FIXATION (ORIF) DISTAL RADIAL FRACTURE;  Surgeon: Wynonia Sours, MD;  Location: [facility];  Service: Orthopedics;  Laterality: Left;  . OVARY SURGERY     rt 1987  . ROTATOR CUFF REPAIR  2000   rt  . TONSILLECTOMY    . WRIST SURGERY Right 2006    There were no vitals filed for this visit.      Subjective Assessment - 10/12/16 1319    Subjective Pt denied falls or changes since last visit.    Pertinent History Anxiety, MS, fibromyalgia (per pt), RA (per pt), asthma, hypothyroidism, pt had gullbladder and hernia surgery in 2017-no precautions per pt   Patient Stated Goals Relieve some pain and strength my legs, try to get "the wobbliness out".    Currently in Pain? Yes   Pain Score 8    Pain Location Back  pt reports the pain is more in the back, hip and leg muscles   Pain Orientation Lower   Pain Type Chronic pain;Neuropathic pain   Pain Onset More than a month ago   Pain Frequency Constant   Aggravating Factors  walking   Pain Relieving Factors rest      Therex and Neuro re-ed:  Pt performed balance (backwards amb, amb. With head turns, and amb. With eyes closed, and B SLS) and therex HEP (sidestep with mini squat and stretches). No incr. Pain noted during session. Please see pt instructions for HEP details.                          PT Education - 10/12/16 1347    Education provided Yes   Education Details PT reviewed HEP and updated as tolerated. PT discussed goal progress and d/c. Pt agreeable.    Person(s) Educated Patient   Methods Explanation;Handout   Comprehension Verbalized understanding;Returned demonstration          PT Short Term Goals - 08/17/16 1427      PT SHORT TERM GOAL #1   Title same as LTGs           PT Long Term Goals - 10/12/16 1352      PT LONG TERM GOAL #1   Title Pt will be IND in HEP in order to improve strength, balance, pain, and flexibility. TARGET  DATE FOR ALL LTGS: 10/12/16   Baseline original POC: 09/14/16, new POC will include unmet and new LTGs: 10/12/16   Status Achieved     PT LONG TERM GOAL #2   Title Pt will improve FGA score to >/=29/30 to decr. falls risk.    Baseline 09/12/16: 26/30, improved from last check just not to goal   Status Achieved     PT LONG TERM GOAL #3   Title Pt will report no falls over the last 2 weeks to improve safety during ADLs.    Baseline 09/12/16: pt reports not falls within the last week   Status Achieved     PT LONG TERM GOAL #4   Title Pt will tolerated dynamic standing balance activities for >/=10 minutes, with pain </=7/10, to improve quality of life and safety during ADLs.    Baseline 10/12/16: pt's baseline pain 8/10 today   Status Partially Met     PT LONG TERM GOAL #5   Title Pt will amb. 1000' over grassy/hills/mulch terrain while performing dynamic gait activities, IND, in order to improve functional mobility.    Baseline 09/12/16: met today   Status Achieved     PT LONG TERM GOAL #6   Title Pt will be IND in progressed HEP to improve balance, flexibility, strength, and endurance.   Status Achieved               Plan - 10/12/16 1349    Clinical Impression Statement Pt met LTG 6 and partially met LTG 4, as her baseline pain was 8/10. Pt is being discharged today, based on excellent progress. Please see d/c summary for details.    Rehab Potential Good   Clinical Impairments Affecting Rehab Potential hx of MS   PT Frequency 2x / week  PT requesting add'l 2x/week for 4 weeks   PT Duration 4 weeks   PT Treatment/Interventions ADLs/Self Care Home Management;Biofeedback;Canalith Repostioning;Electrical Stimulation;Neuromuscular re-education;Cognitive remediation;Balance training;Therapeutic exercise;Therapeutic activities;Manual techniques;Functional mobility training;Stair training;Gait training;DME Instruction;Orthotic Fit/Training;Vestibular;Patient/family education   PT Next Visit Plan  d/c   Consulted and Agree with Plan of Care Patient      Patient will benefit from skilled therapeutic intervention in order to improve the following deficits and impairments:  Abnormal gait, Decreased endurance, Decreased mobility, Decreased balance, Decreased coordination, Impaired flexibility, Decreased cognition, Dizziness, Decreased strength, Impaired sensation  Visit Diagnosis: Other abnormalities of gait and  mobility  Muscle weakness (generalized)  Unsteadiness on feet       G-Codes - 2016-11-07 1352    Functional Assessment Tool Used (Outpatient Only) FGA: 29/30   Functional Limitation Mobility: Walking and moving around   Mobility: Walking and Moving Around Goal Status [phone]) At least 1 percent but less than 20 percent impaired, limited or restricted   Mobility: Walking and Moving Around Discharge Status [id number]) At least 1 percent but less than 20 percent impaired, limited or restricted      Problem List Patient Active Problem List   Diagnosis Date Noted  . Anxiety state, unspecified 01/07/2013  . Multiple sclerosis ([place]) 09/17/2012    Miller,Jennifer L 11-07-2016, 1:53 PM  [facility] [address] [place], [place], [address] Phone: [phone]   Fax:  [phone]  Name: [name] MRN: [id number] Date of Birth: June 20, 1970  PHYSICAL THERAPY DISCHARGE SUMMARY  Visits from Start of Care: 11  Current functional level related to goals / functional outcomes:     PT Long Term Goals - 07-Nov-2016 1352      PT LONG TERM GOAL #1   Title Pt will be IND in HEP in order to improve strength, balance, pain, and flexibility. TARGET DATE FOR ALL LTGS: 11-07-16   Baseline original POC: 09/14/16, new POC will include unmet and new LTGs: Nov 07, 2016   Status Achieved     PT LONG TERM GOAL #2   Title Pt will improve FGA score to >/=29/30 to decr. falls risk.    Baseline 09/12/16: 26/30, improved from last check just  not to goal   Status Achieved     PT LONG TERM GOAL #3   Title Pt will report no falls over the last 2 weeks to improve safety during ADLs.    Baseline 09/12/16: pt reports not falls within the last week   Status Achieved     PT LONG TERM GOAL #4   Title Pt will tolerated dynamic standing balance activities for >/=10 minutes, with pain </=7/10, to improve quality of life and safety during ADLs.    Baseline Nov 07, 2016: pt's baseline pain 8/10 today   Status Partially Met     PT LONG TERM GOAL #5   Title Pt will amb. 1000' over grassy/hills/mulch terrain while performing dynamic gait activities, IND, in order to improve functional mobility.    Baseline 09/12/16: met today   Status Achieved     PT LONG TERM GOAL #6   Title Pt will be IND in progressed HEP to improve balance, flexibility, strength, and endurance.   Status Achieved        Remaining deficits: Pain   Education / Equipment: HEP  Plan: Patient agrees to discharge.  Patient goals were met. Patient is being discharged due to meeting the stated rehab goals.  ?????         Geoffry Paradise, PT,DPT 11-07-2016 1:56 PM Phone: [phone] Fax: [phone]

## 2016-11-30 ENCOUNTER — Ambulatory Visit (INDEPENDENT_AMBULATORY_CARE_PROVIDER_SITE_OTHER): Payer: Medicare Other | Admitting: Diagnostic Neuroimaging

## 2016-11-30 ENCOUNTER — Encounter: Payer: Self-pay | Admitting: Diagnostic Neuroimaging

## 2016-11-30 VITALS — BP 114/76 | HR 71 | Ht 67.0 in | Wt 174.4 lb

## 2016-11-30 DIAGNOSIS — R269 Unspecified abnormalities of gait and mobility: Secondary | ICD-10-CM | POA: Diagnosis not present

## 2016-11-30 DIAGNOSIS — G35 Multiple sclerosis: Secondary | ICD-10-CM

## 2016-11-30 DIAGNOSIS — R413 Other amnesia: Secondary | ICD-10-CM

## 2016-11-30 DIAGNOSIS — F329 Major depressive disorder, single episode, unspecified: Secondary | ICD-10-CM

## 2016-11-30 DIAGNOSIS — M79605 Pain in left leg: Secondary | ICD-10-CM | POA: Diagnosis not present

## 2016-11-30 DIAGNOSIS — M79604 Pain in right leg: Secondary | ICD-10-CM | POA: Diagnosis not present

## 2016-11-30 DIAGNOSIS — F32A Depression, unspecified: Secondary | ICD-10-CM

## 2016-11-30 MED ORDER — OXYBUTYNIN CHLORIDE ER 5 MG PO TB24: 5.0000 mg | ORAL_TABLET | Freq: Every day | ORAL | 6 refills | Status: DC
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
## 2016-12-15 ENCOUNTER — Ambulatory Visit
Admission: RE | Admit: 2016-12-15 | Discharge: 2016-12-15 | Disposition: A | Discharge status: Home / Self Care | Payer: Medicare Other | Source: Ambulatory Visit | Attending: Diagnostic Neuroimaging | Admitting: Diagnostic Neuroimaging

## 2016-12-15 DIAGNOSIS — G35 Multiple sclerosis: Secondary | ICD-10-CM

## 2016-12-15 DIAGNOSIS — F329 Major depressive disorder, single episode, unspecified: Secondary | ICD-10-CM

## 2016-12-15 DIAGNOSIS — M79605 Pain in left leg: Secondary | ICD-10-CM

## 2016-12-15 DIAGNOSIS — M79604 Pain in right leg: Secondary | ICD-10-CM

## 2016-12-15 DIAGNOSIS — F32A Depression, unspecified: Secondary | ICD-10-CM

## 2016-12-15 MED ORDER — GADOBENATE DIMEGLUMINE 529 MG/ML IV SOLN
15.0000 mL | Freq: Once | INTRAVENOUS | Status: AC | PRN
  Administered 2016-12-15: 15 mL via INTRAVENOUS

## 2016-12-16 ENCOUNTER — Other Ambulatory Visit: Payer: Medicare Other

## 2016-12-20 ENCOUNTER — Telehealth: Payer: Self-pay | Admitting: Diagnostic Neuroimaging

## 2016-12-20 ENCOUNTER — Telehealth: Payer: Self-pay | Admitting: *Deleted

## 2016-12-20 NOTE — Telephone Encounter (Signed)
Patient called office in reference to see if MRI results have been completed.  Please call °

## 2016-12-20 NOTE — Telephone Encounter (Signed)
Spoke with patient and informed her that her MRI brain is stable, no changes from last year. Dr Marjory Lies will continue with her current treatment plan. She verbalized understanding, appreciation.

## 2017-06-04 ENCOUNTER — Ambulatory Visit: Payer: Medicare Other | Admitting: Diagnostic Neuroimaging

## 2017-06-25 ENCOUNTER — Ambulatory Visit (INDEPENDENT_AMBULATORY_CARE_PROVIDER_SITE_OTHER): Payer: Medicare Other | Admitting: Diagnostic Neuroimaging

## 2017-06-25 ENCOUNTER — Encounter: Payer: Self-pay | Admitting: Diagnostic Neuroimaging

## 2017-06-25 VITALS — BP 114/72 | HR 91 | Wt 175.8 lb

## 2017-06-25 DIAGNOSIS — Z79899 Other long term (current) drug therapy: Secondary | ICD-10-CM | POA: Diagnosis not present

## 2017-06-25 DIAGNOSIS — R269 Unspecified abnormalities of gait and mobility: Secondary | ICD-10-CM | POA: Diagnosis not present

## 2017-06-25 DIAGNOSIS — G35 Multiple sclerosis: Secondary | ICD-10-CM | POA: Diagnosis not present

## 2017-06-25 DIAGNOSIS — R413 Other amnesia: Secondary | ICD-10-CM | POA: Diagnosis not present

## 2017-06-25 MED ORDER — OXYBUTYNIN CHLORIDE ER 5 MG PO TB24: 5.0000 mg | ORAL_TABLET | Freq: Every day | ORAL | 12 refills | Status: DC
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
## 2017-06-26 ENCOUNTER — Telehealth: Payer: Self-pay | Admitting: *Deleted

## 2017-06-26 LAB — CBC WITH DIFFERENTIAL/PLATELET
BASOS: 1 %
Basophils Absolute: 0 10*3/uL (ref 0.0–0.2)
EOS (ABSOLUTE): 0.4 10*3/uL (ref 0.0–0.4)
EOS: 5 %
HEMATOCRIT: 43.5 % (ref 34.0–46.6)
Hemoglobin: 14.8 g/dL (ref 11.1–15.9)
Immature Grans (Abs): 0 10*3/uL (ref 0.0–0.1)
Immature Granulocytes: 0 %
LYMPHS ABS: 1.9 10*3/uL (ref 0.7–3.1)
Lymphs: 25 %
MCH: 33.4 pg — ABNORMAL HIGH (ref 26.6–33.0)
MCHC: 34 g/dL (ref 31.5–35.7)
MCV: 98 fL — ABNORMAL HIGH (ref 79–97)
MONOS ABS: 0.8 10*3/uL (ref 0.1–0.9)
Monocytes: 10 %
Neutrophils Absolute: 4.6 10*3/uL (ref 1.4–7.0)
Neutrophils: 59 %
Platelets: 187 10*3/uL (ref 150–379)
RBC: 4.43 x10E6/uL (ref 3.77–5.28)
RDW: 13.1 % (ref 12.3–15.4)
WBC: 7.8 10*3/uL (ref 3.4–10.8)

## 2017-06-26 LAB — COMPREHENSIVE METABOLIC PANEL
A/G RATIO: 1.7 (ref 1.2–2.2)
ALBUMIN: 4.8 g/dL (ref 3.5–5.5)
ALT: 15 IU/L (ref 0–32)
AST: 20 IU/L (ref 0–40)
Alkaline Phosphatase: 81 IU/L (ref 39–117)
BUN / CREAT RATIO: 17 (ref 9–23)
BUN: 20 mg/dL (ref 6–24)
Bilirubin Total: 0.2 mg/dL (ref 0.0–1.2)
CALCIUM: 9.9 mg/dL (ref 8.7–10.2)
CO2: 21 mmol/L (ref 20–29)
Chloride: 104 mmol/L (ref 96–106)
Creatinine, Ser: 1.18 mg/dL — ABNORMAL HIGH (ref 0.57–1.00)
GFR calc Af Amer: 63 mL/min/{1.73_m2} (ref >59)
GFR, EST NON AFRICAN AMERICAN: 55 mL/min/{1.73_m2} — AB (ref >59)
GLOBULIN, TOTAL: 2.9 g/dL (ref 1.5–4.5)
Glucose: 120 mg/dL — ABNORMAL HIGH (ref 65–99)
POTASSIUM: 4.5 mmol/L (ref 3.5–5.2)
SODIUM: 142 mmol/L (ref 134–144)
Total Protein: 7.7 g/dL (ref 6.0–8.5)

## 2017-06-26 NOTE — Telephone Encounter (Signed)
Spoke with patient and informed her that her labs showed her kidney function is borderline affected. Encouraged her to stay hydrated and follow up with her PCP. Otherwise her labs are okay, and Dr Marjory Lies will continue her current plan. She stated she has an appointment with her PCP in a few weeks. She verbalized understanding, appreciation.

## 2017-08-12 DIAGNOSIS — B999 Unspecified infectious disease: Secondary | ICD-10-CM

## 2017-08-12 HISTORY — DX: Unspecified infectious disease: B99.9

## 2017-09-03 ENCOUNTER — Telehealth: Payer: Self-pay | Admitting: Diagnostic Neuroimaging

## 2017-09-03 NOTE — Telephone Encounter (Signed)
Pt called asking for RN Pincus Sanes. She was advised the RN's are with patients so she is requesting a call back re: being seen about a possible abscess on her spine.  Pt states she was in the hospital from 04-17 -04-20 she has been advised by her GI doctor to f/u with Dr Marjory Lies and would rather speak with RN re: scheduling

## 2017-09-04 NOTE — Telephone Encounter (Signed)
Spoke with patient and informed her Dr Marjory Lies is out of the office until May 2nd. Offered to give her an appointment on May 3rd. She stated her dr wanted her seen asap; she asked if another dr could see her. She was in [facility], had several scans done of her spine. She stated she is in a lot of pain in her back, has "pockets of fluid all down her spine to her tailbone". This RN advised will send this note to Dr Pearlean Brownie, work in dr to have him review [facility] records, scans to see if she needs to be seen sooner than May 3rd by another provider, in Dr Richrd Humbles absence. She verbalized understanding, appreciation.

## 2017-09-04 NOTE — Telephone Encounter (Signed)
Received call back from patient stating she forgot to tell this RN she's not has BM since 08/19/17. Her GI dr thinks it's due to her spinal issues.  [name] Bumpers PA of [facility], [name] (Dr Jason Fila, GI)  is having her do a bowel purge today. She took  Doculax this morning and will take  miralax, 1 bottle in two hours. She stated she is to stop Miralax and call if she begins vomiting, otherwise she'll call GI dr with results of bowel purge. She asked that this information be sent to Dr Pearlean Brownie as well.

## 2017-09-04 NOTE — Telephone Encounter (Signed)
I spoke to the patient who informed me that since April 9 she's been having low back pain with muscle tightness as well as not being able to move her bowels and is constipated. She denies any radicular pain or pain in the legs or trouble walking. She was seen in the emergency room at [facility] an MRI scan of the thoracolumbar spine showed soft tissue fluid collection on the left paraspinal region possibly abscess. Interventional radiology was consulted but did not feel the collection required drainage. Patient has seen and asked oncologist yesterday and plans to have further studies done. She has not yet been started on antibiotics. I recommended that the patient see her primary physician Dr. Primus Bravo at [facility] family practice to be started on antibiotics. Her 6 monthly infusion for multiple sclerosis treatment will have to be put on hold until her infection resolves. She was offered an appointment to see Dr. Marjory Lies early next week after his return. She was advised to go to the emergency room if she has leg weakness and difficulty walking She voiced understanding.

## 2017-09-05 ENCOUNTER — Inpatient Hospital Stay (HOSPITAL_COMMUNITY)
Admission: EM | Admit: 2017-09-05 | Discharge: 2017-09-12 | DRG: 500 | Disposition: A | Discharge status: Home Health | Payer: Medicare Other | Attending: Internal Medicine | Admitting: Internal Medicine

## 2017-09-05 ENCOUNTER — Inpatient Hospital Stay (HOSPITAL_COMMUNITY): Payer: Medicare Other

## 2017-09-05 ENCOUNTER — Encounter (HOSPITAL_COMMUNITY): Payer: Self-pay

## 2017-09-05 DIAGNOSIS — G40909 Epilepsy, unspecified, not intractable, without status epilepticus: Secondary | ICD-10-CM | POA: Diagnosis present

## 2017-09-05 DIAGNOSIS — K219 Gastro-esophageal reflux disease without esophagitis: Secondary | ICD-10-CM | POA: Diagnosis present

## 2017-09-05 DIAGNOSIS — J869 Pyothorax without fistula: Secondary | ICD-10-CM | POA: Diagnosis not present

## 2017-09-05 DIAGNOSIS — Z791 Long term (current) use of non-steroidal anti-inflammatories (NSAID): Secondary | ICD-10-CM

## 2017-09-05 DIAGNOSIS — M869 Osteomyelitis, unspecified: Secondary | ICD-10-CM | POA: Diagnosis not present

## 2017-09-05 DIAGNOSIS — Z95828 Presence of other vascular implants and grafts: Secondary | ICD-10-CM | POA: Diagnosis not present

## 2017-09-05 DIAGNOSIS — M797 Fibromyalgia: Secondary | ICD-10-CM | POA: Diagnosis present

## 2017-09-05 DIAGNOSIS — X58XXXA Exposure to other specified factors, initial encounter: Secondary | ICD-10-CM | POA: Diagnosis not present

## 2017-09-05 DIAGNOSIS — K5900 Constipation, unspecified: Secondary | ICD-10-CM | POA: Diagnosis present

## 2017-09-05 DIAGNOSIS — M4624 Osteomyelitis of vertebra, thoracic region: Secondary | ICD-10-CM | POA: Diagnosis present

## 2017-09-05 DIAGNOSIS — R0781 Pleurodynia: Secondary | ICD-10-CM | POA: Diagnosis not present

## 2017-09-05 DIAGNOSIS — F418 Other specified anxiety disorders: Secondary | ICD-10-CM | POA: Diagnosis not present

## 2017-09-05 DIAGNOSIS — L02213 Cutaneous abscess of chest wall: Secondary | ICD-10-CM | POA: Diagnosis not present

## 2017-09-05 DIAGNOSIS — B9689 Other specified bacterial agents as the cause of diseases classified elsewhere: Secondary | ICD-10-CM | POA: Diagnosis present

## 2017-09-05 DIAGNOSIS — Z881 Allergy status to other antibiotic agents status: Secondary | ICD-10-CM | POA: Diagnosis not present

## 2017-09-05 DIAGNOSIS — S2232XD Fracture of one rib, left side, subsequent encounter for fracture with routine healing: Secondary | ICD-10-CM | POA: Diagnosis not present

## 2017-09-05 DIAGNOSIS — J302 Other seasonal allergic rhinitis: Secondary | ICD-10-CM | POA: Diagnosis not present

## 2017-09-05 DIAGNOSIS — M5136 Other intervertebral disc degeneration, lumbar region: Secondary | ICD-10-CM | POA: Diagnosis not present

## 2017-09-05 DIAGNOSIS — Z87891 Personal history of nicotine dependence: Secondary | ICD-10-CM

## 2017-09-05 DIAGNOSIS — J90 Pleural effusion, not elsewhere classified: Secondary | ICD-10-CM

## 2017-09-05 DIAGNOSIS — Z79890 Hormone replacement therapy (postmenopausal): Secondary | ICD-10-CM | POA: Diagnosis not present

## 2017-09-05 DIAGNOSIS — Z91040 Latex allergy status: Secondary | ICD-10-CM | POA: Diagnosis not present

## 2017-09-05 DIAGNOSIS — M6008 Infective myositis, other site: Principal | ICD-10-CM | POA: Diagnosis present

## 2017-09-05 DIAGNOSIS — K529 Noninfective gastroenteritis and colitis, unspecified: Secondary | ICD-10-CM | POA: Diagnosis present

## 2017-09-05 DIAGNOSIS — S2242XA Multiple fractures of ribs, left side, initial encounter for closed fracture: Secondary | ICD-10-CM | POA: Diagnosis present

## 2017-09-05 DIAGNOSIS — M4620 Osteomyelitis of vertebra, site unspecified: Secondary | ICD-10-CM

## 2017-09-05 DIAGNOSIS — Z79899 Other long term (current) drug therapy: Secondary | ICD-10-CM

## 2017-09-05 DIAGNOSIS — G894 Chronic pain syndrome: Secondary | ICD-10-CM

## 2017-09-05 DIAGNOSIS — Z7951 Long term (current) use of inhaled steroids: Secondary | ICD-10-CM | POA: Diagnosis not present

## 2017-09-05 DIAGNOSIS — W19XXXA Unspecified fall, initial encounter: Secondary | ICD-10-CM | POA: Diagnosis present

## 2017-09-05 DIAGNOSIS — J45909 Unspecified asthma, uncomplicated: Secondary | ICD-10-CM | POA: Diagnosis present

## 2017-09-05 DIAGNOSIS — M4654 Other infective spondylopathies, thoracic region: Secondary | ICD-10-CM | POA: Diagnosis present

## 2017-09-05 DIAGNOSIS — G8929 Other chronic pain: Secondary | ICD-10-CM | POA: Diagnosis not present

## 2017-09-05 DIAGNOSIS — G35 Multiple sclerosis: Secondary | ICD-10-CM | POA: Diagnosis not present

## 2017-09-05 DIAGNOSIS — M868X8 Other osteomyelitis, other site: Secondary | ICD-10-CM | POA: Diagnosis present

## 2017-09-05 DIAGNOSIS — R509 Fever, unspecified: Secondary | ICD-10-CM | POA: Diagnosis present

## 2017-09-05 DIAGNOSIS — M4696 Unspecified inflammatory spondylopathy, lumbar region: Secondary | ICD-10-CM | POA: Diagnosis present

## 2017-09-05 DIAGNOSIS — B379 Candidiasis, unspecified: Secondary | ICD-10-CM | POA: Diagnosis present

## 2017-09-05 DIAGNOSIS — E039 Hypothyroidism, unspecified: Secondary | ICD-10-CM | POA: Diagnosis not present

## 2017-09-05 DIAGNOSIS — G061 Intraspinal abscess and granuloma: Secondary | ICD-10-CM | POA: Diagnosis not present

## 2017-09-05 DIAGNOSIS — M545 Low back pain: Secondary | ICD-10-CM | POA: Diagnosis not present

## 2017-09-05 DIAGNOSIS — X58XXXD Exposure to other specified factors, subsequent encounter: Secondary | ICD-10-CM | POA: Diagnosis not present

## 2017-09-05 LAB — COMPREHENSIVE METABOLIC PANEL
ALT: 26 U/L (ref 14–54)
AST: 35 U/L (ref 15–41)
Albumin: 3.4 g/dL — ABNORMAL LOW (ref 3.5–5.0)
Alkaline Phosphatase: 81 U/L (ref 38–126)
Anion gap: 8 (ref 5–15)
BILIRUBIN TOTAL: 0.4 mg/dL (ref 0.3–1.2)
BUN: 14 mg/dL (ref 6–20)
CO2: 28 mmol/L (ref 22–32)
CREATININE: 0.83 mg/dL (ref 0.44–1.00)
Calcium: 10.2 mg/dL (ref 8.9–10.3)
Chloride: 106 mmol/L (ref 101–111)
GFR calc Af Amer: >60 mL/min (ref >60)
Glucose, Bld: 107 mg/dL — ABNORMAL HIGH (ref 65–99)
Potassium: 5.4 mmol/L — ABNORMAL HIGH (ref 3.5–5.1)
Sodium: 142 mmol/L (ref 135–145)
TOTAL PROTEIN: 7.7 g/dL (ref 6.5–8.1)

## 2017-09-05 LAB — I-STAT CG4 LACTIC ACID, ED: Lactic Acid, Venous: 1.23 mmol/L (ref 0.5–1.9)

## 2017-09-05 LAB — URINALYSIS, ROUTINE W REFLEX MICROSCOPIC
Bilirubin Urine: NEGATIVE
GLUCOSE, UA: NEGATIVE mg/dL
HGB URINE DIPSTICK: NEGATIVE
Ketones, ur: NEGATIVE mg/dL
NITRITE: NEGATIVE
Protein, ur: NEGATIVE mg/dL
SPECIFIC GRAVITY, URINE: 1.019 (ref 1.005–1.030)
pH: 6 (ref 5.0–8.0)

## 2017-09-05 LAB — CBC WITH DIFFERENTIAL/PLATELET
BASOS ABS: 0.1 10*3/uL (ref 0.0–0.1)
Basophils Relative: 1 %
EOS ABS: 0.3 10*3/uL (ref 0.0–0.7)
EOS PCT: 3 %
HCT: 41.4 % (ref 36.0–46.0)
Hemoglobin: 13.7 g/dL (ref 12.0–15.0)
Lymphocytes Relative: 16 %
Lymphs Abs: 1.5 10*3/uL (ref 0.7–4.0)
MCH: 33.3 pg (ref 26.0–34.0)
MCHC: 33.1 g/dL (ref 30.0–36.0)
MCV: 100.7 fL — ABNORMAL HIGH (ref 78.0–100.0)
Monocytes Absolute: 0.6 10*3/uL (ref 0.1–1.0)
Monocytes Relative: 6 %
Neutro Abs: 7.3 10*3/uL (ref 1.7–7.7)
Neutrophils Relative %: 74 %
PLATELETS: 340 10*3/uL (ref 150–400)
RBC: 4.11 MIL/uL (ref 3.87–5.11)
RDW: 12.6 % (ref 11.5–15.5)
WBC: 9.8 10*3/uL (ref 4.0–10.5)

## 2017-09-05 LAB — I-STAT BETA HCG BLOOD, ED (MC, WL, AP ONLY)

## 2017-09-05 MED ORDER — PANTOPRAZOLE SODIUM 40 MG PO TBEC
40.0000 mg | DELAYED_RELEASE_TABLET | Freq: Two times a day (BID) | ORAL | Status: DC
  Administered 2017-09-06 – 2017-09-12 (×13): 40 mg via ORAL
  Filled 2017-09-05 (×13): qty 1

## 2017-09-05 MED ORDER — BREXPIPRAZOLE 2 MG PO TABS
2.0000 mg | ORAL_TABLET | Freq: Every day | ORAL | Status: DC
  Administered 2017-09-06 – 2017-09-12 (×7): 2 mg via ORAL
  Filled 2017-09-05 (×8): qty 1

## 2017-09-05 MED ORDER — SODIUM CHLORIDE 0.9% FLUSH: 3.0000 mL | INTRAVENOUS | Status: DC | PRN

## 2017-09-05 MED ORDER — HYDROMORPHONE HCL 2 MG/ML IJ SOLN
0.5000 mg | Freq: Once | INTRAMUSCULAR | Status: AC
Start: 2017-09-05 — End: 2017-09-05
  Administered 2017-09-05: 0.5 mg via INTRAVENOUS
  Filled 2017-09-05: qty 1

## 2017-09-05 MED ORDER — ADULT MULTIVITAMIN W/MINERALS CH
1.0000 | ORAL_TABLET | Freq: Every day | ORAL | Status: DC
  Administered 2017-09-06 – 2017-09-12 (×7): 1 via ORAL
  Filled 2017-09-05 (×7): qty 1

## 2017-09-05 MED ORDER — OXYCODONE HCL 5 MG PO TABS
5.0000 mg | ORAL_TABLET | ORAL | Status: DC | PRN
  Administered 2017-09-06 (×3): 10 mg via ORAL
  Administered 2017-09-07 – 2017-09-09 (×8): 5 mg via ORAL
  Administered 2017-09-10: 10 mg via ORAL
  Administered 2017-09-10: 5 mg via ORAL
  Administered 2017-09-10: 10 mg via ORAL
  Administered 2017-09-11: 5 mg via ORAL
  Administered 2017-09-11 (×2): 10 mg via ORAL
  Administered 2017-09-12: 5 mg via ORAL
  Filled 2017-09-05 (×5): qty 2
  Filled 2017-09-05 (×8): qty 1
  Filled 2017-09-05 (×2): qty 2
  Filled 2017-09-05 (×2): qty 1
  Filled 2017-09-05: qty 2

## 2017-09-05 MED ORDER — ONDANSETRON HCL 4 MG/2ML IJ SOLN: 4.0000 mg | Freq: Four times a day (QID) | INTRAMUSCULAR | Status: DC | PRN

## 2017-09-05 MED ORDER — BACLOFEN 10 MG PO TABS
10.0000 mg | ORAL_TABLET | Freq: Three times a day (TID) | ORAL | Status: DC
  Administered 2017-09-06 – 2017-09-12 (×21): 10 mg via ORAL
  Filled 2017-09-05 (×22): qty 1

## 2017-09-05 MED ORDER — TIZANIDINE HCL 2 MG PO TABS
6.0000 mg | ORAL_TABLET | Freq: Three times a day (TID) | ORAL | Status: DC
  Administered 2017-09-06 – 2017-09-12 (×21): 6 mg via ORAL
  Filled 2017-09-05 (×22): qty 3

## 2017-09-05 MED ORDER — ALBUTEROL SULFATE (2.5 MG/3ML) 0.083% IN NEBU: 2.5000 mg | INHALATION_SOLUTION | Freq: Four times a day (QID) | RESPIRATORY_TRACT | Status: DC | PRN

## 2017-09-05 MED ORDER — SODIUM CHLORIDE 0.9 % IV SOLN: 250.0000 mL | INTRAVENOUS | Status: DC | PRN

## 2017-09-05 MED ORDER — DULOXETINE HCL 60 MG PO CPEP
60.0000 mg | ORAL_CAPSULE | Freq: Every day | ORAL | Status: DC
  Administered 2017-09-06 – 2017-09-12 (×7): 60 mg via ORAL
  Filled 2017-09-05 (×7): qty 1

## 2017-09-05 MED ORDER — ACETAMINOPHEN 650 MG RE SUPP: 650.0000 mg | Freq: Four times a day (QID) | RECTAL | Status: DC | PRN

## 2017-09-05 MED ORDER — PREGABALIN 75 MG PO CAPS
150.0000 mg | ORAL_CAPSULE | Freq: Three times a day (TID) | ORAL | Status: DC
  Administered 2017-09-06 – 2017-09-12 (×20): 150 mg via ORAL
  Filled 2017-09-05 (×14): qty 2
  Filled 2017-09-05: qty 6
  Filled 2017-09-05 (×5): qty 2

## 2017-09-05 MED ORDER — OXYCODONE-ACETAMINOPHEN 5-325 MG PO TABS
1.0000 | ORAL_TABLET | ORAL | Status: DC | PRN
  Administered 2017-09-07 – 2017-09-09 (×4): 1 via ORAL
  Administered 2017-09-09 – 2017-09-11 (×3): 2 via ORAL
  Filled 2017-09-05 (×2): qty 1
  Filled 2017-09-05 (×2): qty 2
  Filled 2017-09-05 (×2): qty 1
  Filled 2017-09-05: qty 2

## 2017-09-05 MED ORDER — BISACODYL 10 MG RE SUPP
10.0000 mg | Freq: Every day | RECTAL | Status: DC | PRN
  Administered 2017-09-10: 10 mg via RECTAL
  Filled 2017-09-05: qty 1

## 2017-09-05 MED ORDER — LORAZEPAM 2 MG/ML IJ SOLN
1.0000 mg | Freq: Once | INTRAMUSCULAR | Status: AC
  Administered 2017-09-05: 1 mg via INTRAVENOUS

## 2017-09-05 MED ORDER — SUVOREXANT 20 MG PO TABS
20.0000 mg | ORAL_TABLET | Freq: Every day | ORAL | Status: DC
  Administered 2017-09-12: 20 mg via ORAL

## 2017-09-05 MED ORDER — BUSPIRONE HCL 5 MG PO TABS
10.0000 mg | ORAL_TABLET | Freq: Three times a day (TID) | ORAL | Status: DC
  Administered 2017-09-06 – 2017-09-12 (×20): 10 mg via ORAL
  Filled 2017-09-05 (×5): qty 2
  Filled 2017-09-05 (×2): qty 1
  Filled 2017-09-05 (×11): qty 2
  Filled 2017-09-05: qty 1
  Filled 2017-09-05 (×2): qty 2
  Filled 2017-09-05: qty 1
  Filled 2017-09-05: qty 2

## 2017-09-05 MED ORDER — LEVOTHYROXINE SODIUM 100 MCG PO TABS
100.0000 ug | ORAL_TABLET | Freq: Every day | ORAL | Status: DC
  Administered 2017-09-06 – 2017-09-12 (×7): 100 ug via ORAL
  Filled 2017-09-05 (×8): qty 1

## 2017-09-05 MED ORDER — FUROSEMIDE 20 MG PO TABS: 20.0000 mg | ORAL_TABLET | Freq: Every day | ORAL | Status: DC

## 2017-09-05 MED ORDER — OLOPATADINE HCL 0.1 % OP SOLN
1.0000 [drp] | Freq: Two times a day (BID) | OPHTHALMIC | Status: DC
  Administered 2017-09-06 – 2017-09-12 (×13): 1 [drp] via OPHTHALMIC
  Filled 2017-09-05: qty 5

## 2017-09-05 MED ORDER — FLEET ENEMA 7-19 GM/118ML RE ENEM
1.0000 | ENEMA | Freq: Once | RECTAL | Status: AC | PRN
  Administered 2017-09-10: 1 via RECTAL
  Filled 2017-09-05: qty 1

## 2017-09-05 MED ORDER — SENNOSIDES-DOCUSATE SODIUM 8.6-50 MG PO TABS
1.0000 | ORAL_TABLET | Freq: Every evening | ORAL | Status: DC | PRN
  Administered 2017-09-08: 1 via ORAL
  Filled 2017-09-05: qty 1

## 2017-09-05 MED ORDER — SODIUM CHLORIDE 0.9% FLUSH
3.0000 mL | Freq: Two times a day (BID) | INTRAVENOUS | Status: DC
  Administered 2017-09-06 – 2017-09-12 (×7): 3 mL via INTRAVENOUS

## 2017-09-05 MED ORDER — LEVETIRACETAM 750 MG PO TABS
1500.0000 mg | ORAL_TABLET | Freq: Two times a day (BID) | ORAL | Status: DC
  Administered 2017-09-06 – 2017-09-12 (×13): 1500 mg via ORAL
  Filled 2017-09-05 (×6): qty 2
  Filled 2017-09-05: qty 3
  Filled 2017-09-05 (×6): qty 2

## 2017-09-05 MED ORDER — OXYCODONE-ACETAMINOPHEN 10-325 MG PO TABS: 1.0000 | ORAL_TABLET | ORAL | Status: DC | PRN

## 2017-09-05 MED ORDER — LIOTHYRONINE SODIUM 5 MCG PO TABS
5.0000 ug | ORAL_TABLET | Freq: Every day | ORAL | Status: DC
  Administered 2017-09-06 – 2017-09-12 (×7): 5 ug via ORAL
  Filled 2017-09-05 (×9): qty 1

## 2017-09-05 MED ORDER — LORAZEPAM 2 MG/ML IJ SOLN
INTRAMUSCULAR | Status: AC
  Filled 2017-09-05: qty 1

## 2017-09-05 MED ORDER — DULOXETINE HCL 30 MG PO CPEP
30.0000 mg | ORAL_CAPSULE | Freq: Every day | ORAL | Status: DC
  Administered 2017-09-06 – 2017-09-11 (×7): 30 mg via ORAL
  Filled 2017-09-05 (×8): qty 1

## 2017-09-05 MED ORDER — ACETAMINOPHEN 325 MG PO TABS
650.0000 mg | ORAL_TABLET | Freq: Four times a day (QID) | ORAL | Status: DC | PRN
  Administered 2017-09-07: 650 mg via ORAL
  Filled 2017-09-05: qty 2

## 2017-09-05 MED ORDER — MAGNESIUM OXIDE 400 (241.3 MG) MG PO TABS
400.0000 mg | ORAL_TABLET | Freq: Every day | ORAL | Status: DC
  Administered 2017-09-06 – 2017-09-12 (×7): 400 mg via ORAL
  Filled 2017-09-05 (×7): qty 1

## 2017-09-05 MED ORDER — ONDANSETRON HCL 4 MG PO TABS
4.0000 mg | ORAL_TABLET | Freq: Four times a day (QID) | ORAL | Status: DC | PRN
  Administered 2017-09-06: 4 mg via ORAL
  Filled 2017-09-05: qty 1

## 2017-09-05 NOTE — ED Notes (Signed)
Delay in lab draw,  Pt currently in mri.

## 2017-09-05 NOTE — ED Notes (Signed)
Patient transported to MRI 

## 2017-09-05 NOTE — ED Triage Notes (Signed)
Pt presents from PCP office for evaluation of fever and back pain. States was d/c from [facility] on 4/19. States has perispinal fluid accumulation and fractured rib. Pain to back is not worse but not improving.

## 2017-09-05 NOTE — H&P (Signed)
History and Physical    [name] ZOX:[id number] DOB: 06/17/1970 DOA: 09/05/2017  PCP: Primus Bravo, NP   Patient coming from: Home  Chief Complaint: Fevers, mid-back pain   HPI: [name] is a 47 y.o. female with medical history significant for multiple sclerosis, depression with anxiety, hypothyroidism, and chronic pain, now presenting to the emergency department for evaluation of fevers at home and increased pain in her mid back.  Patient reports increased back pain since 08/20/2017, was seen at [facility] for these complaints and admitted to the hospital, underwent MRI that revealed a left paraspinal fluid collection with surrounding inflammatory changes concerning for possible abscess.  She was given antibiotics while in the hospital, IR was consulted but did not feel that it needed to be drained at that time, and the patient left the hospital AMA on 08/28/2017.  She has not taken antibiotics since leaving the hospital.  She reports continued fevers at home and continued pain.  She has had constipation, but no saddle anesthesia, lower extremity weakness, or incontinence.  Denies IV drug use.  ED Course: Upon arrival to the ED, patient is found to be afebrile, saturating well on room air, and with vitals otherwise normal.  Chemistry panel is notable for a potassium of 5.4 and CBC features a mild macrocytosis without anemia.  Lactic acid is normal.  Neurosurgery was consulted by the ED physician, reviewed the MRI from 08/28/2017, noted that there was no epidural involvement requiring neurosurgery.  Infectious disease was consulted by the ED physician and recommended MRI, IR drainage of any fluid collection, and hold antibiotics for now.  Patient was treated with 0.5 mg IV Dilaudid x2 in the ED.  She remains hemodynamically stable, in no apparent respiratory distress, and will be admitted to the medical-surgical unit for ongoing evaluation and management paraspinal fluid  collection, possibly abscess.  Review of Systems:  All other systems reviewed and apart from HPI, are negative.  Past Medical History:  Diagnosis Date  . Anxiety and depression   . Arthritis   . Asthma   . Chronic pain    back,legs,neck  . Complication of anesthesia    cries  . Depression   . Fibromyalgia   . Gastroenteritis, acute    w/ dehydration  . Hypothyroidism   . Migraine headache   . Multiple allergies   . Multiple sclerosis (HCC)   . Neuromuscular disorder (HCC)    neuropathy pain  . Ovarian mass November 11 2012   left   . Rheumatic disease     Past Surgical History:  Procedure Laterality Date  . BUNIONECTOMY     both feet  . CARPAL TUNNEL RELEASE Left 10/28/2012   Procedure: CARPAL TUNNEL RELEASE;  Surgeon: Nicki Reaper, MD;  Location: [facility];  Service: Orthopedics;  Laterality: Left;  . CHOLECYSTECTOMY  09/2014   in [place], portion of liver removed- cyst  . COLONOSCOPY W/ POLYPECTOMY  2012  . DILATION AND CURETTAGE OF UTERUS    . ELBOW ARTHROSCOPY     rt  . ELBOW SURGERY Right may 2014   tendon rair  . FOOT NEUROMA SURGERY     both feet  . HERNIA REPAIR  05/2015   umbilical  . MYOMECTOMY ABDOMINAL APPROACH    . OPEN REDUCTION INTERNAL FIXATION (ORIF) DISTAL RADIAL FRACTURE Left 10/28/2012   Procedure: OPEN REDUCTION INTERNAL FIXATION (ORIF) DISTAL RADIAL FRACTURE;  Surgeon: Nicki Reaper, MD;  Location: [facility];  Service: Orthopedics;  Laterality: Left;  . OVARY SURGERY     rt 1987  . ROTATOR CUFF REPAIR  2000   rt  . TONSILLECTOMY    . WRIST SURGERY Right 2006     reports that she quit smoking about 2 years ago. Her smoking use included cigarettes. She smoked 1.00 pack per day. She has never used smokeless tobacco. She reports that she does not drink alcohol or use drugs.  Allergies  Allergen Reactions  . Levaquin [Levofloxacin In D5w] Shortness Of Breath  . Bactrim [Sulfamethoxazole-Trimethoprim] Other (See  Comments)    "really red all over, hot skinned"  . Doxycycline Hives  . Other     Seasonal allergies  . Latex Rash    Family History  Problem Relation Age of Onset  . Rheum arthritis Mother   . Lupus Mother   . Sjogren's syndrome Mother   . Lupus Father   . Cancer - Other Father   . Cancer - Other Brother        colon in 1 brother     Prior to Admission medications   Medication Sig Start Date End Date Taking? Authorizing Provider  albuterol (PROVENTIL) (2.5 MG/3ML) 0.083% nebulizer solution As needed 05/14/15  Yes [provider]  amphetamine-dextroamphetamine (ADDERALL) 30 MG tablet Take 30 mg by mouth 2 (two) times daily.  09/15/12  Yes [provider]  baclofen (LIORESAL) 10 MG tablet Take 10 mg by mouth 3 (three) times daily.   Yes [provider]  Biotin 5000 MCG CAPS Take 1 capsule by mouth daily.   Yes [provider]  Brexpiprazole (REXULTI) 2 MG TABS Take 2 mg by mouth daily.  07/13/16  Yes [provider]  busPIRone (BUSPAR) 10 MG tablet 10 mg 3 (three) times daily. 07/13/16  Yes [provider]  celecoxib (CELEBREX) 100 MG capsule 100 mg 2 (two) times daily. 07/16/16  Yes [provider]  cetirizine (ZYRTEC) 10 MG tablet Take 10 mg by mouth daily.   Yes [provider]  diphenoxylate-atropine (LOMOTIL) 2.5-0.025 MG tablet 2.5-0.025,mg as needed 03/15/15  Yes [provider]  DULoxetine (CYMBALTA) 30 MG capsule Take 30 mg by mouth at bedtime. 05/17/16  Yes [provider]  DULoxetine (CYMBALTA) 60 MG capsule Take 60 mg by mouth daily.  09/02/12  Yes [provider]  fluticasone (FLONASE) 50 MCG/ACT nasal spray Place 2 sprays into both nostrils daily. 50 mcg 04/25/15  Yes [provider]  furosemide (LASIX) 40 MG tablet Take 40 mg by mouth daily.  04/24/15  Yes [provider]  Ketorolac Tromethamine (SPRIX) 15.75 MG/SPRAY SOLN Place 1 spray into the nose every 6 (six)  hours as needed.  05/13/17  Yes [provider]  levETIRAcetam (KEPPRA) 750 MG tablet 1,500 mg 2 (two) times daily.  09/08/12  Yes [provider]  levothyroxine (SYNTHROID) 100 MCG tablet Take 100 mcg by mouth daily. Can not take generic ( MUST USE BRAND ONLY) 06/19/17  Yes [provider]  liothyronine (CYTOMEL) 5 MCG tablet Take 5 mcg by mouth daily.  05/22/16  Yes [provider]  magnesium oxide (MAG-OX) 400 MG tablet Take 400 mg by mouth daily.   Yes [provider]  Multiple Vitamins-Minerals (MULTIVITAMIN WITH MINERALS) tablet Take 1 tablet by mouth daily.   Yes [provider]  ocrelizumab (OCREVUS) 300 MG/10ML injection 300 mg IV once every 6 months 02/06/16  Yes [provider]  olopatadine (PATANOL) 0.1 % ophthalmic solution PLACE ONE DROP  INTO BOTH EYES 2 (TWO) TIMES DAILY. 06/20/17  Yes [provider]  ondansetron (ZOFRAN) 8 MG tablet Take 8 mg by mouth as needed. 07/25/17  Yes [provider]  oxybutynin (DITROPAN-XL) 5 MG 24 hr tablet Take 1 tablet (5 mg total) by mouth at bedtime. 06/25/17  Yes Penumalli, Glenford Bayley, MD  oxyCODONE-acetaminophen (PERCOCET) 10-325 MG per tablet Take 1 tablet by mouth every 4 (four) hours as needed for pain. 10/28/12  Yes Cindee Salt, MD  pregabalin (LYRICA) 150 MG capsule Take 150 mg by mouth 3 (three) times daily. 07/23/16 09/05/17 Yes [provider]  promethazine (PHENERGAN) 25 MG tablet Take 25 mg by mouth every 4 (four) hours as needed.  09/01/12  Yes [provider]  RABEprazole (ACIPHEX) 20 MG tablet Take 20 mg by mouth 2 (two) times daily before a meal.  09/10/12  Yes [provider]  Suvorexant (BELSOMRA) 20 MG TABS Take 20 mg by mouth daily.  03/28/15  Yes [provider]  tizanidine (ZANAFLEX) 6 MG capsule Take 6 mg by mouth 3 (three) times daily.   Yes [provider]  valACYclovir (VALTREX) 1000 MG tablet TAKE 2 TABS AT ONSET AND 2  TABS 12 HOURS LATER X1DAY FOR EACH FLARE 06/09/17  Yes [provider]  colestipol (COLESTID) 1 g tablet Take 1 g by mouth daily.  06/25/17   [provider]  glycopyrrolate (ROBINUL) 2 MG tablet Take 2 mg by mouth daily. 04/15/17   [provider]    Physical Exam: Vitals:   09/05/17 1628 09/05/17 1826 09/05/17 2047 09/05/17 2134  BP: 122/75 114/77 116/74 124/77  Pulse: 82 76 80 84  Resp: 16 14 18 18   Temp:  98.5 F (36.9 C)    TempSrc:  Oral    SpO2: 97% 97% 94% 98%  Weight:      Height:          Constitutional: NAD, calm  Eyes: PERTLA, lids and conjunctivae normal ENMT: Mucous membranes are moist. Posterior pharynx clear of any exudate or lesions.   Neck: normal, supple, no masses, no thyromegaly Respiratory: clear to auscultation bilaterally, no wheezing, no crackles. Normal respiratory effort.   Cardiovascular: S1 & S2 heard, regular rate and rhythm. No significant JVD. Abdomen: No distension, no tenderness, soft. Bowel sounds normal.  Musculoskeletal: no clubbing / cyanosis. No joint deformity upper and lower extremities.    Skin: no significant rashes, lesions, ulcers. Warm, dry, well-perfused. Neurologic: No facial asymmetry. Sensation to light touch intact. Strength 5/5 in all 4 limbs.  Psychiatric: Alert and oriented x 3. Calm, cooperative.     Labs on Admission: I have personally reviewed following labs and imaging studies  CBC: Recent Labs  Lab 09/05/17 1247  WBC 9.8  NEUTROABS 7.3  HGB 13.7  HCT 41.4  MCV 100.7*  PLT 340   Basic Metabolic Panel: Recent Labs  Lab 09/05/17 1247  NA 142  K 5.4*  CL 106  CO2 28  GLUCOSE 107*  BUN 14  CREATININE 0.83  CALCIUM 10.2   GFR: Estimated Creatinine Clearance: 80.6 mL/min (by C-G formula based on SCr of 0.83 mg/dL). Liver Function Tests: Recent Labs  Lab 09/05/17 1247  AST 35  ALT 26  ALKPHOS 81  BILITOT 0.4  PROT 7.7  ALBUMIN 3.4*   No results for input(s): LIPASE,  AMYLASE in the last 168 hours. No results for input(s): AMMONIA in the last 168 hours. Coagulation Profile: No results for input(s): INR, PROTIME in  the last 168 hours. Cardiac Enzymes: No results for input(s): CKTOTAL, CKMB, CKMBINDEX, TROPONINI in the last 168 hours. BNP (last 3 results) No results for input(s): PROBNP in the last 8760 hours. HbA1C: No results for input(s): HGBA1C in the last 72 hours. CBG: No results for input(s): GLUCAP in the last 168 hours. Lipid Profile: No results for input(s): CHOL, HDL, LDLCALC, TRIG, CHOLHDL, LDLDIRECT in the last 72 hours. Thyroid Function Tests: No results for input(s): TSH, T4TOTAL, FREET4, T3FREE, THYROIDAB in the last 72 hours. Anemia Panel: No results for input(s): VITAMINB12, FOLATE, FERRITIN, TIBC, IRON, RETICCTPCT in the last 72 hours. Urine analysis:    Component Value Date/Time   COLORURINE YELLOW 09/05/2017 1312   APPEARANCEUR HAZY (A) 09/05/2017 1312   APPEARANCEUR Clear 01/07/2013 1102   LABSPEC 1.019 09/05/2017 1312   PHURINE 6.0 09/05/2017 1312   GLUCOSEU NEGATIVE 09/05/2017 1312   HGBUR NEGATIVE 09/05/2017 1312   BILIRUBINUR NEGATIVE 09/05/2017 1312   BILIRUBINUR Negative 01/07/2013 1102   KETONESUR NEGATIVE 09/05/2017 1312   PROTEINUR NEGATIVE 09/05/2017 1312   UROBILINOGEN 0.2 01/13/2007 1445   NITRITE NEGATIVE 09/05/2017 1312   LEUKOCYTESUR TRACE (A) 09/05/2017 1312   LEUKOCYTESUR Negative 01/07/2013 1102   Sepsis Labs: @LABRCNTIP (procalcitonin:4,lacticidven:4) )No results found for this or any previous visit (from the past 240 hour(s)).   Radiological Exams on Admission: No results found.  EKG: Ordered and pending.   Assessment/Plan   1. Paraspinal fluid-collection  - Presents with subjective fevers at home, increase in her chronic back pain  - Admitted to [facility] for these complaints, MRI on 4/17 demonstrated paraspinal fluid-collection, she received abx in hospital, but left AMA on  4/19 without abx  - She reports fevers at home and increased pain; afebrile here without leukocytosis  - NSG consulted by EDP, reviewed 4/17 MRI and noted that collection did not involve epidural space  - ID consulted by EDP and recommended repeat MRI, IR aspiration, no abx now  - Culture blood, follow-up MRI findings    2. Chronic pain  - Complains of increased mid-back pain, likely related to fluid-collection  - Increase her Percocet to 1-2 tabs q4h prn and continue Cymbalta, muscle relaxants  - Continue bowel-regimen   3. Multiple sclerosis  - Stable  - Follows with neuro, managed with Ocrevus    4. Hypothyroidism  - Continue Synthroid    DVT prophylaxis: SCD's  Code Status: Full  Family Communication: Discussed with patient Consults called: Infectious disease Admission status: Inpatient    Briscoe Deutscher, MD [facility] Pager [phone]  If 7PM-7AM, please contact night-coverage [email or web address] Password [facility]  09/05/2017, 10:34 PM

## 2017-09-05 NOTE — ED Provider Notes (Signed)
[facility] EMERGENCY DEPARTMENT Provider Note   CSN: [id number] Arrival date & time: 09/05/17  1208     History   Chief Complaint Chief Complaint  Patient presents with  . Fever  . Back Pain    HPI [name] is a 47 y.o. female.  HPI Pt was recently at [facility] for a paraspinal abscess.  Pt initially presented with fever and back pain.  She had a workup that revealed a fluid collection in the paraspinal region  Measuring 3.6 by 0.7 by 10.4 cm.    Interventional radiology was consulted they did not recommend drainage.  PT was given a dose of abx but they were not continued.  Pt left the hospital at her request on the 19th.  Pt followed up with her PCP who felt that she should come to [facility] because that is where her neurologist is based and she needed to be continued on abx  Pt has been having persistent fevers throughout the week.  Up to 101.  She continues to have pain in her back.  She is on chronic pain meds and sees pain management fro chronic back pain issues.  Pt is not having numbness or weakness.  Pt did get a bilateral SI joint injfection on 3/29  Past Medical History:  Diagnosis Date  . Anxiety and depression   . Arthritis   . Asthma   . Chronic pain    back,legs,neck  . Complication of anesthesia    cries  . Depression   . Fibromyalgia   . Gastroenteritis, acute    w/ dehydration  . Hypothyroidism   . Migraine headache   . Multiple allergies   . Multiple sclerosis (HCC)   . Neuromuscular disorder (HCC)    neuropathy pain  . Ovarian mass November 11 2012   left   . Rheumatic disease     Patient Active Problem List   Diagnosis Date Noted  . Anxiety state, unspecified 01/07/2013  . Multiple sclerosis (HCC) 09/17/2012    Past Surgical History:  Procedure Laterality Date  . BUNIONECTOMY     both feet  . CARPAL TUNNEL RELEASE Left 10/28/2012   Procedure: CARPAL TUNNEL RELEASE;  Surgeon: Nicki Reaper, MD;   Location: [facility];  Service: Orthopedics;  Laterality: Left;  . CHOLECYSTECTOMY  09/2014   in [place], portion of liver removed- cyst  . COLONOSCOPY W/ POLYPECTOMY  2012  . DILATION AND CURETTAGE OF UTERUS    . ELBOW ARTHROSCOPY     rt  . ELBOW SURGERY Right may 2014   tendon rair  . FOOT NEUROMA SURGERY     both feet  . HERNIA REPAIR  05/2015   umbilical  . MYOMECTOMY ABDOMINAL APPROACH    . OPEN REDUCTION INTERNAL FIXATION (ORIF) DISTAL RADIAL FRACTURE Left 10/28/2012   Procedure: OPEN REDUCTION INTERNAL FIXATION (ORIF) DISTAL RADIAL FRACTURE;  Surgeon: Nicki Reaper, MD;  Location:  SURGERY CENTER;  Service: Orthopedics;  Laterality: Left;  . OVARY SURGERY     rt 1987  . ROTATOR CUFF REPAIR  2000   rt  . TONSILLECTOMY    . WRIST SURGERY Right 2006     OB History   None      Home Medications    Prior to Admission medications   Medication Sig Start Date End Date Taking? Authorizing Provider  albuterol (PROVENTIL) (2.5 MG/3ML) 0.083% nebulizer solution As needed 05/14/15  Yes [provider]  amphetamine-dextroamphetamine (ADDERALL)  30 MG tablet Take 30 mg by mouth 2 (two) times daily.  09/15/12  Yes [provider]  baclofen (LIORESAL) 10 MG tablet Take 10 mg by mouth 3 (three) times daily.   Yes [provider]  Biotin 5000 MCG CAPS Take 1 capsule by mouth daily.   Yes [provider]  Brexpiprazole (REXULTI) 2 MG TABS Take 2 mg by mouth daily.  07/13/16  Yes [provider]  busPIRone (BUSPAR) 10 MG tablet 10 mg 3 (three) times daily. 07/13/16  Yes [provider]  celecoxib (CELEBREX) 100 MG capsule 100 mg 2 (two) times daily. 07/16/16  Yes [provider]  cetirizine (ZYRTEC) 10 MG tablet Take 10 mg by mouth daily.   Yes [provider]  diphenoxylate-atropine (LOMOTIL) 2.5-0.025 MG tablet 2.5-0.025,mg as needed 03/15/15  Yes [provider]  DULoxetine (CYMBALTA) 30 MG capsule  Take 30 mg by mouth at bedtime. 05/17/16  Yes [provider]  DULoxetine (CYMBALTA) 60 MG capsule Take 60 mg by mouth daily.  09/02/12  Yes [provider]  fluticasone (FLONASE) 50 MCG/ACT nasal spray Place 2 sprays into both nostrils daily. 50 mcg 04/25/15  Yes [provider]  furosemide (LASIX) 40 MG tablet Take 40 mg by mouth daily.  04/24/15  Yes [provider]  Ketorolac Tromethamine (SPRIX) 15.75 MG/SPRAY SOLN Place 1 spray into the nose every 6 (six) hours as needed.  05/13/17  Yes [provider]  levETIRAcetam (KEPPRA) 750 MG tablet 1,500 mg 2 (two) times daily.  09/08/12  Yes [provider]  levothyroxine (SYNTHROID) 100 MCG tablet Take 100 mcg by mouth daily. Can not take generic ( MUST USE BRAND ONLY) 06/19/17  Yes [provider]  liothyronine (CYTOMEL) 5 MCG tablet Take 5 mcg by mouth daily.  05/22/16  Yes [provider]  magnesium oxide (MAG-OX) 400 MG tablet Take 400 mg by mouth daily.   Yes [provider]  Multiple Vitamins-Minerals (MULTIVITAMIN WITH MINERALS) tablet Take 1 tablet by mouth daily.   Yes [provider]  ocrelizumab (OCREVUS) 300 MG/10ML injection 300 mg IV once every 6 months 02/06/16  Yes [provider]  olopatadine (PATANOL) 0.1 % ophthalmic solution PLACE ONE DROP INTO BOTH EYES 2 (TWO) TIMES DAILY. 06/20/17  Yes [provider]  ondansetron (ZOFRAN) 8 MG tablet Take 8 mg by mouth as needed. 07/25/17  Yes [provider]  oxybutynin (DITROPAN-XL) 5 MG 24 hr tablet Take 1 tablet (5 mg total) by mouth at bedtime. 06/25/17  Yes Penumalli, Glenford Bayley, MD  oxyCODONE-acetaminophen (PERCOCET) 10-325 MG per tablet Take 1 tablet by mouth every 4 (four) hours as needed for pain. 10/28/12  Yes Cindee Salt, MD  pregabalin (LYRICA) 150 MG capsule Take 150 mg by mouth 3 (three) times daily. 07/23/16 09/05/17 Yes [provider]  promethazine (PHENERGAN) 25 MG  tablet Take 25 mg by mouth every 4 (four) hours as needed.  09/01/12  Yes [provider]  RABEprazole (ACIPHEX) 20 MG tablet Take 20 mg by mouth 2 (two) times daily before a meal.  09/10/12  Yes [provider]  Suvorexant (BELSOMRA) 20 MG TABS Take 20 mg by mouth daily.  03/28/15  Yes [provider]  tizanidine (ZANAFLEX) 6 MG capsule Take 6 mg by mouth 3 (three) times daily.   Yes [provider]  valACYclovir (VALTREX) 1000 MG tablet TAKE 2 TABS AT ONSET AND 2 TABS 12 HOURS LATER X1DAY FOR [facility] FLARE 06/09/17  Yes [provider]  colestipol (COLESTID) 1 g tablet Take 1 g by mouth daily.  06/25/17   [provider]  glycopyrrolate (ROBINUL) 2 MG tablet Take 2 mg by mouth daily. 04/15/17   [provider]    Family History Family History  Problem Relation Age of Onset  . Rheum arthritis Mother   . Lupus Mother   . Sjogren's syndrome Mother   . Lupus Father   . Cancer - Other Father   . Cancer - Other Brother        colon in 1 brother    Social History Social History   Tobacco Use  . Smoking status: Former Smoker    Packs/day: 1.00    Types: Cigarettes    Last attempt to quit: 09/21/2014    Years since quitting: 2.9  . Smokeless tobacco: Never Used  Substance Use Topics  . Alcohol use: No  . Drug use: No     Allergies   Levaquin [levofloxacin in d5w]; Bactrim [sulfamethoxazole-trimethoprim]; Doxycycline; Other; and Latex   Review of Systems Review of Systems  All other systems reviewed and are negative.    Physical Exam Updated Vital Signs BP 116/74 (BP Location: Left Arm)   Pulse 80   Temp 98.5 F (36.9 C) (Oral)   Resp 18   Ht 1.702 m (5\' 7" )   Wt 73.5 kg (162 lb)   SpO2 94%   BMI 25.37 kg/m   Physical Exam  Constitutional: She appears well-developed and well-nourished. No distress.  HENT:  Head: Normocephalic and atraumatic.  Right Ear: External ear normal.  Left Ear: External ear normal.    Eyes: Conjunctivae are normal. Right eye exhibits no discharge. Left eye exhibits no discharge. No scleral icterus.  Neck: Neck supple. No tracheal deviation present.  Cardiovascular: Normal rate, regular rhythm and intact distal pulses.  Pulmonary/Chest: Effort normal and breath sounds normal. No stridor. No respiratory distress. She has no wheezes. She has no rales.  Abdominal: Soft. Bowel sounds are normal. She exhibits no distension. There is no tenderness. There is no rebound and no guarding.  Musculoskeletal: She exhibits no edema.       Cervical back: Normal.       Thoracic back: She exhibits tenderness and bony tenderness. She exhibits no swelling and no deformity.       Lumbar back: She exhibits tenderness and bony tenderness. She exhibits no swelling, no edema and no deformity.  Neurological: She is alert. She has normal strength. No cranial nerve deficit (no facial droop, extraocular movements intact, no slurred speech) or sensory deficit. She exhibits normal muscle tone. She displays no seizure activity. Coordination normal.  Skin: Skin is warm and dry. No rash noted.  Psychiatric: She has a normal mood and affect.  Nursing note and vitals reviewed.    ED Treatments / Results  Labs (all labs ordered are listed, but only abnormal results are displayed) Labs Reviewed  COMPREHENSIVE METABOLIC PANEL - Abnormal; Notable for the following components:      Result Value   Potassium 5.4 (*)    Glucose, Bld 107 (*)    Albumin 3.4 (*)    All other components within normal limits  CBC WITH DIFFERENTIAL/PLATELET - Abnormal; Notable for the following components:   MCV 100.7 (*)    All other components within normal limits  URINALYSIS, ROUTINE W REFLEX MICROSCOPIC - Abnormal; Notable for the following components:   APPearance HAZY (*)    Leukocytes, UA TRACE (*)  Bacteria, UA FEW (*)    All other components within normal limits  I-STAT CG4 LACTIC ACID, ED  I-STAT BETA HCG BLOOD,  ED ([facility], [facility], [facility] ONLY)     Procedures Procedures (including critical care time)  Medications Ordered in ED Medications  HYDROmorphone (DILAUDID) injection 0.5 mg (0.5 mg Intravenous Given 09/05/17 2045)     Initial Impression / Assessment and Plan / ED Course  I have reviewed the triage vital signs and the nursing notes.  Pertinent labs & imaging results that were available during my care of the patient were reviewed by me and considered in my medical decision making (see chart for details).  Clinical Course as of Sep 06 2118  Thu Sep 05, 2017  2059 Reviewed previous MRI results with Dr Newell Coral, neurosurgery.  This does not involve the epidural space.  Neurosurgery does not need to be involved..    [JK]  2115 Discussed with Dr Daiva Eves.  No abx right now.  Would admit the patient to the hospital.  Get blood cultures and see what mri results show   [JK]    Clinical Course User Index [JK] Linwood Dibbles, MD    Patient presents emergency room for evaluation after recently being discharged from the hospital before completing her treatment for possible spinal abscess.  I have been able to review the MRI results from her hospitalization at [facility].  Patient's laboratory tests today are normal.  She does not have a fever.  However we do not know if this fluid collection is an abscess.  Plan on MRI here at [facility] to reevaluate that area.  Patient will be admitted to the hospital.  She will likely need IR drainage of that fluid collection for further characterization.  I will consult the medical service for admission.  Final Clinical Impressions(s) / ED Diagnoses   Final diagnoses:  Paraspinal abscess [facility])      Linwood Dibbles, MD 09/05/17 2121

## 2017-09-06 ENCOUNTER — Inpatient Hospital Stay (HOSPITAL_COMMUNITY): Payer: Medicare Other

## 2017-09-06 ENCOUNTER — Other Ambulatory Visit (HOSPITAL_COMMUNITY): Payer: Medicare Other

## 2017-09-06 DIAGNOSIS — Z881 Allergy status to other antibiotic agents status: Secondary | ICD-10-CM

## 2017-09-06 DIAGNOSIS — J302 Other seasonal allergic rhinitis: Secondary | ICD-10-CM

## 2017-09-06 DIAGNOSIS — Z91040 Latex allergy status: Secondary | ICD-10-CM

## 2017-09-06 DIAGNOSIS — J90 Pleural effusion, not elsewhere classified: Secondary | ICD-10-CM

## 2017-09-06 DIAGNOSIS — F419 Anxiety disorder, unspecified: Secondary | ICD-10-CM

## 2017-09-06 DIAGNOSIS — M545 Low back pain: Secondary | ICD-10-CM

## 2017-09-06 DIAGNOSIS — K219 Gastro-esophageal reflux disease without esophagitis: Secondary | ICD-10-CM

## 2017-09-06 DIAGNOSIS — X58XXXD Exposure to other specified factors, subsequent encounter: Secondary | ICD-10-CM

## 2017-09-06 DIAGNOSIS — M797 Fibromyalgia: Secondary | ICD-10-CM

## 2017-09-06 DIAGNOSIS — S2232XD Fracture of one rib, left side, subsequent encounter for fracture with routine healing: Secondary | ICD-10-CM

## 2017-09-06 DIAGNOSIS — Z882 Allergy status to sulfonamides status: Secondary | ICD-10-CM

## 2017-09-06 DIAGNOSIS — J869 Pyothorax without fistula: Secondary | ICD-10-CM

## 2017-09-06 DIAGNOSIS — Z9181 History of falling: Secondary | ICD-10-CM

## 2017-09-06 DIAGNOSIS — M5136 Other intervertebral disc degeneration, lumbar region: Secondary | ICD-10-CM

## 2017-09-06 DIAGNOSIS — M869 Osteomyelitis, unspecified: Secondary | ICD-10-CM

## 2017-09-06 DIAGNOSIS — Z87891 Personal history of nicotine dependence: Secondary | ICD-10-CM

## 2017-09-06 LAB — BASIC METABOLIC PANEL
Anion gap: 12 (ref 5–15)
BUN: 20 mg/dL (ref 6–20)
CALCIUM: 9.4 mg/dL (ref 8.9–10.3)
CHLORIDE: 101 mmol/L (ref 101–111)
CO2: 24 mmol/L (ref 22–32)
Creatinine, Ser: 0.78 mg/dL (ref 0.44–1.00)
GFR calc non Af Amer: >60 mL/min (ref >60)
Glucose, Bld: 96 mg/dL (ref 65–99)
Potassium: 5 mmol/L (ref 3.5–5.1)
SODIUM: 137 mmol/L (ref 135–145)

## 2017-09-06 LAB — CBC WITH DIFFERENTIAL/PLATELET
BASOS PCT: 1 %
Basophils Absolute: 0.1 10*3/uL (ref 0.0–0.1)
EOS ABS: 0.6 10*3/uL (ref 0.0–0.7)
EOS PCT: 6 %
HCT: 37.5 % (ref 36.0–46.0)
Hemoglobin: 12 g/dL (ref 12.0–15.0)
LYMPHS ABS: 1.9 10*3/uL (ref 0.7–4.0)
Lymphocytes Relative: 20 %
MCH: 32.4 pg (ref 26.0–34.0)
MCHC: 32 g/dL (ref 30.0–36.0)
MCV: 101.4 fL — ABNORMAL HIGH (ref 78.0–100.0)
MONOS PCT: 9 %
Monocytes Absolute: 0.9 10*3/uL (ref 0.1–1.0)
NEUTROS PCT: 64 %
Neutro Abs: 6.3 10*3/uL (ref 1.7–7.7)
Platelets: 305 10*3/uL (ref 150–400)
RBC: 3.7 MIL/uL — ABNORMAL LOW (ref 3.87–5.11)
RDW: 12.7 % (ref 11.5–15.5)
WBC: 9.8 10*3/uL (ref 4.0–10.5)

## 2017-09-06 LAB — HIV ANTIBODY (ROUTINE TESTING W REFLEX): HIV Screen 4th Generation wRfx: NONREACTIVE

## 2017-09-06 MED ORDER — LIDOCAINE HCL (PF) 1 % IJ SOLN
INTRAMUSCULAR | Status: AC
  Filled 2017-09-06: qty 30

## 2017-09-06 MED ORDER — IOPAMIDOL (ISOVUE-300) INJECTION 61%
100.0000 mL | Freq: Once | INTRAVENOUS | Status: AC | PRN
  Administered 2017-09-06: 100 mL via INTRAVENOUS

## 2017-09-06 MED ORDER — VANCOMYCIN HCL 10 G IV SOLR
1250.0000 mg | Freq: Once | INTRAVENOUS | Status: AC
  Administered 2017-09-06: 1250 mg via INTRAVENOUS
  Filled 2017-09-06: qty 1250

## 2017-09-06 MED ORDER — MIDAZOLAM HCL 2 MG/2ML IJ SOLN
INTRAMUSCULAR | Status: AC
  Filled 2017-09-06: qty 4

## 2017-09-06 MED ORDER — SODIUM CHLORIDE 0.9 % IV SOLN
2.0000 g | INTRAVENOUS | Status: DC
  Administered 2017-09-06 – 2017-09-12 (×7): 2 g via INTRAVENOUS
  Filled 2017-09-06 (×7): qty 20

## 2017-09-06 MED ORDER — FENTANYL CITRATE (PF) 100 MCG/2ML IJ SOLN
INTRAMUSCULAR | Status: AC | PRN
  Administered 2017-09-06 (×2): 50 ug via INTRAVENOUS

## 2017-09-06 MED ORDER — GADOBENATE DIMEGLUMINE 529 MG/ML IV SOLN
15.0000 mL | Freq: Once | INTRAVENOUS | Status: AC
  Administered 2017-09-06: 15 mL via INTRAVENOUS

## 2017-09-06 MED ORDER — HYDROMORPHONE HCL 2 MG/ML IJ SOLN
1.0000 mg | INTRAMUSCULAR | Status: DC | PRN
  Administered 2017-09-06 – 2017-09-08 (×8): 1 mg via INTRAVENOUS
  Filled 2017-09-06 (×10): qty 1

## 2017-09-06 MED ORDER — FENTANYL CITRATE (PF) 100 MCG/2ML IJ SOLN
INTRAMUSCULAR | Status: AC
Start: 2017-09-06 — End: 2017-09-07
  Filled 2017-09-06: qty 2

## 2017-09-06 MED ORDER — MIDAZOLAM HCL 2 MG/2ML IJ SOLN
INTRAMUSCULAR | Status: AC | PRN
  Administered 2017-09-06: 2 mg via INTRAVENOUS

## 2017-09-06 MED ORDER — HYDROCODONE-ACETAMINOPHEN 5-325 MG PO TABS
1.0000 | ORAL_TABLET | ORAL | Status: DC | PRN
  Administered 2017-09-07: 1 via ORAL
  Administered 2017-09-07: 2 via ORAL
  Administered 2017-09-08 (×3): 1 via ORAL
  Administered 2017-09-09: 2 via ORAL
  Administered 2017-09-09: 1 via ORAL
  Administered 2017-09-10 – 2017-09-12 (×4): 2 via ORAL
  Filled 2017-09-06: qty 2
  Filled 2017-09-06 (×2): qty 1
  Filled 2017-09-06 (×3): qty 2
  Filled 2017-09-06: qty 1
  Filled 2017-09-06 (×2): qty 2
  Filled 2017-09-06 (×2): qty 1

## 2017-09-06 MED ORDER — IOPAMIDOL (ISOVUE-300) INJECTION 61%
INTRAVENOUS | Status: AC
  Filled 2017-09-06: qty 100

## 2017-09-06 MED ORDER — AMPHETAMINE-DEXTROAMPHETAMINE 10 MG PO TABS
30.0000 mg | ORAL_TABLET | Freq: Two times a day (BID) | ORAL | Status: DC
  Administered 2017-09-06 – 2017-09-12 (×13): 30 mg via ORAL
  Filled 2017-09-06 (×13): qty 3

## 2017-09-06 MED ORDER — VANCOMYCIN HCL IN DEXTROSE 1-5 GM/200ML-% IV SOLN
1000.0000 mg | Freq: Two times a day (BID) | INTRAVENOUS | Status: DC
  Administered 2017-09-07 – 2017-09-10 (×7): 1000 mg via INTRAVENOUS
  Filled 2017-09-06 (×7): qty 200

## 2017-09-06 NOTE — Procedures (Signed)
  Procedure: US aspiration L post paraspinal collection 22ml serosanguinous EBL:   minimal Complications:  none immediate  See full dictation in [employer].  Thora Lance MD Main # [phone] Pager  [phone]

## 2017-09-06 NOTE — Progress Notes (Signed)
I came to 5c to do the echo but the patient was OTF.

## 2017-09-06 NOTE — ED Notes (Signed)
Patient was given a pillow. 

## 2017-09-06 NOTE — Progress Notes (Signed)
ANTIBIOTIC CONSULT NOTE - INITIAL  Pharmacy Consult for Vancomycin  Indication: osteomyelitis  Allergies  Allergen Reactions  . Levaquin [Levofloxacin In D5w] Shortness Of Breath  . Bactrim [Sulfamethoxazole-Trimethoprim] Other (See Comments)    "really red all over, hot skinned"  . Doxycycline Hives  . Other     Seasonal allergies  . Latex Rash    Patient Measurements: Height: 5\' 7"  (170.2 cm) Weight: 162 lb (73.5 kg) IBW/kg (Calculated) : 61.6   Vital Signs: BP: 119/89 (04/26 1541) Pulse Rate: 63 (04/26 1541) Intake/Output from previous day: No intake/output data recorded. Intake/Output from this shift: No intake/output data recorded.  Labs: Recent Labs    09/05/17 1247 09/06/17 0501  WBC 9.8 9.8  HGB 13.7 12.0  PLT 340 305  CREATININE 0.83 0.78   Estimated Creatinine Clearance: 83.6 mL/min (by C-G formula based on SCr of 0.78 mg/dL).   Microbiology: No results found for this or any previous visit (from the past 720 hour(s)).  Medical History: Past Medical History:  Diagnosis Date  . Anxiety and depression   . Arthritis   . Asthma   . Chronic pain    back,legs,neck  . Complication of anesthesia    cries  . Depression   . Fibromyalgia   . Gastroenteritis, acute    w/ dehydration  . Hypothyroidism   . Migraine headache   . Multiple allergies   . Multiple sclerosis (HCC)   . Neuromuscular disorder (HCC)    neuropathy pain  . Ovarian mass November 11 2012   left   . Rheumatic disease    Goal of Therapy:  Vancomycin trough level 15-20 mcg/ml  Plan:  Measure antibiotic drug levels at steady state Follow up culture results Give Vanc 1250 mg IV x 1, followed by 1000 mg q12hr  Tera Mater, PharmD 09/06/2017,4:17 PM

## 2017-09-06 NOTE — Progress Notes (Signed)
PROGRESS NOTE    [name]  ZOX:[id number] DOB: [birth year] DOA: 09/05/2017 PCP: Primus Bravo, NP   Brief Narrative:  47 year old with past medical history relevant for relapsing remitting multiple sclerosis, chronic low back pain, Hypothyroidism, seizure disorder, who came in for acute on chronic severe back pain and found to have large paraspinal fluid collection.   Assessment & Plan:   Principal Problem:   Paraspinal abscess (HCC) Active Problems:   Multiple sclerosis (HCC)   Depression with anxiety   Hypothyroidism   Chronic pain   #) Paraspinal fluid collection: Unclear etiology.  Recent concern for either metastatic disease or local complication from spine injections.  Per neurosurgery does not involve the spine and as such they recommended interventional radiology to drain the fluid collection. -ID following appreciate recommendations -IR to drain fluid and send out for cell count, differential, bacterial and fungal cultures -Start IV vancomycin and ceftriaxone on 09/06/2017 -Echo pending  #) Hypothyroidism: -Continue levothyroxine 100 mcg, must be brand only -Continue liothyronine 5 mcg daily  #) Seizure disorder: - Continue levetiracetam 1500 mg twice daily  #) Relapsing remitting multiple sclerosis: - Hold ocrelizumab infusion -Continue baclofen 10 mg 3 times daily -Continue oxybutynin 5 mg nightly  #) Chronic diarrhea: -Continue colestipol 1 g daily -Continue Lomotil as needed  #) Pain/psych: - Continue brexpiprazole 2 mg daily -Continue buspirone 10 mg 3 times daily -Continue celecoxib 100 mg twice daily -Continue duloxetine 30 mg nightly and 60 mg daily -Continue glycopyrrolate 2 mg daily -Hold intranasal ketorolac - Continue as needed Percocet -Continue 3 times daily pregabalin 150 mg - Continue suvorexant 20 mg daily -Continue tizanidine 6 mg 3 times daily  Fluids: Tolerating p.o. Electrolytes: Monitor and supplement Nutrition: Regular  diet  Prophylaxis: SCDs  Disposition: Pending drainage of fluid and antibiotic plan  Full code  Consultants:   Infectious disease  Interventional radiology  Neurosurgery  Procedures: (Don't include imaging studies which can be auto populated. Include things that cannot be auto populated i.e. Echo, Carotid and venous dopplers, Foley, Bipap, HD, tubes/drains, wound vac, central lines etc)  IR guided drainage of paraspinal abscess on 09/06/2017  Antimicrobials: (specify start and planned stop date. Auto populated tables are space occupying and do not give end dates)  IV ceftriaxone and vancomycin started 09/06/2017   Subjective: Patient reports he continues to have severe pain over the midportion of her thoracic back that is different than her chronic back pain.  She does report fevers daily at home 203 but denies any cough, congestion, rhinorrhea, chills, abdominal pain, chest pain.  Objective: Vitals:   09/06/17 1441 09/06/17 1501 09/06/17 1512 09/06/17 1541  BP: 99/60 100/61 102/67 119/89  Pulse: 60 61 65 63  Resp: 17 18 20    Temp:      TempSrc:      SpO2: 98% 98% 99% 96%  Weight:      Height:       No intake or output data in the 24 hours ending 09/06/17 1648 Filed Weights   09/05/17 1215  Weight: 73.5 kg (162 lb)    Examination:  General exam: Appears calm and comfortable  Respiratory system: Clear to auscultation. Respiratory effort normal. Cardiovascular system: Regular rate and rhythm, no murmurs Gastrointestinal system: Soft, nondistended, plus bowel sounds, no rebound or guarding Central nervous system: Alert and oriented. No focal neurological deficits. Extremities: No lower extremity edema Back: Significant amounts of pain and tenderness throughout spine worsening thoracic area, limited range of motion of the spine Skin:  No rashes on visible skin Psychiatry: Judgement and insight appear normal. Mood & affect appropriate.     Data Reviewed: I have  personally reviewed following labs and imaging studies  CBC: Recent Labs  Lab 09/05/17 1247 09/06/17 0501  WBC 9.8 9.8  NEUTROABS 7.3 6.3  HGB 13.7 12.0  HCT 41.4 37.5  MCV 100.7* 101.4*  PLT 340 305   Basic Metabolic Panel: Recent Labs  Lab 09/05/17 1247 09/06/17 0501  NA 142 137  K 5.4* 5.0  CL 106 101  CO2 28 24  GLUCOSE 107* 96  BUN 14 20  CREATININE 0.83 0.78  CALCIUM 10.2 9.4   GFR: Estimated Creatinine Clearance: 83.6 mL/min (by C-G formula based on SCr of 0.78 mg/dL). Liver Function Tests: Recent Labs  Lab 09/05/17 1247  AST 35  ALT 26  ALKPHOS 81  BILITOT 0.4  PROT 7.7  ALBUMIN 3.4*   No results for input(s): LIPASE, AMYLASE in the last 168 hours. No results for input(s): AMMONIA in the last 168 hours. Coagulation Profile: No results for input(s): INR, PROTIME in the last 168 hours. Cardiac Enzymes: No results for input(s): CKTOTAL, CKMB, CKMBINDEX, TROPONINI in the last 168 hours. BNP (last 3 results) No results for input(s): PROBNP in the last 8760 hours. HbA1C: No results for input(s): HGBA1C in the last 72 hours. CBG: No results for input(s): GLUCAP in the last 168 hours. Lipid Profile: No results for input(s): CHOL, HDL, LDLCALC, TRIG, CHOLHDL, LDLDIRECT in the last 72 hours. Thyroid Function Tests: No results for input(s): TSH, T4TOTAL, FREET4, T3FREE, THYROIDAB in the last 72 hours. Anemia Panel: No results for input(s): VITAMINB12, FOLATE, FERRITIN, TIBC, IRON, RETICCTPCT in the last 72 hours. Sepsis Labs: Recent Labs  Lab 09/05/17 1336  LATICACIDVEN 1.23    No results found for this or any previous visit (from the past 240 hour(s)).       Radiology Studies: Ct Chest W Contrast  Result Date: 09/06/2017 CLINICAL DATA:  Complicated pneumonia. Abscess seen on yesterday's MRI. EXAM: CT CHEST WITH CONTRAST TECHNIQUE: Multidetector CT imaging of the chest was performed during intravenous contrast administration. CONTRAST:   ISOVUE-300 IOPAMIDOL (ISOVUE-300) INJECTION 61% COMPARISON:  MRI thoracic and lumbar spine September 05, 2017 FINDINGS: CARDIOVASCULAR: Heart and pericardium are unremarkable. Thoracic aorta is normal course and caliber, unremarkable. MEDIASTINUM/NODES: No mediastinal mass. No lymphadenopathy by CT size criteria. Normal appearance of thoracic esophagus though not tailored for evaluation. LUNGS/PLEURA: Tracheobronchial tree is patent, no pneumothorax. Small LEFT pleural effusion with subpulmonic component. Lobulated pleural collection measuring to 2.1 cm corresponding to known abscess. UPPER ABDOMEN: Nonacute. 3 mm LEFT interpolar nephrolithiasis. 13 mm cyst LEFT lobe of the liver. MUSCULOSKELETAL: LEFT posterior nondisplaced T8 and T9 rib fractures with associated lytic component T9 (series 5, image 84). Mild asymmetrically prominent LEFT paraspinal muscles without discrete fluid collection by CT though, demonstrated on yesterday's MRI. IMPRESSION: 1. Nondisplaced LEFT T8 and T9 fractures with lytic component consistent with pathologic fracture and osteomyelitis. Associated 2.1 cm focal pleural versus parenchymal abscess and small empyema better demonstrated on yesterday's MRI. 2. Mildly prominent LEFT paraspinal muscles corresponding to known abscess. 3. 3 mm nonobstructing LEFT nephrolithiasis. Electronically Signed   By: Awilda Metro [name]   On: 09/06/2017 04:08   Mr Thoracic Spine W Wo Contrast  Result Date: 09/06/2017 CLINICAL DATA:  Persistent fevers. History of spinal abscess and bony destruction. EXAM: MRI THORACIC AND LUMBAR SPINE WITHOUT AND WITH CONTRAST TECHNIQUE: Multiplanar and multiecho pulse sequences of the thoracic  and lumbar spine were obtained without and with intravenous contrast. CONTRAST:  15mL MULTIHANCE GADOBENATE DIMEGLUMINE 529 MG/ML IV SOLN COMPARISON:  MRI thoracic spine Sep 29, 2012 and MRI lumbar spine May 09, 2011 FINDINGS: MRI THORACIC SPINE FINDINGS ALIGNMENT: Maintenance  of the thoracic kyphosis. No malalignment. VERTEBRAE/DISCS: Vertebral bodies are intact. Intervertebral discs morphology and signal are normal. No abnormal disc enhancement. Mild bright STIR signal and enhancement bilateral T4-5 facets. LEFT ninth rib abnormal enhancement with suspected fracture. Associated 14 x 11 mm pleural to sub pleural fluid collection and, an adjacent 4 x 13 mm suspected empyema (axial 30/39). CORD: Thoracic spinal cord is normal morphology and signal characteristics. No abnormal cord, leptomeningeal or epidural enhancement. PREVERTEBRAL AND PARASPINAL SOFT TISSUES: Small LEFT pleural effusion. Mild LEFT lower thoracic paraspinal interstitial edema and enhancement. DISC LEVELS: No disc bulge, canal stenosis nor neural foraminal narrowing. MRI LUMBAR SPINE FINDINGS SEGMENTATION: For the purposes of this report, the last well-formed intervertebral disc is reported as L5-S1. ALIGNMENT: Maintained lumbar lordosis. No malalignment. VERTEBRAE: Vertebral bodies are intact. Mild similar L5-S1 disc height loss, minimal L3-4 and L4-5 with disc desiccation and mild subacute discogenic endplate changes. No acute or abnormal bone marrow signal. No abnormal osseous or disc enhancement. No abnormal bone marrow signal. No abnormal osseous or intradiscal enhancement. CONUS MEDULLARIS AND CAUDA EQUINA: Conus medullaris terminates at L1-2 and demonstrates normal morphology and signal characteristics. Cauda equina is normal. No abnormal cord, leptomeningeal or epidural enhancement. PARASPINAL AND OTHER SOFT TISSUES: 1.1 x 4.5 x 7.9 cm (AP by transverse by CC) LEFT paraspinal subfascial abscess from T11-12 to L2, superficial to paraspinal muscles. DISC LEVELS: T12-L1, L1-2, L2-3: No disc bulge, canal stenosis nor neural foraminal narrowing. L3-4: Similar 4 mm broad-based disc bulge/central disc protrusion with enhancing annular fissure. No canal stenosis or neural foraminal narrowing. L4-5: Similar 5 mm  broad-based disc bulge/LEFT central disc protrusion with enhancing annular fissure. Mild canal stenosis. No neural foraminal narrowing. L5-S1: Similar small central disc protrusion with 8 mm contiguous inferior migration and enhancement. Small broad-based disc bulge. Mild facet arthropathy without canal stenosis. Mild LEFT neural foraminal narrowing. IMPRESSION: MRI thoracic spine: 1. 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 Small LEFT pleural effusion and trace suspected empyema. Findings would be better demonstrated on CT chest with contrast. 2. LEFT paraspinal myositis. 3. Mild symmetric T4-5 facet edema favoring inflammatory changes, less likely infection/septic arthropathy. MRI lumbar spine: 1. 1.1 x 4.5 x 7.9 cm LEFT superficial paraspinal abscess. 2. No discitis, osteomyelitis or epidural abscess. 3. Stable degenerative change of the lumbar spine. No canal stenosis. Mild LEFT L5-S1 neural foraminal narrowing. These results will be called to the ordering clinician or representative by the Radiologist Assistant, and communication documented in the PACS or zVision Dashboard. Electronically Signed   By: Awilda Metro [name]   On: 09/06/2017 01:38   Mr Lumbar Spine W Wo Contrast  Result Date: 09/06/2017 CLINICAL DATA:  Persistent fevers. History of spinal abscess and bony destruction. EXAM: MRI THORACIC AND LUMBAR SPINE WITHOUT AND WITH CONTRAST TECHNIQUE: Multiplanar and multiecho pulse sequences of the thoracic and lumbar spine were obtained without and with intravenous contrast. CONTRAST:  15mL MULTIHANCE GADOBENATE DIMEGLUMINE 529 MG/ML IV SOLN COMPARISON:  MRI thoracic spine Sep 29, 2012 and MRI lumbar spine May 09, 2011 FINDINGS: MRI THORACIC SPINE FINDINGS ALIGNMENT: Maintenance of the thoracic kyphosis. No malalignment. VERTEBRAE/DISCS: Vertebral bodies are intact. Intervertebral discs morphology and signal are normal. No abnormal  disc  enhancement. Mild bright STIR signal and enhancement bilateral T4-5 facets. LEFT ninth rib abnormal enhancement with suspected fracture. Associated 14 x 11 mm pleural to sub pleural fluid collection and, an adjacent 4 x 13 mm suspected empyema (axial 30/39). CORD: Thoracic spinal cord is normal morphology and signal characteristics. No abnormal cord, leptomeningeal or epidural enhancement. PREVERTEBRAL AND PARASPINAL SOFT TISSUES: Small LEFT pleural effusion. Mild LEFT lower thoracic paraspinal interstitial edema and enhancement. DISC LEVELS: No disc bulge, canal stenosis nor neural foraminal narrowing. MRI LUMBAR SPINE FINDINGS SEGMENTATION: For the purposes of this report, the last well-formed intervertebral disc is reported as L5-S1. ALIGNMENT: Maintained lumbar lordosis. No malalignment. VERTEBRAE: Vertebral bodies are intact. Mild similar L5-S1 disc height loss, minimal L3-4 and L4-5 with disc desiccation and mild subacute discogenic endplate changes. No acute or abnormal bone marrow signal. No abnormal osseous or disc enhancement. No abnormal bone marrow signal. No abnormal osseous or intradiscal enhancement. CONUS MEDULLARIS AND CAUDA EQUINA: Conus medullaris terminates at L1-2 and demonstrates normal morphology and signal characteristics. Cauda equina is normal. No abnormal cord, leptomeningeal or epidural enhancement. PARASPINAL AND OTHER SOFT TISSUES: 1.1 x 4.5 x 7.9 cm (AP by transverse by CC) LEFT paraspinal subfascial abscess from T11-12 to L2, superficial to paraspinal muscles. DISC LEVELS: T12-L1, L1-2, L2-3: No disc bulge, canal stenosis nor neural foraminal narrowing. L3-4: Similar 4 mm broad-based disc bulge/central disc protrusion with enhancing annular fissure. No canal stenosis or neural foraminal narrowing. L4-5: Similar 5 mm broad-based disc bulge/LEFT central disc protrusion with enhancing annular fissure. Mild canal stenosis. No neural foraminal narrowing. L5-S1: Similar small central disc  protrusion with 8 mm contiguous inferior migration and enhancement. Small broad-based disc bulge. Mild facet arthropathy without canal stenosis. Mild LEFT neural foraminal narrowing. IMPRESSION: MRI thoracic spine: 1. 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 Small LEFT pleural effusion and trace suspected empyema. Findings would be better demonstrated on CT chest with contrast. 2. LEFT paraspinal myositis. 3. Mild symmetric T4-5 facet edema favoring inflammatory changes, less likely infection/septic arthropathy. MRI lumbar spine: 1. 1.1 x 4.5 x 7.9 cm LEFT superficial paraspinal abscess. 2. No discitis, osteomyelitis or epidural abscess. 3. Stable degenerative change of the lumbar spine. No canal stenosis. Mild LEFT L5-S1 neural foraminal narrowing. These results will be called to the ordering clinician or representative by the Radiologist Assistant, and communication documented in the PACS or zVision Dashboard. Electronically Signed   By: Awilda Metro [name]   On: 09/06/2017 01:38   US Guided Needle Placement  Result Date: 09/06/2017 CLINICAL DATA:  History of spinal infection. Recent MR demonstrates left superficial paraspinal abscess. EXAM: IR ULTRASOUND GUIDED ASPIRATION COMPARISON:  MR 09/05/2017 TECHNIQUE: The procedure, risks (including but not limited to bleeding, infection, organ damage ), benefits, and alternatives were explained to the patient. Questions regarding the procedure were encouraged and answered. The patient understands and consents to the procedure. Intravenous Fentanyl and Versed were administered as conscious sedation during continuous monitoring of the patient's level of consciousness and physiological / cardiorespiratory status by the radiology RN, with a total moderate sedation time of 10 minutes. Survey ultrasound of the left paraspinal region was performed and the linear fluid collection was localized. Site was marked, prepped  with chlorhexidine, draped in usual sterile fashion, infiltrated locally with 1% lidocaine. Under real-time ultrasound guidance, an 18 gauge spinal needle was advanced into the collection. Approximately 1 mL of turbid serosanguineous fluid was aspirated, sent for Gram stain  and culture. Postprocedure scans show no hemorrhage or other apparent complication. The patient tolerated the procedure well. IMPRESSION: 1. Technically successful aspiration of left superficial lumbar paraspinal abscess, sample sent for Gram stain and culture. Electronically Signed   By: Corlis Leak [name]   On: 09/06/2017 15:48        Scheduled Meds: . amphetamine-dextroamphetamine  30 mg Oral BID  . baclofen  10 mg Oral TID  . Brexpiprazole  2 mg Oral Daily  . busPIRone  10 mg Oral TID  . DULoxetine  30 mg Oral QHS  . DULoxetine  60 mg Oral Daily  . levETIRAcetam  1,500 mg Oral BID  . levothyroxine  100 mcg Oral QAC breakfast  . lidocaine (PF)      . liothyronine  5 mcg Oral Daily  . magnesium oxide  400 mg Oral Daily  . multivitamin with minerals  1 tablet Oral Daily  . olopatadine  1 drop Both Eyes BID  . pantoprazole  40 mg Oral BID  . pregabalin  150 mg Oral TID  . sodium chloride flush  3 mL Intravenous Q12H  . Suvorexant  20 mg Oral QHS  . tiZANidine  6 mg Oral TID   Continuous Infusions: . sodium chloride    . cefTRIAXone (ROCEPHIN)  IV 2 g (09/06/17 1642)  . vancomycin    . [START ON 09/07/2017] vancomycin       LOS: 1 day    Time spent: 35    Delaine Lame, MD [facility]  If 7PM-7AM, please contact night-coverage [email or web address] Password [facility] 09/06/2017, 4:48 PM

## 2017-09-06 NOTE — ED Notes (Addendum)
Patient NPO, No Diet was ordered for Lunch. 

## 2017-09-06 NOTE — Consult Note (Signed)
[facility]    Date of Admission:  09/05/2017     Total days of antibiotics 0               Reason for Consult: paraspinal abscess    Referring Provider: Purohit Primary Care Provider: Primus Bravo, NP   Assessment: 47 y.o. 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 She fell a month ago and sustained a rib fracture to 9th rib and now has a surrounding abscess with pleural effusion and small empyema. Would suspect the paraspinal abscess formed in this setting. Hold antibiotics for now given she is stable.   Plan: 1. Hold antibiotics until IR sampling complete 2. In addition to aerobic/anaerobic cultures would add fungal and AFB on back aspirate (on immunomodulator for MS)  3. Please check Hepatitis C for health maintenance   Principal Problem:   Paraspinal abscess (HCC) Active Problems:   Multiple sclerosis (HCC)   Depression with anxiety   Hypothyroidism   Chronic pain   . baclofen  10 mg Oral TID  . Brexpiprazole  2 mg Oral Daily  . busPIRone  10 mg Oral TID  . DULoxetine  30 mg Oral QHS  . DULoxetine  60 mg Oral Daily  . iopamidol      . levETIRAcetam  1,500 mg Oral BID  . levothyroxine  100 mcg Oral QAC breakfast  . liothyronine  5 mcg Oral Daily  . magnesium oxide  400 mg Oral Daily  . multivitamin with minerals  1 tablet Oral Daily  . olopatadine  1 drop Both Eyes BID  . pantoprazole  40 mg Oral BID  . pregabalin  150 mg Oral TID  . sodium chloride flush  3 mL Intravenous Q12H  . Suvorexant  20 mg Oral QHS  . tiZANidine  6 mg Oral TID    HPI: [name] is a 47 y.o. female with past medical history detailed below but significant for chronic back pain, DDD, fibromyalgia, anxiety, GERD and multiple sclerosis. She has been admitted to [facility] for management of her thoracolumbar abscess.    She has been having low back pain and muscle  tightness since April 9th. She was admitted to [facility] for this on 4/17 - 4/19 after MRI finding of subcutaneous edema within the paraspinal soft tissues T9-L2 with peripheral enhancing focal fluid collection measuring at least 3.6 x 0.7 x 10.4 cm. IR was consulted at the time and did not feel that the collection required drainage. Last dose of antibiotics were received in the hospital prior to her leaving AMA due to poor pain control and severe constipation. She saw her PCP yesterday and was referred to emergency room as she continued to have fevers at home and severe, debilitating back pain. Of note she was also seen in the ED on April 14th for fevers and left flank pain x 5 days with some associated nausea/vomiting. Chest x-ray was obtained and noted rib fracture (fell about a month ago and struck her back) as well as L>R endobronchial thickening, bronchiectasis and hazy airspace opacity. Treated her back pain and presumed viral illness at that time and no antibiotics provided.   Hospital Course:  Upon arrival to the ED she was stable hemodynamically and normal lactic acid. Neurosurgery was consulted by ED physician and MRI results were reviewed from 4/17 and reported no indication for surgery considering no epidural involvement. Infectious  disease was consulted over the phone and recommended to hold antibiotics and consult our IR team for consideration of drainage. Also recommended repeat MRI and check blood cultures.  MRI thoracic/lumbar spine - Left 9th rib osteomyelitis and potential pathologic fracture associated with 11 x 14 mm abscess, small left pleural effusion and trace suspected empyema. Left paraspinal myositis with Left superficial paraspinal abscess measuring 1.1 x 4.5 x 7.9 cm w/o discitis or osteomyelitis or epidural abscess.   She receives ocrelizumab infusions for her multiple sclerosis every 6 months which has been put on hold until infection resolves. Has been on this regimen  for about 18 months now and has had some improvement in her symptoms. Follows with [facility] Neurology team for this.   Review of Systems: Review of Systems  Constitutional: Positive for chills, fever and malaise/fatigue. Negative for weight loss.  Respiratory: Positive for cough and sputum production. Negative for shortness of breath.   Cardiovascular: Negative for chest pain.  Gastrointestinal: Negative for abdominal pain, diarrhea and vomiting.  Genitourinary: Negative.   Musculoskeletal: Positive for back pain (lumbar pain ) and falls. Negative for neck pain.  Skin: Negative for rash.  Neurological: Negative for dizziness and focal weakness.    Past Medical History:  Diagnosis Date  . Anxiety and depression   . Arthritis   . Asthma   . Chronic pain    back,legs,neck  . Complication of anesthesia    cries  . Depression   . Fibromyalgia   . Gastroenteritis, acute    w/ dehydration  . Hypothyroidism   . Migraine headache   . Multiple allergies   . Multiple sclerosis (HCC)   . Neuromuscular disorder (HCC)    neuropathy pain  . Ovarian mass November 11 2012   left   . Rheumatic disease     Social History   Tobacco Use  . Smoking status: Former Smoker    Packs/day: 1.00    Types: Cigarettes    Last attempt to quit: 09/21/2014    Years since quitting: 2.9  . Smokeless tobacco: Never Used  Substance Use Topics  . Alcohol use: No  . Drug use: No    Family History  Problem Relation Age of Onset  . Rheum arthritis Mother   . Lupus Mother   . Sjogren's syndrome Mother   . Lupus Father   . Cancer - Other Father   . Cancer - Other Brother        colon in 1 brother   Allergies  Allergen Reactions  . Levaquin [Levofloxacin In D5w] Shortness Of Breath  . Bactrim [Sulfamethoxazole-Trimethoprim] Other (See Comments)    "really red all over, hot skinned"  . Doxycycline Hives  . Other     Seasonal allergies  . Latex Rash    OBJECTIVE: Blood pressure 115/63, pulse 70,  temperature 98.5 F (36.9 C), temperature source Oral, resp. rate 16, height 5\' 7"  (1.702 m), weight 162 lb (73.5 kg), SpO2 95 %.  Physical Exam  Constitutional: She is oriented to person, place, and time. She appears well-developed.  HENT:  Mouth/Throat: No oropharyngeal exudate.  Neck: Normal range of motion.  Cardiovascular: Normal rate, regular rhythm, normal heart sounds and intact distal pulses.  No murmur heard. Pulmonary/Chest: Effort normal. No respiratory distress. She has rales (left). She exhibits tenderness (posterior ).  Abdominal: Soft. Bowel sounds are normal.  Musculoskeletal: Normal range of motion.  Lymphadenopathy:    She has no cervical adenopathy.  Neurological: She is alert and oriented  to person, place, and time. She displays normal reflexes. She exhibits normal muscle tone.  Skin: Skin is warm and dry.  Psychiatric: She has a normal mood and affect.  Tearful at times during conversation   Nursing note and vitals reviewed.   Lab Results Lab Results  Component Value Date   WBC 9.8 09/06/2017   HGB 12.0 09/06/2017   HCT 37.5 09/06/2017   MCV 101.4 (H) 09/06/2017   PLT 305 09/06/2017    Lab Results  Component Value Date   CREATININE 0.78 09/06/2017   BUN 20 09/06/2017   NA 137 09/06/2017   K 5.0 09/06/2017   CL 101 09/06/2017   CO2 24 09/06/2017    Lab Results  Component Value Date   ALT 26 09/05/2017   AST 35 09/05/2017   ALKPHOS 81 09/05/2017   BILITOT 0.4 09/05/2017     Microbiology: No results found for this or any previous visit (from the past 240 hour(s)).  Rexene Alberts, MSN, NP-C [facility] for Infectious Disease [facility] Cell: [phone] Pager: [phone]  09/06/2017 11:34 AM

## 2017-09-06 NOTE — Progress Notes (Signed)
Chief Complaint: Patient was seen in consultation today for  Chief Complaint  Patient presents with  . Fever  . Back Pain   at the request of Dr. Arlyn Leak  Referring Physician(s):  Dr. Arlyn Leak  Supervising Physician: Oley Balm  Patient Status: [facility] - In-pt  History of Present Illness: [name] is a 47 y.o. female admitted with fever and back pain. 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 She's had new MRI which now shows some progression of the lumbar paraspinous abscess. As well there is osteo and small abscess of the left 9th rib. ID and Neurosurgery consulted and apparently recommend IR aspiration for fluid sampling/culture. Chart, imaging, meds, labs reviewed.   Past Medical History:  Diagnosis Date  . Anxiety and depression   . Arthritis   . Asthma   . Chronic pain    back,legs,neck  . Complication of anesthesia    cries  . Depression   . Fibromyalgia   . Gastroenteritis, acute    w/ dehydration  . Hypothyroidism   . Migraine headache   . Multiple allergies   . Multiple sclerosis (HCC)   . Neuromuscular disorder (HCC)    neuropathy pain  . Ovarian mass November 11 2012   left   . Rheumatic disease     Past Surgical History:  Procedure Laterality Date  . BUNIONECTOMY     both feet  . CARPAL TUNNEL RELEASE Left 10/28/2012   Procedure: CARPAL TUNNEL RELEASE;  Surgeon: Nicki Reaper, MD;  Location: [facility];  Service: Orthopedics;  Laterality: Left;  . CHOLECYSTECTOMY  09/2014   in [place], portion of liver removed- cyst  . COLONOSCOPY W/ POLYPECTOMY  2012  . DILATION AND CURETTAGE OF UTERUS    . ELBOW ARTHROSCOPY     rt  . ELBOW SURGERY Right may 2014   tendon rair  . FOOT NEUROMA SURGERY     both feet  . HERNIA REPAIR  05/2015   umbilical  . MYOMECTOMY ABDOMINAL APPROACH    . OPEN REDUCTION INTERNAL FIXATION (ORIF) DISTAL RADIAL FRACTURE Left 10/28/2012   Procedure:  OPEN REDUCTION INTERNAL FIXATION (ORIF) DISTAL RADIAL FRACTURE;  Surgeon: Nicki Reaper, MD;  Location:  SURGERY CENTER;  Service: Orthopedics;  Laterality: Left;  . OVARY SURGERY     rt 1987  . ROTATOR CUFF REPAIR  2000   rt  . TONSILLECTOMY    . WRIST SURGERY Right 2006    Allergies: Levaquin [levofloxacin in d5w]; Bactrim [sulfamethoxazole-trimethoprim]; Doxycycline; Other; and Latex  Medications: Prior to Admission medications   Medication Sig Start Date End Date Taking? Authorizing Provider  albuterol (PROVENTIL) (2.5 MG/3ML) 0.083% nebulizer solution As needed 05/14/15  Yes [provider]  amphetamine-dextroamphetamine (ADDERALL) 30 MG tablet Take 30 mg by mouth 2 (two) times daily.  09/15/12  Yes [provider]  baclofen (LIORESAL) 10 MG tablet Take 10 mg by mouth 3 (three) times daily.   Yes [provider]  Biotin 5000 MCG CAPS Take 1 capsule by mouth daily.   Yes [provider]  Brexpiprazole (REXULTI) 2 MG TABS Take 2 mg by mouth daily.  07/13/16  Yes [provider]  busPIRone (BUSPAR) 10 MG tablet 10 mg 3 (three) times daily. 07/13/16  Yes [provider]  celecoxib (CELEBREX) 100 MG capsule 100 mg 2 (two) times daily. 07/16/16  Yes [provider]  cetirizine (ZYRTEC) 10 MG tablet Take 10 mg  by mouth daily.   Yes [provider]  diphenoxylate-atropine (LOMOTIL) 2.5-0.025 MG tablet 2.5-0.025,mg as needed 03/15/15  Yes [provider]  DULoxetine (CYMBALTA) 30 MG capsule Take 30 mg by mouth at bedtime. 05/17/16  Yes [provider]  DULoxetine (CYMBALTA) 60 MG capsule Take 60 mg by mouth daily.  09/02/12  Yes [provider]  fluticasone (FLONASE) 50 MCG/ACT nasal spray Place 2 sprays into both nostrils daily. 50 mcg 04/25/15  Yes [provider]  furosemide (LASIX) 40 MG tablet Take 40 mg by mouth daily.  04/24/15  Yes [provider]  Ketorolac Tromethamine  (SPRIX) 15.75 MG/SPRAY SOLN Place 1 spray into the nose every 6 (six) hours as needed.  05/13/17  Yes [provider]  levETIRAcetam (KEPPRA) 750 MG tablet 1,500 mg 2 (two) times daily.  09/08/12  Yes [provider]  levothyroxine (SYNTHROID) 100 MCG tablet Take 100 mcg by mouth daily. Can not take generic ( MUST USE BRAND ONLY) 06/19/17  Yes [provider]  liothyronine (CYTOMEL) 5 MCG tablet Take 5 mcg by mouth daily.  05/22/16  Yes [provider]  magnesium oxide (MAG-OX) 400 MG tablet Take 400 mg by mouth daily.   Yes [provider]  Multiple Vitamins-Minerals (MULTIVITAMIN WITH MINERALS) tablet Take 1 tablet by mouth daily.   Yes [provider]  ocrelizumab (OCREVUS) 300 MG/10ML injection 300 mg IV once every 6 months 02/06/16  Yes [provider]  olopatadine (PATANOL) 0.1 % ophthalmic solution PLACE ONE DROP INTO BOTH EYES 2 (TWO) TIMES DAILY. 06/20/17  Yes [provider]  ondansetron (ZOFRAN) 8 MG tablet Take 8 mg by mouth as needed. 07/25/17  Yes [provider]  oxybutynin (DITROPAN-XL) 5 MG 24 hr tablet Take 1 tablet (5 mg total) by mouth at bedtime. 06/25/17  Yes Penumalli, Glenford Bayley, MD  oxyCODONE-acetaminophen (PERCOCET) 10-325 MG per tablet Take 1 tablet by mouth every 4 (four) hours as needed for pain. 10/28/12  Yes Cindee Salt, MD  pregabalin (LYRICA) 150 MG capsule Take 150 mg by mouth 3 (three) times daily. 07/23/16 09/05/17 Yes [provider]  promethazine (PHENERGAN) 25 MG tablet Take 25 mg by mouth every 4 (four) hours as needed.  09/01/12  Yes [provider]  RABEprazole (ACIPHEX) 20 MG tablet Take 20 mg by mouth 2 (two) times daily before a meal.  09/10/12  Yes [provider]  Suvorexant (BELSOMRA) 20 MG TABS Take 20 mg by mouth daily.  03/28/15  Yes [provider]  tizanidine (ZANAFLEX) 6 MG capsule Take 6 mg by mouth 3 (three) times daily.   Yes [provider]  valACYclovir (VALTREX) 1000 MG tablet TAKE 2 TABS AT ONSET AND 2 TABS 12 HOURS LATER X1DAY FOR EACH FLARE 06/09/17  Yes [provider]  colestipol (COLESTID) 1 g tablet Take 1 g by mouth daily.  06/25/17   [provider]  glycopyrrolate (ROBINUL) 2 MG tablet Take 2 mg by mouth daily. 04/15/17   [provider]     Family History  Problem Relation Age of Onset  . Rheum arthritis Mother   . Lupus Mother   . Sjogren's syndrome Mother   . Lupus Father   . Cancer - Other Father   . Cancer - Other Brother        colon in 1 brother    Social History   Socioeconomic History  . Marital status: Significant Other    Spouse name: Brett Canales  .  Number of children: Not on file  . Years of education: Not on file  . Highest education level: Not on file  Occupational History  . Not on file  Social Needs  . Financial resource strain: Not on file  . Food insecurity:    Worry: Not on file    Inability: Not on file  . Transportation needs:    Medical: Not on file    Non-medical: Not on file  Tobacco Use  . Smoking status: Former Smoker    Packs/day: 1.00    Types: Cigarettes    Last attempt to quit: 09/21/2014    Years since quitting: 2.9  . Smokeless tobacco: Never Used  Substance and Sexual Activity  . Alcohol use: No  . Drug use: No  . Sexual activity: Yes  Lifestyle  . Physical activity:    Days per week: Not on file    Minutes per session: Not on file  . Stress: Not on file  Relationships  . Social connections:    Talks on phone: Not on file    Gets together: Not on file    Attends religious service: Not on file    Active member of club or organization: Not on file    Attends meetings of clubs or organizations: Not on file    Relationship status: Not on file  Other Topics Concern  . Not on file  Social History Narrative   PT lives at home.   Caffeine Use: 2-3 cups daily     Review of Systems: A 12 point ROS discussed and pertinent positives  are indicated in the HPI above.  All other systems are negative.  Review of Systems  Vital Signs: BP 117/89   Pulse 62   Temp 98.5 F (36.9 C) (Oral)   Resp 18   Ht 5\' 7"  (1.702 m)   Wt 162 lb (73.5 kg)   SpO2 96%   BMI 25.37 kg/m   Physical Exam  Constitutional: She is oriented to person, place, and time. She appears well-developed. No distress.  HENT:  Mouth/Throat: Oropharynx is clear and moist.  Neck: Normal range of motion. No JVD present.  Cardiovascular: Normal rate, regular rhythm and normal heart sounds.  Pulmonary/Chest: Effort normal and breath sounds normal. No respiratory distress.  Musculoskeletal:  Tender left paraspinous region  Neurological: She is oriented to person, place, and time.  Skin: She is not diaphoretic.  Psychiatric: She has a normal mood and affect. Judgment normal.    Imaging: Ct Chest W Contrast  Result Date: 09/06/2017 CLINICAL DATA:  Complicated pneumonia. Abscess seen on yesterday's MRI. EXAM: CT CHEST WITH CONTRAST TECHNIQUE: Multidetector CT imaging of the chest was performed during intravenous contrast administration. CONTRAST:  ISOVUE-300 IOPAMIDOL (ISOVUE-300) INJECTION 61% COMPARISON:  MRI thoracic and lumbar spine September 05, 2017 FINDINGS: CARDIOVASCULAR: Heart and pericardium are unremarkable. Thoracic aorta is normal course and caliber, unremarkable. MEDIASTINUM/NODES: No mediastinal mass. No lymphadenopathy by CT size criteria. Normal appearance of thoracic esophagus though not tailored for evaluation. LUNGS/PLEURA: Tracheobronchial tree is patent, no pneumothorax. Small LEFT pleural effusion with subpulmonic component. Lobulated pleural collection measuring to 2.1 cm corresponding to known abscess. UPPER ABDOMEN: Nonacute. 3 mm LEFT interpolar nephrolithiasis. 13 mm cyst LEFT lobe of the liver. MUSCULOSKELETAL: LEFT posterior nondisplaced T8 and T9 rib fractures with associated lytic component T9 (series 5, image 84). Mild  asymmetrically prominent LEFT paraspinal muscles without discrete fluid collection by CT though, demonstrated on yesterday's MRI. IMPRESSION: 1. Nondisplaced LEFT T8  and T9 fractures with lytic component consistent with pathologic fracture and osteomyelitis. Associated 2.1 cm focal pleural versus parenchymal abscess and small empyema better demonstrated on yesterday's MRI. 2. Mildly prominent LEFT paraspinal muscles corresponding to known abscess. 3. 3 mm nonobstructing LEFT nephrolithiasis. Electronically Signed   By: Awilda Metro [name]   On: 09/06/2017 04:08   Mr Thoracic Spine W Wo Contrast  Result Date: 09/06/2017 CLINICAL DATA:  Persistent fevers. History of spinal abscess and bony destruction. EXAM: MRI THORACIC AND LUMBAR SPINE WITHOUT AND WITH CONTRAST TECHNIQUE: Multiplanar and multiecho pulse sequences of the thoracic and lumbar spine were obtained without and with intravenous contrast. CONTRAST:  15mL MULTIHANCE GADOBENATE DIMEGLUMINE 529 MG/ML IV SOLN COMPARISON:  MRI thoracic spine Sep 29, 2012 and MRI lumbar spine May 09, 2011 FINDINGS: MRI THORACIC SPINE FINDINGS ALIGNMENT: Maintenance of the thoracic kyphosis. No malalignment. VERTEBRAE/DISCS: Vertebral bodies are intact. Intervertebral discs morphology and signal are normal. No abnormal disc enhancement. Mild bright STIR signal and enhancement bilateral T4-5 facets. LEFT ninth rib abnormal enhancement with suspected fracture. Associated 14 x 11 mm pleural to sub pleural fluid collection and, an adjacent 4 x 13 mm suspected empyema (axial 30/39). CORD: Thoracic spinal cord is normal morphology and signal characteristics. No abnormal cord, leptomeningeal or epidural enhancement. PREVERTEBRAL AND PARASPINAL SOFT TISSUES: Small LEFT pleural effusion. Mild LEFT lower thoracic paraspinal interstitial edema and enhancement. DISC LEVELS: No disc bulge, canal stenosis nor neural foraminal narrowing. MRI LUMBAR SPINE FINDINGS SEGMENTATION: For  the purposes of this report, the last well-formed intervertebral disc is reported as L5-S1. ALIGNMENT: Maintained lumbar lordosis. No malalignment. VERTEBRAE: Vertebral bodies are intact. Mild similar L5-S1 disc height loss, minimal L3-4 and L4-5 with disc desiccation and mild subacute discogenic endplate changes. No acute or abnormal bone marrow signal. No abnormal osseous or disc enhancement. No abnormal bone marrow signal. No abnormal osseous or intradiscal enhancement. CONUS MEDULLARIS AND CAUDA EQUINA: Conus medullaris terminates at L1-2 and demonstrates normal morphology and signal characteristics. Cauda equina is normal. No abnormal cord, leptomeningeal or epidural enhancement. PARASPINAL AND OTHER SOFT TISSUES: 1.1 x 4.5 x 7.9 cm (AP by transverse by CC) LEFT paraspinal subfascial abscess from T11-12 to L2, superficial to paraspinal muscles. DISC LEVELS: T12-L1, L1-2, L2-3: No disc bulge, canal stenosis nor neural foraminal narrowing. L3-4: Similar 4 mm broad-based disc bulge/central disc protrusion with enhancing annular fissure. No canal stenosis or neural foraminal narrowing. L4-5: Similar 5 mm broad-based disc bulge/LEFT central disc protrusion with enhancing annular fissure. Mild canal stenosis. No neural foraminal narrowing. L5-S1: Similar small central disc protrusion with 8 mm contiguous inferior migration and enhancement. Small broad-based disc bulge. Mild facet arthropathy without canal stenosis. Mild LEFT neural foraminal narrowing. IMPRESSION: MRI thoracic spine: 1. 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 Small LEFT pleural effusion and trace suspected empyema. Findings would be better demonstrated on CT chest with contrast. 2. LEFT paraspinal myositis. 3. Mild symmetric T4-5 facet edema favoring inflammatory changes, less likely infection/septic arthropathy. MRI lumbar spine: 1. 1.1 x 4.5 x 7.9 cm LEFT superficial paraspinal abscess. 2. No  discitis, osteomyelitis or epidural abscess. 3. Stable degenerative change of the lumbar spine. No canal stenosis. Mild LEFT L5-S1 neural foraminal narrowing. These results will be called to the ordering clinician or representative by the Radiologist Assistant, and communication documented in the PACS or zVision Dashboard. Electronically Signed   By: Awilda Metro [name]   On: 09/06/2017 01:38   Mr Lumbar  Spine W Wo Contrast  Result Date: 09/06/2017 CLINICAL DATA:  Persistent fevers. History of spinal abscess and bony destruction. EXAM: MRI THORACIC AND LUMBAR SPINE WITHOUT AND WITH CONTRAST TECHNIQUE: Multiplanar and multiecho pulse sequences of the thoracic and lumbar spine were obtained without and with intravenous contrast. CONTRAST:  15mL MULTIHANCE GADOBENATE DIMEGLUMINE 529 MG/ML IV SOLN COMPARISON:  MRI thoracic spine Sep 29, 2012 and MRI lumbar spine May 09, 2011 FINDINGS: MRI THORACIC SPINE FINDINGS ALIGNMENT: Maintenance of the thoracic kyphosis. No malalignment. VERTEBRAE/DISCS: Vertebral bodies are intact. Intervertebral discs morphology and signal are normal. No abnormal disc enhancement. Mild bright STIR signal and enhancement bilateral T4-5 facets. LEFT ninth rib abnormal enhancement with suspected fracture. Associated 14 x 11 mm pleural to sub pleural fluid collection and, an adjacent 4 x 13 mm suspected empyema (axial 30/39). CORD: Thoracic spinal cord is normal morphology and signal characteristics. No abnormal cord, leptomeningeal or epidural enhancement. PREVERTEBRAL AND PARASPINAL SOFT TISSUES: Small LEFT pleural effusion. Mild LEFT lower thoracic paraspinal interstitial edema and enhancement. DISC LEVELS: No disc bulge, canal stenosis nor neural foraminal narrowing. MRI LUMBAR SPINE FINDINGS SEGMENTATION: For the purposes of this report, the last well-formed intervertebral disc is reported as L5-S1. ALIGNMENT: Maintained lumbar lordosis. No malalignment. VERTEBRAE: Vertebral bodies  are intact. Mild similar L5-S1 disc height loss, minimal L3-4 and L4-5 with disc desiccation and mild subacute discogenic endplate changes. No acute or abnormal bone marrow signal. No abnormal osseous or disc enhancement. No abnormal bone marrow signal. No abnormal osseous or intradiscal enhancement. CONUS MEDULLARIS AND CAUDA EQUINA: Conus medullaris terminates at L1-2 and demonstrates normal morphology and signal characteristics. Cauda equina is normal. No abnormal cord, leptomeningeal or epidural enhancement. PARASPINAL AND OTHER SOFT TISSUES: 1.1 x 4.5 x 7.9 cm (AP by transverse by CC) LEFT paraspinal subfascial abscess from T11-12 to L2, superficial to paraspinal muscles. DISC LEVELS: T12-L1, L1-2, L2-3: No disc bulge, canal stenosis nor neural foraminal narrowing. L3-4: Similar 4 mm broad-based disc bulge/central disc protrusion with enhancing annular fissure. No canal stenosis or neural foraminal narrowing. L4-5: Similar 5 mm broad-based disc bulge/LEFT central disc protrusion with enhancing annular fissure. Mild canal stenosis. No neural foraminal narrowing. L5-S1: Similar small central disc protrusion with 8 mm contiguous inferior migration and enhancement. Small broad-based disc bulge. Mild facet arthropathy without canal stenosis. Mild LEFT neural foraminal narrowing. IMPRESSION: MRI thoracic spine: 1. 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 Small LEFT pleural effusion and trace suspected empyema. Findings would be better demonstrated on CT chest with contrast. 2. LEFT paraspinal myositis. 3. Mild symmetric T4-5 facet edema favoring inflammatory changes, less likely infection/septic arthropathy. MRI lumbar spine: 1. 1.1 x 4.5 x 7.9 cm LEFT superficial paraspinal abscess. 2. No discitis, osteomyelitis or epidural abscess. 3. Stable degenerative change of the lumbar spine. No canal stenosis. Mild LEFT L5-S1 neural foraminal narrowing. These results will  be called to the ordering clinician or representative by the Radiologist Assistant, and communication documented in the PACS or zVision Dashboard. Electronically Signed   By: Awilda Metro [name]   On: 09/06/2017 01:38    Labs:  CBC: Recent Labs    06/25/17 1106 09/05/17 1247 09/06/17 0501  WBC 7.8 9.8 9.8  HGB 14.8 13.7 12.0  HCT 43.5 41.4 37.5  PLT 187 340 305    COAGS: No results for input(s): INR, APTT in the last 8760 hours.  BMP: Recent Labs    06/25/17 1106 09/05/17 1247 09/06/17 0501  NA 142 142 137  K 4.5 5.4* 5.0  CL 104 106 101  CO2 21 28 24   GLUCOSE 120* 107* 96  BUN 20 14 20   CALCIUM 9.9 10.2 9.4  CREATININE 1.18* 0.83 0.78  GFRNONAA 55* >60 >60  GFRAA 63 >60 >60    LIVER FUNCTION TESTS: Recent Labs    06/25/17 1106 09/05/17 1247  BILITOT 0.2 0.4  AST 20 35  ALT 15 26  ALKPHOS 81 81  PROT 7.7 7.7  ALBUMIN 4.8 3.4*    TUMOR MARKERS: No results for input(s): AFPTM, CEA, CA199, CHROMGRNA in the last 8760 hours.  Assessment and Plan: Fever/back pain Left paraspinous abscess. Amenable to US guided aspiration. Will plan for that today. (L)9th rib osteo with bone destruction and possible associated small abscess/poss empyema.  Dr. Deanne Coffer reviewed and feels attempted needle placement to that site would be high risk for lung injury. Hopefully, this will respond to abx.  Risks and benefits discussed with the patient including bleeding, infection, and low yield tests.  All of the patient's questions were answered, patient is agreeable to proceed. Consent signed and in chart.    Thank you for this interesting consult.  I greatly enjoyed meeting [name] and look forward to participating in their care.  A copy of this report was sent to the requesting provider on this date.  Electronically Signed: Brayton El, PA-C 09/06/2017, 12:07 PM   I spent a total of 20 in face to face in clinical consultation, greater than 50% of which  was counseling/coordinating care for paraspinous abscess aspiration

## 2017-09-06 NOTE — ED Notes (Signed)
Attempted report x1. 

## 2017-09-06 NOTE — Progress Notes (Signed)
[facility]  [facility] Infusion Coordinator will follow pt with ID team to support home IV ABX at DC if needed.  If patient discharges after hours, please call [phone].   Sedalia Muta 09/06/2017, 5:04 PM

## 2017-09-06 NOTE — Progress Notes (Signed)
[facility]  [name] Infusion Coordinator will follow pt with ID team to support home infusion pharmacy services if long term IV ABX are needed at DC.  If patient discharges after hours, please call [phone].   [name] 09/06/2017, 9:43 AM

## 2017-09-06 NOTE — ED Notes (Signed)
Pt transported to IR 

## 2017-09-06 NOTE — ED Notes (Signed)
Pt given towels, washrag and body wash

## 2017-09-06 NOTE — Sedation Documentation (Signed)
Patient is resting comfortably. 

## 2017-09-07 ENCOUNTER — Inpatient Hospital Stay (HOSPITAL_COMMUNITY): Payer: Medicare Other

## 2017-09-07 DIAGNOSIS — G8929 Other chronic pain: Secondary | ICD-10-CM

## 2017-09-07 DIAGNOSIS — L02213 Cutaneous abscess of chest wall: Secondary | ICD-10-CM

## 2017-09-07 DIAGNOSIS — G061 Intraspinal abscess and granuloma: Secondary | ICD-10-CM

## 2017-09-07 DIAGNOSIS — G35 Multiple sclerosis: Secondary | ICD-10-CM

## 2017-09-07 DIAGNOSIS — B9689 Other specified bacterial agents as the cause of diseases classified elsewhere: Secondary | ICD-10-CM

## 2017-09-07 DIAGNOSIS — R509 Fever, unspecified: Secondary | ICD-10-CM

## 2017-09-07 DIAGNOSIS — M4620 Osteomyelitis of vertebra, site unspecified: Secondary | ICD-10-CM

## 2017-09-07 DIAGNOSIS — R0781 Pleurodynia: Secondary | ICD-10-CM

## 2017-09-07 LAB — HEPATITIS PANEL, ACUTE
HCV Ab: <0.1 s/co ratio (ref 0.0–0.9)
HEP A IGM: NEGATIVE
HEP B C IGM: NEGATIVE
Hepatitis B Surface Ag: NEGATIVE

## 2017-09-07 LAB — ECHOCARDIOGRAM COMPLETE
Height: 67 in
Weight: 2807.78 oz

## 2017-09-07 NOTE — Progress Notes (Signed)
Subjective:  I have pain in her left rib cage   Antibiotics:  Anti-infectives (From admission, onward)   Start     Dose/Rate Route Frequency Ordered Stop   09/07/17 0500  vancomycin (VANCOCIN) IVPB 1000 mg/200 mL premix     1,000 mg 200 mL/hr over 60 Minutes Intravenous Every 12 hours 09/06/17 1616     09/06/17 1700  cefTRIAXone (ROCEPHIN) 2 g in sodium chloride 0.9 % 100 mL IVPB     2 g 200 mL/hr over 30 Minutes Intravenous Every 24 hours 09/06/17 1603     09/06/17 1700  vancomycin (VANCOCIN) 1,250 mg in sodium chloride 0.9 % 250 mL IVPB     1,250 mg 166.7 mL/hr over 90 Minutes Intravenous  Once 09/06/17 1616 09/06/17 1949      Medications: Scheduled Meds: . amphetamine-dextroamphetamine  30 mg Oral BID  . baclofen  10 mg Oral TID  . Brexpiprazole  2 mg Oral Daily  . busPIRone  10 mg Oral TID  . DULoxetine  30 mg Oral QHS  . DULoxetine  60 mg Oral Daily  . levETIRAcetam  1,500 mg Oral BID  . levothyroxine  100 mcg Oral QAC breakfast  . liothyronine  5 mcg Oral Daily  . magnesium oxide  400 mg Oral Daily  . multivitamin with minerals  1 tablet Oral Daily  . olopatadine  1 drop Both Eyes BID  . pantoprazole  40 mg Oral BID  . pregabalin  150 mg Oral TID  . sodium chloride flush  3 mL Intravenous Q12H  . Suvorexant  20 mg Oral QHS  . tiZANidine  6 mg Oral TID   Continuous Infusions: . sodium chloride    . cefTRIAXone (ROCEPHIN)  IV Stopped (09/06/17 1712)  . vancomycin Stopped (09/07/17 0555)   PRN Meds:.sodium chloride, acetaminophen **OR** acetaminophen, albuterol, bisacodyl, HYDROcodone-acetaminophen, HYDROmorphone (DILAUDID) injection, ondansetron **OR** ondansetron (ZOFRAN) IV, oxyCODONE-acetaminophen **AND** oxyCODONE, senna-docusate, sodium chloride flush, sodium phosphate    Objective: Weight change: 13 lb 7.8 oz (6.117 kg)  Intake/Output Summary (Last 24 hours) at 09/07/2017 1011 Last data filed at 09/07/2017 0937 Gross per 24 hour  Intake 700  ml  Output 500 ml  Net 200 ml   Blood pressure 116/81, pulse 60, temperature 98.1 F (36.7 C), temperature source Oral, resp. rate 19, height 5\' 7"  (1.702 m), weight 175 lb 7.8 oz (79.6 kg), SpO2 97 %. Temp:  [98.1 F (36.7 C)-98.8 F (37.1 C)] 98.1 F (36.7 C) (04/27 0534) Pulse Rate:  [57-70] 60 (04/27 0534) Resp:  [16-20] 19 (04/27 0534) BP: (99-119)/(60-89) 116/81 (04/27 0534) SpO2:  [95 %-100 %] 97 % (04/27 0534) Weight:  [175 lb 7.8 oz (79.6 kg)] 175 lb 7.8 oz (79.6 kg) (04/27 0534)  Physical Exam: General: Alert and awake, oriented x3, not in any acute distress. HEENT: anicteric sclera, pupils reactive to light and accommodation, EOMI CVS regular rate, normal r,  no murmur rubs or gallops Chest: clear to auscultation bilaterally, no wheezing, rales or rhonchi Abdomen: soft nontender, nondistended, normal bowel sounds, Extremities: no  clubbing or edema noted bilaterally Skin: no rashes Lymph: no new lymphadenopathy Neuro: nonfocal  CBC: @LABBLAST3 (wbc3,Hgb:3,Hct:3,Plt:3,INR:3APTT:3)@   BMET Recent Labs    09/05/17 1247 09/06/17 0501  NA 142 137  K 5.4* 5.0  CL 106 101  CO2 28 24  GLUCOSE 107* 96  BUN 14 20  CREATININE 0.83 0.78  CALCIUM 10.2 9.4     Liver Panel  Recent Labs  09/05/17 1247  PROT 7.7  ALBUMIN 3.4*  AST 35  ALT 26  ALKPHOS 81  BILITOT 0.4       Sedimentation Rate No results for input(s): ESRSEDRATE in the last 72 hours. C-Reactive Protein No results for input(s): CRP in the last 72 hours.  Micro Results: Recent Results (from the past 720 hour(s))  Aerobic/Anaerobic Culture (surgical/deep wound)     Status: None (Preliminary result)   Collection Time: 09/06/17  2:48 PM  Result Value Ref Range Status   Specimen Description ABSCESS  Final   Special Requests PARAVETEBRAL  Final   Gram Stain   Final    RARE WBC PRESENT,BOTH PMN AND MONONUCLEAR RARE GRAM POSITIVE COCCI Performed at [facility], [address],  [place], [place] [address]    Culture PENDING  Incomplete   Report Status PENDING  Incomplete    Studies/Results: Ct Chest W Contrast  Result Date: 09/06/2017 CLINICAL DATA:  Complicated pneumonia. Abscess seen on yesterday's MRI. EXAM: CT CHEST WITH CONTRAST TECHNIQUE: Multidetector CT imaging of the chest was performed during intravenous contrast administration. CONTRAST:  ISOVUE-300 IOPAMIDOL (ISOVUE-300) INJECTION 61% COMPARISON:  MRI thoracic and lumbar spine September 05, 2017 FINDINGS: CARDIOVASCULAR: Heart and pericardium are unremarkable. Thoracic aorta is normal course and caliber, unremarkable. MEDIASTINUM/NODES: No mediastinal mass. No lymphadenopathy by CT size criteria. Normal appearance of thoracic esophagus though not tailored for evaluation. LUNGS/PLEURA: Tracheobronchial tree is patent, no pneumothorax. Small LEFT pleural effusion with subpulmonic component. Lobulated pleural collection measuring to 2.1 cm corresponding to known abscess. UPPER ABDOMEN: Nonacute. 3 mm LEFT interpolar nephrolithiasis. 13 mm cyst LEFT lobe of the liver. MUSCULOSKELETAL: LEFT posterior nondisplaced T8 and T9 rib fractures with associated lytic component T9 (series 5, image 84). Mild asymmetrically prominent LEFT paraspinal muscles without discrete fluid collection by CT though, demonstrated on yesterday's MRI. IMPRESSION: 1. Nondisplaced LEFT T8 and T9 fractures with lytic component consistent with pathologic fracture and osteomyelitis. Associated 2.1 cm focal pleural versus parenchymal abscess and small empyema better demonstrated on yesterday's MRI. 2. Mildly prominent LEFT paraspinal muscles corresponding to known abscess. 3. 3 mm nonobstructing LEFT nephrolithiasis. Electronically Signed   By: Awilda Metro [name]   On: 09/06/2017 04:08   Mr Thoracic Spine W Wo Contrast  Result Date: 09/06/2017 CLINICAL DATA:  Persistent fevers. History of spinal abscess and bony destruction. EXAM: MRI THORACIC AND  LUMBAR SPINE WITHOUT AND WITH CONTRAST TECHNIQUE: Multiplanar and multiecho pulse sequences of the thoracic and lumbar spine were obtained without and with intravenous contrast. CONTRAST:  15mL MULTIHANCE GADOBENATE DIMEGLUMINE 529 MG/ML IV SOLN COMPARISON:  MRI thoracic spine Sep 29, 2012 and MRI lumbar spine May 09, 2011 FINDINGS: MRI THORACIC SPINE FINDINGS ALIGNMENT: Maintenance of the thoracic kyphosis. No malalignment. VERTEBRAE/DISCS: Vertebral bodies are intact. Intervertebral discs morphology and signal are normal. No abnormal disc enhancement. Mild bright STIR signal and enhancement bilateral T4-5 facets. LEFT ninth rib abnormal enhancement with suspected fracture. Associated 14 x 11 mm pleural to sub pleural fluid collection and, an adjacent 4 x 13 mm suspected empyema (axial 30/39). CORD: Thoracic spinal cord is normal morphology and signal characteristics. No abnormal cord, leptomeningeal or epidural enhancement. PREVERTEBRAL AND PARASPINAL SOFT TISSUES: Small LEFT pleural effusion. Mild LEFT lower thoracic paraspinal interstitial edema and enhancement. DISC LEVELS: No disc bulge, canal stenosis nor neural foraminal narrowing. MRI LUMBAR SPINE FINDINGS SEGMENTATION: For the purposes of this report, the last well-formed intervertebral disc is reported as L5-S1. ALIGNMENT: Maintained lumbar lordosis. No malalignment.  VERTEBRAE: Vertebral bodies are intact. Mild similar L5-S1 disc height loss, minimal L3-4 and L4-5 with disc desiccation and mild subacute discogenic endplate changes. No acute or abnormal bone marrow signal. No abnormal osseous or disc enhancement. No abnormal bone marrow signal. No abnormal osseous or intradiscal enhancement. CONUS MEDULLARIS AND CAUDA EQUINA: Conus medullaris terminates at L1-2 and demonstrates normal morphology and signal characteristics. Cauda equina is normal. No abnormal cord, leptomeningeal or epidural enhancement. PARASPINAL AND OTHER SOFT TISSUES: 1.1 x 4.5 x  7.9 cm (AP by transverse by CC) LEFT paraspinal subfascial abscess from T11-12 to L2, superficial to paraspinal muscles. DISC LEVELS: T12-L1, L1-2, L2-3: No disc bulge, canal stenosis nor neural foraminal narrowing. L3-4: Similar 4 mm broad-based disc bulge/central disc protrusion with enhancing annular fissure. No canal stenosis or neural foraminal narrowing. L4-5: Similar 5 mm broad-based disc bulge/LEFT central disc protrusion with enhancing annular fissure. Mild canal stenosis. No neural foraminal narrowing. L5-S1: Similar small central disc protrusion with 8 mm contiguous inferior migration and enhancement. Small broad-based disc bulge. Mild facet arthropathy without canal stenosis. Mild LEFT neural foraminal narrowing. IMPRESSION: MRI thoracic spine: 1. 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 Small LEFT pleural effusion and trace suspected empyema. Findings would be better demonstrated on CT chest with contrast. 2. LEFT paraspinal myositis. 3. Mild symmetric T4-5 facet edema favoring inflammatory changes, less likely infection/septic arthropathy. MRI lumbar spine: 1. 1.1 x 4.5 x 7.9 cm LEFT superficial paraspinal abscess. 2. No discitis, osteomyelitis or epidural abscess. 3. Stable degenerative change of the lumbar spine. No canal stenosis. Mild LEFT L5-S1 neural foraminal narrowing. These results will be called to the ordering clinician or representative by the Radiologist Assistant, and communication documented in the PACS or zVision Dashboard. Electronically Signed   By: Awilda Metro [name]   On: 09/06/2017 01:38   Mr Lumbar Spine W Wo Contrast  Result Date: 09/06/2017 CLINICAL DATA:  Persistent fevers. History of spinal abscess and bony destruction. EXAM: MRI THORACIC AND LUMBAR SPINE WITHOUT AND WITH CONTRAST TECHNIQUE: Multiplanar and multiecho pulse sequences of the thoracic and lumbar spine were obtained without and with intravenous  contrast. CONTRAST:  15mL MULTIHANCE GADOBENATE DIMEGLUMINE 529 MG/ML IV SOLN COMPARISON:  MRI thoracic spine Sep 29, 2012 and MRI lumbar spine May 09, 2011 FINDINGS: MRI THORACIC SPINE FINDINGS ALIGNMENT: Maintenance of the thoracic kyphosis. No malalignment. VERTEBRAE/DISCS: Vertebral bodies are intact. Intervertebral discs morphology and signal are normal. No abnormal disc enhancement. Mild bright STIR signal and enhancement bilateral T4-5 facets. LEFT ninth rib abnormal enhancement with suspected fracture. Associated 14 x 11 mm pleural to sub pleural fluid collection and, an adjacent 4 x 13 mm suspected empyema (axial 30/39). CORD: Thoracic spinal cord is normal morphology and signal characteristics. No abnormal cord, leptomeningeal or epidural enhancement. PREVERTEBRAL AND PARASPINAL SOFT TISSUES: Small LEFT pleural effusion. Mild LEFT lower thoracic paraspinal interstitial edema and enhancement. DISC LEVELS: No disc bulge, canal stenosis nor neural foraminal narrowing. MRI LUMBAR SPINE FINDINGS SEGMENTATION: For the purposes of this report, the last well-formed intervertebral disc is reported as L5-S1. ALIGNMENT: Maintained lumbar lordosis. No malalignment. VERTEBRAE: Vertebral bodies are intact. Mild similar L5-S1 disc height loss, minimal L3-4 and L4-5 with disc desiccation and mild subacute discogenic endplate changes. No acute or abnormal bone marrow signal. No abnormal osseous or disc enhancement. No abnormal bone marrow signal. No abnormal osseous or intradiscal enhancement. CONUS MEDULLARIS AND CAUDA EQUINA: Conus medullaris terminates at L1-2 and demonstrates normal morphology  and signal characteristics. Cauda equina is normal. No abnormal cord, leptomeningeal or epidural enhancement. PARASPINAL AND OTHER SOFT TISSUES: 1.1 x 4.5 x 7.9 cm (AP by transverse by CC) LEFT paraspinal subfascial abscess from T11-12 to L2, superficial to paraspinal muscles. DISC LEVELS: T12-L1, L1-2, L2-3: No disc bulge,  canal stenosis nor neural foraminal narrowing. L3-4: Similar 4 mm broad-based disc bulge/central disc protrusion with enhancing annular fissure. No canal stenosis or neural foraminal narrowing. L4-5: Similar 5 mm broad-based disc bulge/LEFT central disc protrusion with enhancing annular fissure. Mild canal stenosis. No neural foraminal narrowing. L5-S1: Similar small central disc protrusion with 8 mm contiguous inferior migration and enhancement. Small broad-based disc bulge. Mild facet arthropathy without canal stenosis. Mild LEFT neural foraminal narrowing. IMPRESSION: MRI thoracic spine: 1. 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 Small LEFT pleural effusion and trace suspected empyema. Findings would be better demonstrated on CT chest with contrast. 2. LEFT paraspinal myositis. 3. Mild symmetric T4-5 facet edema favoring inflammatory changes, less likely infection/septic arthropathy. MRI lumbar spine: 1. 1.1 x 4.5 x 7.9 cm LEFT superficial paraspinal abscess. 2. No discitis, osteomyelitis or epidural abscess. 3. Stable degenerative change of the lumbar spine. No canal stenosis. Mild LEFT L5-S1 neural foraminal narrowing. These results will be called to the ordering clinician or representative by the Radiologist Assistant, and communication documented in the PACS or zVision Dashboard. Electronically Signed   By: Awilda Metro [name]   On: 09/06/2017 01:38   US Guided Needle Placement  Result Date: 09/06/2017 CLINICAL DATA:  History of spinal infection. Recent MR demonstrates left superficial paraspinal abscess. EXAM: IR ULTRASOUND GUIDED ASPIRATION COMPARISON:  MR 09/05/2017 TECHNIQUE: The procedure, risks (including but not limited to bleeding, infection, organ damage ), benefits, and alternatives were explained to the patient. Questions regarding the procedure were encouraged and answered. The patient understands and consents to the procedure.  Intravenous Fentanyl and Versed were administered as conscious sedation during continuous monitoring of the patient's level of consciousness and physiological / cardiorespiratory status by the radiology RN, with a total moderate sedation time of 10 minutes. Survey ultrasound of the left paraspinal region was performed and the linear fluid collection was localized. Site was marked, prepped with chlorhexidine, draped in usual sterile fashion, infiltrated locally with 1% lidocaine. Under real-time ultrasound guidance, an 18 gauge spinal needle was advanced into the collection. Approximately 1 mL of turbid serosanguineous fluid was aspirated, sent for Gram stain and culture. Postprocedure scans show no hemorrhage or other apparent complication. The patient tolerated the procedure well. IMPRESSION: 1. Technically successful aspiration of left superficial lumbar paraspinal abscess, sample sent for Gram stain and culture. Electronically Signed   By: Corlis Leak [name]   On: 09/06/2017 15:48      Assessment/Plan:  INTERVAL HISTORY: Gram stain is showing gram-positive cocci   Principal Problem:   Paraspinal abscess (HCC) Active Problems:   Multiple sclerosis (HCC)   Depression with anxiety   Hypothyroidism   Chronic pain    Nailyn Dearinger is a 47 y.o. female with multiple sclerosis chronic pain, who is been found to have a paraspinal abscess and a left ninth rib abscess with osteomyelitis.  Aspirate from the right paraspinal abscesses revealed a gram-positive cocci  1.  Paraspinal abscess and also left rib abscess and osteomyelitis.  Continue IV vancomycin and ceftriaxone  Follow cultures  Patient will need protracted therapy.  She does have a history of problems with chronic pain the patient herself  denies any history of intravenous drug use.   If so we can send her home with an IV to complete at least 6 if not 8 weeks of parenteral antibiotics potentially followed by oral antibiotics  depending on the organism and depending on her response to therapy.     LOS: 2 days   Acey Lav 09/07/2017, 10:11 AM

## 2017-09-07 NOTE — Progress Notes (Signed)
PROGRESS NOTE    [name]  ZOX:[id number] DOB: 01-13-1971 DOA: 09/05/2017 PCP: Primus Bravo, NP   Brief Narrative:  47 year old with past medical history relevant for relapsing remitting multiple sclerosis, chronic low back pain, Hypothyroidism, seizure disorder, who came in for acute on chronic severe back pain and found to have large paraspinal fluid collection and left rib osteomyelitis/abscess..   Assessment & Plan:   Principal Problem:   Paraspinal abscess (HCC) Active Problems:   Multiple sclerosis (HCC)   Depression with anxiety   Hypothyroidism   Chronic pain   #) Paraspinal fluid collection/left rib still mellitus/abscess: IR drain fluid collection of approximately 1 mL that showed rare GPC's.  Unclear etiology however patient denies any recreational substance use. -ID following appreciate recommendations - Culture from fluid collection on 09/06/2017 no growth to date -Continue IV vancomycin and ceftriaxone on 09/06/2017, will need prolonged antibiotic course parenterally per ID recommendations -Echo pending  #) Hypothyroidism: -Continue levothyroxine 100 mcg, must be brand only -Continue liothyronine 5 mcg daily  #) Seizure disorder: - Continue levetiracetam 1500 mg twice daily  #) Relapsing remitting multiple sclerosis: - Hold ocrelizumab infusion -Continue baclofen 10 mg 3 times daily -Continue oxybutynin 5 mg nightly  #) Chronic diarrhea: -Continue colestipol 1 g daily -Continue Lomotil as needed  #) Pain/psych: - Continue brexpiprazole 2 mg daily -Continue buspirone 10 mg 3 times daily -Continue celecoxib 100 mg twice daily -Continue duloxetine 30 mg nightly and 60 mg daily -Continue glycopyrrolate 2 mg daily -Hold intranasal ketorolac - Continue as needed Percocet -Continue 3 times daily pregabalin 150 mg - Continue suvorexant 20 mg daily -Continue tizanidine 6 mg 3 times daily  Fluids: Tolerating p.o. Electrolytes: Monitor and  supplement Nutrition: Regular diet  Prophylaxis: SCDs  Disposition: Pending drainage of fluid and antibiotic plan  Full code  Consultants:   Infectious disease  Interventional radiology  Neurosurgery  Procedures: (Don't include imaging studies which can be auto populated. Include things that cannot be auto populated i.e. Echo, Carotid and venous dopplers, Foley, Bipap, HD, tubes/drains, wound vac, central lines etc)  IR guided drainage of paraspinal abscess on 09/06/2017  Echo 09/07/2017: Pending  Antimicrobials: (specify start and planned stop date. Auto populated tables are space occupying and do not give end dates)  IV ceftriaxone and vancomycin started 09/06/2017   Subjective: Patient reports she is doing somewhat better.  She reports her back pain is fairly severe.  She denies any nausea, vomiting, cough, congestion, fevers.  Objective: Vitals:   09/06/17 2132 09/07/17 0239 09/07/17 0534 09/07/17 1314  BP: 103/77 116/74 116/81 122/85  Pulse: (!) 57 60 60 69  Resp: 18 19 19 18   Temp: 98.1 F (36.7 C) 98.8 F (37.1 C) 98.1 F (36.7 C) 98.1 F (36.7 C)  TempSrc: Oral  Oral Oral  SpO2: 95% 96% 97% 97%  Weight:   79.6 kg (175 lb 7.8 oz)   Height:        Intake/Output Summary (Last 24 hours) at 09/07/2017 1433 Last data filed at 09/07/2017 0937 Gross per 24 hour  Intake 700 ml  Output 500 ml  Net 200 ml   Filed Weights   09/05/17 1215 09/07/17 0534  Weight: 73.5 kg (162 lb) 79.6 kg (175 lb 7.8 oz)    Examination:  General exam: Appears calm and comfortable  Respiratory system: Clear to auscultation. Respiratory effort normal. Cardiovascular system: Regular rate and rhythm, no murmurs Gastrointestinal system: Soft, nondistended, plus bowel sounds, no rebound or guarding Central nervous system:  Alert and oriented. No focal neurological deficits. Extremities: No lower extremity edema Back: Significant amounts of pain and tenderness throughout spine worsening  thoracic area, limited range of motion of the spine Skin: No rashes on visible skin Psychiatry: Judgement and insight appear normal. Mood & affect appropriate.     Data Reviewed: I have personally reviewed following labs and imaging studies  CBC: Recent Labs  Lab 09/05/17 1247 09/06/17 0501  WBC 9.8 9.8  NEUTROABS 7.3 6.3  HGB 13.7 12.0  HCT 41.4 37.5  MCV 100.7* 101.4*  PLT 340 305   Basic Metabolic Panel: Recent Labs  Lab 09/05/17 1247 09/06/17 0501  NA 142 137  K 5.4* 5.0  CL 106 101  CO2 28 24  GLUCOSE 107* 96  BUN 14 20  CREATININE 0.83 0.78  CALCIUM 10.2 9.4   GFR: Estimated Creatinine Clearance: 93.4 mL/min (by C-G formula based on SCr of 0.78 mg/dL). Liver Function Tests: Recent Labs  Lab 09/05/17 1247  AST 35  ALT 26  ALKPHOS 81  BILITOT 0.4  PROT 7.7  ALBUMIN 3.4*   No results for input(s): LIPASE, AMYLASE in the last 168 hours. No results for input(s): AMMONIA in the last 168 hours. Coagulation Profile: No results for input(s): INR, PROTIME in the last 168 hours. Cardiac Enzymes: No results for input(s): CKTOTAL, CKMB, CKMBINDEX, TROPONINI in the last 168 hours. BNP (last 3 results) No results for input(s): PROBNP in the last 8760 hours. HbA1C: No results for input(s): HGBA1C in the last 72 hours. CBG: No results for input(s): GLUCAP in the last 168 hours. Lipid Profile: No results for input(s): CHOL, HDL, LDLCALC, TRIG, CHOLHDL, LDLDIRECT in the last 72 hours. Thyroid Function Tests: No results for input(s): TSH, T4TOTAL, FREET4, T3FREE, THYROIDAB in the last 72 hours. Anemia Panel: No results for input(s): VITAMINB12, FOLATE, FERRITIN, TIBC, IRON, RETICCTPCT in the last 72 hours. Sepsis Labs: Recent Labs  Lab 09/05/17 1336  LATICACIDVEN 1.23    Recent Results (from the past 240 hour(s))  Aerobic/Anaerobic Culture (surgical/deep wound)     Status: None (Preliminary result)   Collection Time: 09/06/17  2:48 PM  Result Value Ref  Range Status   Specimen Description ABSCESS  Final   Special Requests PARAVETEBRAL  Final   Gram Stain   Final    RARE WBC PRESENT,BOTH PMN AND MONONUCLEAR RARE GRAM POSITIVE COCCI    Culture   Final    NO GROWTH 1 DAY Performed at [facility], [address], [place], [place] [address]    Report Status PENDING  Incomplete         Radiology Studies: Ct Chest W Contrast  Result Date: 09/06/2017 CLINICAL DATA:  Complicated pneumonia. Abscess seen on yesterday's MRI. EXAM: CT CHEST WITH CONTRAST TECHNIQUE: Multidetector CT imaging of the chest was performed during intravenous contrast administration. CONTRAST:  ISOVUE-300 IOPAMIDOL (ISOVUE-300) INJECTION 61% COMPARISON:  MRI thoracic and lumbar spine September 05, 2017 FINDINGS: CARDIOVASCULAR: Heart and pericardium are unremarkable. Thoracic aorta is normal course and caliber, unremarkable. MEDIASTINUM/NODES: No mediastinal mass. No lymphadenopathy by CT size criteria. Normal appearance of thoracic esophagus though not tailored for evaluation. LUNGS/PLEURA: Tracheobronchial tree is patent, no pneumothorax. Small LEFT pleural effusion with subpulmonic component. Lobulated pleural collection measuring to 2.1 cm corresponding to known abscess. UPPER ABDOMEN: Nonacute. 3 mm LEFT interpolar nephrolithiasis. 13 mm cyst LEFT lobe of the liver. MUSCULOSKELETAL: LEFT posterior nondisplaced T8 and T9 rib fractures with associated lytic component T9 (series 5, image 84).  Mild asymmetrically prominent LEFT paraspinal muscles without discrete fluid collection by CT though, demonstrated on yesterday's MRI. IMPRESSION: 1. Nondisplaced LEFT T8 and T9 fractures with lytic component consistent with pathologic fracture and osteomyelitis. Associated 2.1 cm focal pleural versus parenchymal abscess and small empyema better demonstrated on yesterday's MRI. 2. Mildly prominent LEFT paraspinal muscles corresponding to known abscess. 3. 3 mm nonobstructing LEFT  nephrolithiasis. Electronically Signed   By: Awilda Metro [name]   On: 09/06/2017 04:08   Mr Thoracic Spine W Wo Contrast  Result Date: 09/06/2017 CLINICAL DATA:  Persistent fevers. History of spinal abscess and bony destruction. EXAM: MRI THORACIC AND LUMBAR SPINE WITHOUT AND WITH CONTRAST TECHNIQUE: Multiplanar and multiecho pulse sequences of the thoracic and lumbar spine were obtained without and with intravenous contrast. CONTRAST:  34mL MULTIHANCE GADOBENATE DIMEGLUMINE 529 MG/ML IV SOLN COMPARISON:  MRI thoracic spine Sep 29, 2012 and MRI lumbar spine May 09, 2011 FINDINGS: MRI THORACIC SPINE FINDINGS ALIGNMENT: Maintenance of the thoracic kyphosis. No malalignment. VERTEBRAE/DISCS: Vertebral bodies are intact. Intervertebral discs morphology and signal are normal. No abnormal disc enhancement. Mild bright STIR signal and enhancement bilateral T4-5 facets. LEFT ninth rib abnormal enhancement with suspected fracture. Associated 14 x 11 mm pleural to sub pleural fluid collection and, an adjacent 4 x 13 mm suspected empyema (axial 30/39). CORD: Thoracic spinal cord is normal morphology and signal characteristics. No abnormal cord, leptomeningeal or epidural enhancement. PREVERTEBRAL AND PARASPINAL SOFT TISSUES: Small LEFT pleural effusion. Mild LEFT lower thoracic paraspinal interstitial edema and enhancement. DISC LEVELS: No disc bulge, canal stenosis nor neural foraminal narrowing. MRI LUMBAR SPINE FINDINGS SEGMENTATION: For the purposes of this report, the last well-formed intervertebral disc is reported as L5-S1. ALIGNMENT: Maintained lumbar lordosis. No malalignment. VERTEBRAE: Vertebral bodies are intact. Mild similar L5-S1 disc height loss, minimal L3-4 and L4-5 with disc desiccation and mild subacute discogenic endplate changes. No acute or abnormal bone marrow signal. No abnormal osseous or disc enhancement. No abnormal bone marrow signal. No abnormal osseous or intradiscal enhancement.  CONUS MEDULLARIS AND CAUDA EQUINA: Conus medullaris terminates at L1-2 and demonstrates normal morphology and signal characteristics. Cauda equina is normal. No abnormal cord, leptomeningeal or epidural enhancement. PARASPINAL AND OTHER SOFT TISSUES: 1.1 x 4.5 x 7.9 cm (AP by transverse by CC) LEFT paraspinal subfascial abscess from T11-12 to L2, superficial to paraspinal muscles. DISC LEVELS: T12-L1, L1-2, L2-3: No disc bulge, canal stenosis nor neural foraminal narrowing. L3-4: Similar 4 mm broad-based disc bulge/central disc protrusion with enhancing annular fissure. No canal stenosis or neural foraminal narrowing. L4-5: Similar 5 mm broad-based disc bulge/LEFT central disc protrusion with enhancing annular fissure. Mild canal stenosis. No neural foraminal narrowing. L5-S1: Similar small central disc protrusion with 8 mm contiguous inferior migration and enhancement. Small broad-based disc bulge. Mild facet arthropathy without canal stenosis. Mild LEFT neural foraminal narrowing. IMPRESSION: MRI thoracic spine: 1. 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 Small LEFT pleural effusion and trace suspected empyema. Findings would be better demonstrated on CT chest with contrast. 2. LEFT paraspinal myositis. 3. Mild symmetric T4-5 facet edema favoring inflammatory changes, less likely infection/septic arthropathy. MRI lumbar spine: 1. 1.1 x 4.5 x 7.9 cm LEFT superficial paraspinal abscess. 2. No discitis, osteomyelitis or epidural abscess. 3. Stable degenerative change of the lumbar spine. No canal stenosis. Mild LEFT L5-S1 neural foraminal narrowing. These results will be called to the ordering clinician or representative by the Radiologist Assistant, and communication documented in the  PACS or zVision Dashboard. Electronically Signed   By: Awilda Metro [name]   On: 09/06/2017 01:38   Mr Lumbar Spine W Wo Contrast  Result Date: 09/06/2017 CLINICAL DATA:   Persistent fevers. History of spinal abscess and bony destruction. EXAM: MRI THORACIC AND LUMBAR SPINE WITHOUT AND WITH CONTRAST TECHNIQUE: Multiplanar and multiecho pulse sequences of the thoracic and lumbar spine were obtained without and with intravenous contrast. CONTRAST:  15mL MULTIHANCE GADOBENATE DIMEGLUMINE 529 MG/ML IV SOLN COMPARISON:  MRI thoracic spine Sep 29, 2012 and MRI lumbar spine May 09, 2011 FINDINGS: MRI THORACIC SPINE FINDINGS ALIGNMENT: Maintenance of the thoracic kyphosis. No malalignment. VERTEBRAE/DISCS: Vertebral bodies are intact. Intervertebral discs morphology and signal are normal. No abnormal disc enhancement. Mild bright STIR signal and enhancement bilateral T4-5 facets. LEFT ninth rib abnormal enhancement with suspected fracture. Associated 14 x 11 mm pleural to sub pleural fluid collection and, an adjacent 4 x 13 mm suspected empyema (axial 30/39). CORD: Thoracic spinal cord is normal morphology and signal characteristics. No abnormal cord, leptomeningeal or epidural enhancement. PREVERTEBRAL AND PARASPINAL SOFT TISSUES: Small LEFT pleural effusion. Mild LEFT lower thoracic paraspinal interstitial edema and enhancement. DISC LEVELS: No disc bulge, canal stenosis nor neural foraminal narrowing. MRI LUMBAR SPINE FINDINGS SEGMENTATION: For the purposes of this report, the last well-formed intervertebral disc is reported as L5-S1. ALIGNMENT: Maintained lumbar lordosis. No malalignment. VERTEBRAE: Vertebral bodies are intact. Mild similar L5-S1 disc height loss, minimal L3-4 and L4-5 with disc desiccation and mild subacute discogenic endplate changes. No acute or abnormal bone marrow signal. No abnormal osseous or disc enhancement. No abnormal bone marrow signal. No abnormal osseous or intradiscal enhancement. CONUS MEDULLARIS AND CAUDA EQUINA: Conus medullaris terminates at L1-2 and demonstrates normal morphology and signal characteristics. Cauda equina is normal. No abnormal  cord, leptomeningeal or epidural enhancement. PARASPINAL AND OTHER SOFT TISSUES: 1.1 x 4.5 x 7.9 cm (AP by transverse by CC) LEFT paraspinal subfascial abscess from T11-12 to L2, superficial to paraspinal muscles. DISC LEVELS: T12-L1, L1-2, L2-3: No disc bulge, canal stenosis nor neural foraminal narrowing. L3-4: Similar 4 mm broad-based disc bulge/central disc protrusion with enhancing annular fissure. No canal stenosis or neural foraminal narrowing. L4-5: Similar 5 mm broad-based disc bulge/LEFT central disc protrusion with enhancing annular fissure. Mild canal stenosis. No neural foraminal narrowing. L5-S1: Similar small central disc protrusion with 8 mm contiguous inferior migration and enhancement. Small broad-based disc bulge. Mild facet arthropathy without canal stenosis. Mild LEFT neural foraminal narrowing. IMPRESSION: MRI thoracic spine: 1. 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 Small LEFT pleural effusion and trace suspected empyema. Findings would be better demonstrated on CT chest with contrast. 2. LEFT paraspinal myositis. 3. Mild symmetric T4-5 facet edema favoring inflammatory changes, less likely infection/septic arthropathy. MRI lumbar spine: 1. 1.1 x 4.5 x 7.9 cm LEFT superficial paraspinal abscess. 2. No discitis, osteomyelitis or epidural abscess. 3. Stable degenerative change of the lumbar spine. No canal stenosis. Mild LEFT L5-S1 neural foraminal narrowing. These results will be called to the ordering clinician or representative by the Radiologist Assistant, and communication documented in the PACS or zVision Dashboard. Electronically Signed   By: Awilda Metro [name]   On: 09/06/2017 01:38   US Guided Needle Placement  Result Date: 09/06/2017 CLINICAL DATA:  History of spinal infection. Recent MR demonstrates left superficial paraspinal abscess. EXAM: IR ULTRASOUND GUIDED ASPIRATION COMPARISON:  MR 09/05/2017 TECHNIQUE: The  procedure, risks (including but not limited to bleeding,  infection, organ damage ), benefits, and alternatives were explained to the patient. Questions regarding the procedure were encouraged and answered. The patient understands and consents to the procedure. Intravenous Fentanyl and Versed were administered as conscious sedation during continuous monitoring of the patient's level of consciousness and physiological / cardiorespiratory status by the radiology RN, with a total moderate sedation time of 10 minutes. Survey ultrasound of the left paraspinal region was performed and the linear fluid collection was localized. Site was marked, prepped with chlorhexidine, draped in usual sterile fashion, infiltrated locally with 1% lidocaine. Under real-time ultrasound guidance, an 18 gauge spinal needle was advanced into the collection. Approximately 1 mL of turbid serosanguineous fluid was aspirated, sent for Gram stain and culture. Postprocedure scans show no hemorrhage or other apparent complication. The patient tolerated the procedure well. IMPRESSION: 1. Technically successful aspiration of left superficial lumbar paraspinal abscess, sample sent for Gram stain and culture. Electronically Signed   By: Corlis Leak [name]   On: 09/06/2017 15:48        Scheduled Meds: . amphetamine-dextroamphetamine  30 mg Oral BID  . baclofen  10 mg Oral TID  . Brexpiprazole  2 mg Oral Daily  . busPIRone  10 mg Oral TID  . DULoxetine  30 mg Oral QHS  . DULoxetine  60 mg Oral Daily  . levETIRAcetam  1,500 mg Oral BID  . levothyroxine  100 mcg Oral QAC breakfast  . liothyronine  5 mcg Oral Daily  . magnesium oxide  400 mg Oral Daily  . multivitamin with minerals  1 tablet Oral Daily  . olopatadine  1 drop Both Eyes BID  . pantoprazole  40 mg Oral BID  . pregabalin  150 mg Oral TID  . sodium chloride flush  3 mL Intravenous Q12H  . Suvorexant  20 mg Oral QHS  . tiZANidine  6 mg Oral TID   Continuous Infusions: .  sodium chloride    . cefTRIAXone (ROCEPHIN)  IV Stopped (09/06/17 1712)  . vancomycin Stopped (09/07/17 0555)     LOS: 2 days    Time spent: 35    Delaine Lame, MD [facility]  If 7PM-7AM, please contact night-coverage [email or web address] Password [email or web address] 09/07/2017, 2:33 PM

## 2017-09-07 NOTE — Progress Notes (Signed)
Attempted echo. Patient was not in the room at the time.  Will attempt at a later time

## 2017-09-07 NOTE — Progress Notes (Signed)
  Echocardiogram 2D Echocardiogram has been performed.  [name] 09/07/2017, 3:47 PM

## 2017-09-07 NOTE — Progress Notes (Signed)
Extra 2mg  vial of dilaudid pulled from pyxis after accidentally wasting initial vial

## 2017-09-08 LAB — BASIC METABOLIC PANEL
Anion gap: 11 (ref 5–15)
BUN: 15 mg/dL (ref 6–20)
CO2: 25 mmol/L (ref 22–32)
CREATININE: 0.78 mg/dL (ref 0.44–1.00)
Calcium: 9.2 mg/dL (ref 8.9–10.3)
Chloride: 104 mmol/L (ref 101–111)
Glucose, Bld: 94 mg/dL (ref 65–99)
POTASSIUM: 5 mmol/L (ref 3.5–5.1)
SODIUM: 140 mmol/L (ref 135–145)

## 2017-09-08 MED ORDER — HYDROMORPHONE HCL 2 MG/ML IJ SOLN
1.0000 mg | Freq: Four times a day (QID) | INTRAMUSCULAR | Status: DC | PRN
  Administered 2017-09-08: 2 mg via INTRAVENOUS
  Administered 2017-09-08: 1 mg via INTRAVENOUS
  Administered 2017-09-09 – 2017-09-12 (×12): 2 mg via INTRAVENOUS
  Filled 2017-09-08 (×13): qty 1

## 2017-09-08 NOTE — Progress Notes (Addendum)
PROGRESS NOTE    [name]  WUJ:[id number] DOB: 20-Aug-1970 DOA: 09/05/2017 PCP: Primus Bravo, NP    Brief Narrative: 47 year old with past medical history relevant for relapsing remitting multiple sclerosis, chronic low back pain, Hypothyroidism, seizure disorder, who came in for acute on chronic severe back pain and found to have large paraspinal fluid collection and left rib osteomyelitis/abscess..     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  IV antibiotics.  ID following.   Focal pleural vs parenchymal abscess; will repeat chest x ray in am.  Discussed with CVTS, [name], effusion is too small to drain. Patient will need follow Ct scan, as well as follow scan for T 8-T 9 osteomyelitis.   Hypothyroidism;  Continue with synthroid.   Seizure disorder;  On keppra/   Relapsing remitting multiple sclerosis: - Hold ocrelizumab infusion -Continue baclofen 10 mg 3 times daily -Continue oxybutynin 5 mg nightly  Chronic diarrhea: -Continue colestipol 1 g daily -Continue Lomotil as needed  Pain/psych: - Continue brexpiprazole 2 mg daily -Continue buspirone 10 mg 3 times daily -Continue celecoxib 100 mg twice daily -Continue duloxetine 30 mg nightly and 60 mg daily -Continue glycopyrrolate 2 mg daily -Hold intranasal ketorolac - Continue as needed Percocet -Continue 3 times daily pregabalin 150 mg - Continue suvorexant 20 mg daily -Continue tizanidine 6 mg 3 times daily    DVT prophylaxis:  Code Status: full code.  Family Communication: care discussed with patient.  Disposition Plan: when when stable.    Consultants:   ID  IR    Procedures:   IR guided drainage of paraspinal abscess on 09/06/2017  Echo 09/07/2017: negative for valve diseases.      Antimicrobials:   IV vancomycin, ceftriaxone 4-26   Subjective: She feels her pain is not controlled. She already takes oxycodone 10/225 mg at home. Dilaudid IV is not enough.  Still having a lot back pain, feels fluid in her back   Objective: Vitals:   09/07/17 1314 09/07/17 2151 09/08/17 0450 09/08/17 1345  BP: 122/85 (!) 133/99 124/81 (!) 136/91  Pulse: 69 66 63 66  Resp: 18 18 18 19   Temp: 98.1 F (36.7 C) 97.6 F (36.4 C) 97.7 F (36.5 C) 98.3 F (36.8 C)  TempSrc: Oral Oral Oral Oral  SpO2: 97% 97% 97% 97%  Weight:   76.1 kg (167 lb 11.2 oz)   Height:        Intake/Output Summary (Last 24 hours) at 09/08/2017 1508 Last data filed at 09/08/2017 1006 Gross per 24 hour  Intake 840 ml  Output 300 ml  Net 540 ml   Filed Weights   09/05/17 1215 09/07/17 0534 09/08/17 0450  Weight: 73.5 kg (162 lb) 79.6 kg (175 lb 7.8 oz) 76.1 kg (167 lb 11.2 oz)    Examination:  General exam: Appears calm and comfortable  Respiratory system: Clear to auscultation. Respiratory effort normal. Cardiovascular system: S1 & S2 heard, RRR. No JVD, murmurs, rubs, gallops or clicks. No pedal edema. Gastrointestinal system: Abdomen is nondistended, soft and nontender. No organomegaly or masses felt. Normal bowel sounds heard. Central nervous system: Alert and oriented. No focal neurological deficits. Extremities: Symmetric 5 x 5 power. Back  para spinal muscle with mild edema no redness.  Skin: No rashes, lesions or ulcers Psychiatry: Judgement and insight appear normal. Mood & affect appropriate.     Data Reviewed: I have personally reviewed following labs and imaging studies  CBC: Recent Labs  Lab 09/05/17 1247 09/06/17 0501  WBC 9.8 9.8  NEUTROABS 7.3 6.3  HGB 13.7 12.0  HCT 41.4 37.5  MCV 100.7* 101.4*  PLT 340 305   Basic Metabolic Panel: Recent  Labs  Lab 09/05/17 1247 09/06/17 0501 09/08/17 1007  NA 142 137 140  K 5.4* 5.0 5.0  CL 106 101 104  CO2 28 24 25   GLUCOSE 107* 96 94  BUN 14 20 15   CREATININE 0.83 0.78 0.78  CALCIUM 10.2 9.4 9.2   GFR: Estimated Creatinine Clearance: 91.5 mL/min (by C-G formula based on SCr of 0.78 mg/dL). Liver Function Tests: Recent Labs  Lab 09/05/17 1247  AST 35  ALT 26  ALKPHOS 81  BILITOT 0.4  PROT 7.7  ALBUMIN 3.4*   No results for input(s): LIPASE, AMYLASE in the last 168 hours. No results for input(s): AMMONIA in the last 168 hours. Coagulation Profile: No results for input(s): INR, PROTIME in the last 168 hours. Cardiac Enzymes: No results for input(s): CKTOTAL, CKMB, CKMBINDEX, TROPONINI in the last 168 hours. BNP (last 3 results) No results for input(s): PROBNP in the last 8760 hours. HbA1C: No results for input(s): HGBA1C in the last 72 hours. CBG: No results for input(s): GLUCAP in the last 168 hours. Lipid Profile: No results for input(s): CHOL, HDL, LDLCALC, TRIG, CHOLHDL, LDLDIRECT in the last 72 hours. Thyroid Function Tests: No results for input(s): TSH, T4TOTAL, FREET4, T3FREE, THYROIDAB in the last 72 hours. Anemia Panel: No results for input(s): VITAMINB12, FOLATE, FERRITIN, TIBC, IRON, RETICCTPCT in the last 72 hours. Sepsis Labs: Recent Labs  Lab 09/05/17 1336  LATICACIDVEN 1.23    Recent Results (from the past 240 hour(s))  Aerobic/Anaerobic Culture (surgical/deep wound)     Status: None (Preliminary result)   Collection Time: 09/06/17  2:48 PM  Result Value Ref Range Status   Specimen Description ABSCESS  Final   Special Requests PARAVETEBRAL  Final   Gram Stain   Final    RARE WBC PRESENT,BOTH PMN AND MONONUCLEAR RARE GRAM POSITIVE COCCI    Culture   Final    NO GROWTH 2 DAYS NO ANAEROBES ISOLATED; CULTURE IN PROGRESS FOR 5 DAYS Performed at [facility], [address] [place], [place] [address]    Report Status PENDING  Incomplete   Acid Fast Smear (AFB)     Status: None   Collection Time: 09/06/17  3:27 PM  Result Value Ref Range Status   AFB Specimen Processing Concentration  Final   Acid Fast Smear Negative  Final    Comment: (NOTE) Performed At: [facility] [address] [place], [place] [id number] Jolene Schimke MD ES:[id number] Performed at [facility], [address] [place], [place] [address]    Source (AFB) ABSCESS  Final         Radiology Studies: US Guided Needle Placement  Result Date: 09/06/2017 CLINICAL DATA:  History of spinal infection. Recent MR demonstrates left superficial paraspinal abscess. EXAM: IR ULTRASOUND GUIDED ASPIRATION COMPARISON:  MR 09/05/2017 TECHNIQUE: The procedure, risks (including but not limited to bleeding, infection, organ damage ), benefits, and alternatives were explained to the patient. Questions regarding the procedure were encouraged and answered. The patient understands and consents to the procedure.  Intravenous Fentanyl and Versed were administered as conscious sedation during continuous monitoring of the patient's level of consciousness and physiological / cardiorespiratory status by the radiology RN, with a total moderate sedation time of 10 minutes. Survey ultrasound of the left paraspinal region was performed and the linear fluid collection was localized. Site was marked, prepped with chlorhexidine, draped in usual sterile fashion, infiltrated locally with 1% lidocaine. Under real-time ultrasound guidance, an 18 gauge spinal needle was advanced into the collection. Approximately 1 mL of turbid serosanguineous fluid was aspirated, sent for Gram stain and culture. Postprocedure scans show no hemorrhage or other apparent complication. The patient tolerated the procedure well. IMPRESSION: 1. Technically successful aspiration of left superficial lumbar paraspinal abscess, sample sent for Gram stain and culture. Electronically Signed   By: Corlis Leak [name]    On: 09/06/2017 15:48        Scheduled Meds: . amphetamine-dextroamphetamine  30 mg Oral BID  . baclofen  10 mg Oral TID  . Brexpiprazole  2 mg Oral Daily  . busPIRone  10 mg Oral TID  . DULoxetine  30 mg Oral QHS  . DULoxetine  60 mg Oral Daily  . levETIRAcetam  1,500 mg Oral BID  . levothyroxine  100 mcg Oral QAC breakfast  . liothyronine  5 mcg Oral Daily  . magnesium oxide  400 mg Oral Daily  . multivitamin with minerals  1 tablet Oral Daily  . olopatadine  1 drop Both Eyes BID  . pantoprazole  40 mg Oral BID  . pregabalin  150 mg Oral TID  . sodium chloride flush  3 mL Intravenous Q12H  . Suvorexant  20 mg Oral QHS  . tiZANidine  6 mg Oral TID   Continuous Infusions: . sodium chloride    . cefTRIAXone (ROCEPHIN)  IV 2 g (09/07/17 1656)  . vancomycin Stopped (09/08/17 0534)     LOS: 3 days    Time spent: 35 minutes.     Alba Cory, MD [facility] Pager [phone]  If 7PM-7AM, please contact night-coverage [email or web address] Password [facility] 09/08/2017, 3:08 PM

## 2017-09-09 ENCOUNTER — Inpatient Hospital Stay (HOSPITAL_COMMUNITY): Payer: Medicare Other

## 2017-09-09 ENCOUNTER — Inpatient Hospital Stay: Payer: Self-pay

## 2017-09-09 DIAGNOSIS — K5900 Constipation, unspecified: Secondary | ICD-10-CM

## 2017-09-09 MED ORDER — POLYETHYLENE GLYCOL 3350 17 G PO PACK
17.0000 g | PACK | Freq: Two times a day (BID) | ORAL | Status: DC
  Administered 2017-09-09 – 2017-09-12 (×7): 17 g via ORAL
  Filled 2017-09-09 (×7): qty 1

## 2017-09-09 MED ORDER — SENNOSIDES-DOCUSATE SODIUM 8.6-50 MG PO TABS
1.0000 | ORAL_TABLET | Freq: Every day | ORAL | Status: DC
  Administered 2017-09-09: 1 via ORAL
  Filled 2017-09-09: qty 1

## 2017-09-09 NOTE — Progress Notes (Signed)
INFECTIOUS DISEASE PROGRESS NOTE  ID: [name] is a 47 y.o. 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HCC) Active Problems:   Multiple sclerosis (HCC)   Depression with anxiety   Hypothyroidism   Chronic pain  Subjective: C/o pleuritic L sided chest pain.   Abtx:  Anti-infectives (From admission, onward)   Start     Dose/Rate Route Frequency Ordered Stop   09/07/17 0500  vancomycin (VANCOCIN) IVPB 1000 mg/200 mL premix     1,000 mg 200 mL/hr over 60 Minutes Intravenous Every 12 hours 09/06/17 1616     09/06/17 1700  cefTRIAXone (ROCEPHIN) 2 g in sodium chloride 0.9 % 100 mL IVPB     2 g 200 mL/hr over 30 Minutes Intravenous Every 24 hours 09/06/17 1603     09/06/17 1700  vancomycin (VANCOCIN) 1,250 mg in sodium chloride 0.9 % 250 mL IVPB     1,250 mg 166.7 mL/hr over 90 Minutes Intravenous  Once 09/06/17 1616 09/06/17 1949      Medications:  Scheduled: . amphetamine-dextroamphetamine  30 mg Oral BID  . baclofen  10 mg Oral TID  . Brexpiprazole  2 mg Oral Daily  . busPIRone  10 mg Oral TID  . DULoxetine  30 mg Oral QHS  . DULoxetine  60 mg Oral Daily  . levETIRAcetam  1,500 mg Oral BID  . levothyroxine  100 mcg Oral QAC breakfast  . liothyronine  5 mcg Oral Daily  . magnesium oxide  400 mg Oral Daily  . multivitamin with minerals  1 tablet Oral Daily  . olopatadine  1 drop Both Eyes BID  . pantoprazole  40 mg Oral BID  . pregabalin  150 mg Oral TID  . sodium chloride flush  3 mL Intravenous Q12H  . Suvorexant  20 mg Oral QHS  . tiZANidine  6 mg Oral TID    Objective: Vital signs in last 24 hours: Temp:  [97.8 F (36.6 C)-98.3 F (36.8 C)] 98.3 F (36.8 C) (04/29 0517) Pulse Rate:  [58-69] 58 (04/29 0517) Resp:  [18-19] 18 (04/29 0517) BP: (96-136)/(62-91) 96/62 (04/29 0517) SpO2:  [96 %-97 %] 96 % (04/29 0517) Weight:  [76.3 kg (168 lb 4.8 oz)] 76.3 kg (168 lb 4.8 oz) (04/29 0517)   General appearance: alert, cooperative and no  distress Resp: clear to auscultation bilaterally Chest wall: left sided chest wall tenderness Cardio: regular rate and rhythm GI: normal findings: bowel sounds normal and soft, non-tender  Lab Results Recent Labs    09/08/17 1007  NA 140  K 5.0  CL 104  CO2 25  BUN 15  CREATININE 0.78   Liver Panel No results for input(s): PROT, ALBUMIN, AST, ALT, ALKPHOS, BILITOT, BILIDIR, IBILI in the last 72 hours. Sedimentation Rate No results for input(s): ESRSEDRATE in the last 72 hours. C-Reactive Protein No results for input(s): CRP in the last 72 hours.  Microbiology: Recent Results (from the past 240 hour(s))  Aerobic/Anaerobic Culture (surgical/deep wound)     Status: None (Preliminary result)   Collection Time: 09/06/17  2:48 PM  Result Value Ref Range Status   Specimen Description ABSCESS  Final   Special Requests PARAVETEBRAL  Final   Gram Stain   Final    RARE WBC PRESENT,BOTH PMN AND MONONUCLEAR RARE GRAM POSITIVE COCCI    Culture   Final    NO GROWTH 2 DAYS NO ANAEROBES ISOLATED; CULTURE IN PROGRESS FOR 5 DAYS Performed at [facility], [address], [place], [place] [address]    Report Status PENDING  Incomplete  Acid Fast Smear (AFB)     Status: None   Collection Time: 09/06/17  3:27 PM  Result Value Ref Range Status   AFB Specimen Processing Concentration  Final   Acid Fast Smear Negative  Final    Comment: (NOTE) Performed At: [facility] [address] [place], [place] [id number] Jolene Schimke MD [id number] Performed at [facility], [address] [place], [place] [address]    Source (AFB) ABSCESS  Final    Studies/Results: Dg Chest 2 View  Result Date: 09/09/2017 CLINICAL DATA:  Shortness of breath, chest pain EXAM: CHEST - 2 VIEW COMPARISON:  Chest CT 09/06/2017 FINDINGS: Increasing left basilar atelectasis or infiltrate. Small left pleural effusion. No pneumothorax. Right lung is clear. Heart is normal size. IMPRESSION:  Increasing left basilar atelectasis or infiltrate. Small left effusion. Electronically Signed   By: Charlett Nose [name]   On: 09/09/2017 08:00     Assessment/Plan: MS  paraspinal abscess.  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  L rib fractures Small empyema constipation  Total days of antibiotics: 4 vanco/ceftriaxone      Aspirate showing rare GPC  Await further ID, will speak with lab No change in anbx for now. Appreciate TRH help with bowels     Johny Sax MD, FACP Infectious Diseases (pager) [phone] [email or web address] 09/09/2017, 10:40 AM  LOS: 4 days

## 2017-09-09 NOTE — Progress Notes (Signed)
Pharmacy Antibiotic Note  [name] is a 47 y.o. female admitted on 09/05/2017 with fevers and mid-back pain.  Patient is diagnosed with spinal osteomyelitis with abscess and Pharmacy has been consulted for vancomycin dosing.  She is also on Rocephin.  Renal function stable; afebrile, WBC WNL.   Plan: Continue vancomycin 1g IV Q12H Continue ceftriaxone 2g IV Q24H Monitor clinical picture, renal function, vanc trough as indicated   Height: 5\' 7"  (170.2 cm) Weight: 168 lb 4.8 oz (76.3 kg) IBW/kg (Calculated) : 61.6  Temp (24hrs), Avg:98.1 F (36.7 C), Min:97.8 F (36.6 C), Max:98.3 F (36.8 C)  Recent Labs  Lab 09/05/17 1247 09/05/17 1336 09/06/17 0501 09/08/17 1007  WBC 9.8  --  9.8  --   CREATININE 0.83  --  0.78 0.78  LATICACIDVEN  --  1.23  --   --     Estimated Creatinine Clearance: 91.6 mL/min (by C-G formula based on SCr of 0.78 mg/dL).    Allergies  Allergen Reactions  . Levaquin [Levofloxacin In D5w] Shortness Of Breath  . Bactrim [Sulfamethoxazole-Trimethoprim] Other (See Comments)    "really red all over, hot skinned"  . Doxycycline Hives  . Other     Seasonal allergies  . Latex Rash    Vanc 4/26 >> CTX 4/26 >>  4/26 Acid fast smear: sent 4/26 Paravetebral abscess - GPC 4/26 Hep A/ B / C - negative 4/26 HIV - negativ   [name] D. Laney Potash, PharmD, BCPS, BCCCP Pager:  [phone] 09/09/2017, 11:21 AM

## 2017-09-09 NOTE — Care Management Important Message (Signed)
Important Message  Patient Details  Name: [name] MRN: [id number] Date of Birth: December 06, 1970   Medicare Important Message Given:  Yes    Marcelino Campos Stefan Church 09/09/2017, 3:47 PM

## 2017-09-09 NOTE — Progress Notes (Signed)
PROGRESS NOTE    [name]  DUK:[id number] DOB: 03-30-1971 DOA: 09/05/2017 PCP: Primus Bravo, NP    Brief Narrative: 47 year old with past medical history relevant for relapsing remitting multiple sclerosis, chronic low back pain, Hypothyroidism, seizure disorder, who came in for acute on chronic severe back pain and found to have large paraspinal fluid collection and left rib osteomyelitis/abscess..     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  IV antibiotics. Vancomycin and ceftriaxone.  ID following.   Focal pleural vs parenchymal abscess; will repeat chest x ray in am.  Discussed with CVTS, [name], effusion is too small to drain. Patient will need follow Ct scan, as well as follow scan for T 8-T 9 osteomyelitis.  Chest x ray with small effusion.  Incentive spirometry for atelectasis.   Hypothyroidism;  Continue with synthroid.   Seizure disorder;  On keppra/   Relapsing remitting multiple sclerosis: - Hold ocrelizumab infusion -Continue baclofen 10 mg 3 times daily -Continue oxybutynin 5 mg nightly  Chronic diarrhea: -Continue colestipol 1 g daily -Continue Lomotil as needed  Pain/psych: - Continue brexpiprazole 2 mg daily -Continue buspirone 10 mg 3 times daily -Continue celecoxib 100 mg twice daily -Continue duloxetine 30 mg nightly and 60 mg daily -Continue glycopyrrolate 2 mg daily -Hold intranasal ketorolac - Continue as needed Percocet -Continue 3 times daily pregabalin 150 mg - Continue suvorexant 20 mg daily -Continue tizanidine 6 mg 3 times daily  Constipation; start miralax. Senokot   DVT prophylaxis:  Code Status: full  code.  Family Communication: care discussed with patient.  Disposition Plan: when when stable. Pending culture.    Consultants:   ID  IR   Procedures:   IR guided drainage of paraspinal abscess on 09/06/2017  Echo 09/07/2017: negative for valve diseases.      Antimicrobials:   IV vancomycin, ceftriaxone 4-26   Subjective: No BM in days,.  Denies abdominal pain   Objective: Vitals:   09/08/17 0450 09/08/17 1345 09/08/17 2039 09/09/17 0517  BP: 124/81 (!) 136/91 125/74 96/62  Pulse: 63 66 69 (!) 58  Resp: 18 19 18 18   Temp: 97.7 F (36.5 C) 98.3 F (36.8 C) 97.8 F (36.6 C) 98.3 F (36.8 C)  TempSrc: Oral Oral Oral Oral  SpO2: 97% 97% 96% 96%  Weight: 76.1 kg (167 lb 11.2 oz)   76.3 kg (168 lb 4.8 oz)  Height:        Intake/Output Summary (Last 24 hours) at 09/09/2017 1059 Last data filed at 09/09/2017 1003 Gross per 24 hour  Intake 433 ml  Output -  Net 433 ml   Filed Weights   09/07/17 0534 09/08/17 0450 09/09/17 0517  Weight: 79.6 kg (175 lb 7.8 oz) 76.1 kg (167 lb 11.2 oz) 76.3 kg (168 lb 4.8 oz)    Examination:  General exam: NAD Respiratory system: CTA Cardiovascular system: S 1, S 2 RRR Gastrointestinal system: BS present, soft, nt Central nervous system: non focal.  Extremities: Symmetric 5 x 5 power. Back para spinal muscle with mild edema no redness.  Skin: no rash  Psychiatry: mood appropriate    Data Reviewed: I have personally reviewed following labs and imaging studies  CBC: Recent Labs  Lab 09/05/17 1247 09/06/17 0501  WBC 9.8 9.8  NEUTROABS 7.3 6.3  HGB 13.7 12.0  HCT 41.4 37.5  MCV 100.7* 101.4*  PLT 340 305   Basic Metabolic Panel: Recent Labs  Lab 09/05/17 1247 09/06/17 0501 09/08/17 1007  NA 142 137 140  K 5.4* 5.0 5.0  CL 106 101 104  CO2 28 24 25   GLUCOSE 107* 96 94  BUN 14 20 15   CREATININE 0.83 0.78 0.78  CALCIUM 10.2 9.4 9.2   GFR: Estimated Creatinine Clearance: 91.6 mL/min (by C-G formula based on  SCr of 0.78 mg/dL). Liver Function Tests: Recent Labs  Lab 09/05/17 1247  AST 35  ALT 26  ALKPHOS 81  BILITOT 0.4  PROT 7.7  ALBUMIN 3.4*   No results for input(s): LIPASE, AMYLASE in the last 168 hours. No results for input(s): AMMONIA in the last 168 hours. Coagulation Profile: No results for input(s): INR, PROTIME in the last 168 hours. Cardiac Enzymes: No results for input(s): CKTOTAL, CKMB, CKMBINDEX, TROPONINI in the last 168 hours. BNP (last 3 results) No results for input(s): PROBNP in the last 8760 hours. HbA1C: No results for input(s): HGBA1C in the last 72 hours. CBG: No results for input(s): GLUCAP in the last 168 hours. Lipid Profile: No results for input(s): CHOL, HDL, LDLCALC, TRIG, CHOLHDL, LDLDIRECT in the last 72 hours. Thyroid Function Tests: No results for input(s): TSH, T4TOTAL, FREET4, T3FREE, THYROIDAB in the last 72 hours. Anemia Panel: No results for input(s): VITAMINB12, FOLATE, FERRITIN, TIBC, IRON, RETICCTPCT in the last 72 hours. Sepsis Labs: Recent Labs  Lab 09/05/17 1336  LATICACIDVEN 1.23    Recent Results (from the past 240 hour(s))  Aerobic/Anaerobic Culture (surgical/deep wound)     Status: None (Preliminary result)   Collection Time: 09/06/17  2:48 PM  Result Value Ref Range Status   Specimen Description ABSCESS  Final   Special Requests PARAVETEBRAL  Final   Gram Stain   Final    RARE WBC PRESENT,BOTH PMN AND MONONUCLEAR RARE GRAM POSITIVE COCCI    Culture   Final    NO GROWTH 2 DAYS NO ANAEROBES ISOLATED; CULTURE IN PROGRESS FOR 5 DAYS Performed at [facility], [address] [place], [place] [address]    Report Status PENDING  Incomplete  Acid Fast Smear (AFB)     Status: None   Collection Time: 09/06/17  3:27 PM  Result Value Ref Range Status   AFB Specimen Processing Concentration  Final   Acid Fast Smear Negative  Final    Comment: (NOTE) Performed At: [facility] [address] [place], [place]  [id number] Jolene Schimke MD [id number] Performed at [facility], [address], [place], [place] [address]    Source (AFB) ABSCESS  Final         Radiology Studies: Dg Chest 2 View  Result Date: 09/09/2017 CLINICAL DATA:  Shortness of breath, chest pain EXAM: CHEST - 2 VIEW COMPARISON:  Chest CT 09/06/2017 FINDINGS: Increasing left basilar atelectasis or infiltrate. Small left pleural effusion. No pneumothorax. Right lung is clear. Heart is normal size. IMPRESSION: Increasing left basilar atelectasis or infiltrate. Small left effusion. Electronically Signed   By: Charlett Nose [name]   On: 09/09/2017 08:00        Scheduled Meds: . amphetamine-dextroamphetamine  30 mg Oral BID  . baclofen  10 mg Oral TID  . Brexpiprazole  2 mg Oral Daily  . busPIRone  10 mg Oral  TID  . DULoxetine  30 mg Oral QHS  . DULoxetine  60 mg Oral Daily  . levETIRAcetam  1,500 mg Oral BID  . levothyroxine  100 mcg Oral QAC breakfast  . liothyronine  5 mcg Oral Daily  . magnesium oxide  400 mg Oral Daily  . multivitamin with minerals  1 tablet Oral Daily  . olopatadine  1 drop Both Eyes BID  . pantoprazole  40 mg Oral BID  . polyethylene glycol  17 g Oral BID  . pregabalin  150 mg Oral TID  . senna-docusate  1 tablet Oral QHS  . sodium chloride flush  3 mL Intravenous Q12H  . Suvorexant  20 mg Oral QHS  . tiZANidine  6 mg Oral TID   Continuous Infusions: . sodium chloride    . cefTRIAXone (ROCEPHIN)  IV 2 g (09/08/17 1648)  . vancomycin 1,000 mg (09/09/17 0531)     LOS: 4 days    Time spent: 35 minutes.     Alba Cory, MD [facility] Pager [phone]  If 7PM-7AM, please contact night-coverage [email or web address] Password [facility] 09/09/2017, 10:59 AM

## 2017-09-10 ENCOUNTER — Inpatient Hospital Stay (HOSPITAL_COMMUNITY): Payer: Medicare Other

## 2017-09-10 ENCOUNTER — Other Ambulatory Visit: Payer: Self-pay

## 2017-09-10 DIAGNOSIS — X58XXXA Exposure to other specified factors, initial encounter: Secondary | ICD-10-CM

## 2017-09-10 DIAGNOSIS — S2242XA Multiple fractures of ribs, left side, initial encounter for closed fracture: Secondary | ICD-10-CM

## 2017-09-10 LAB — VANCOMYCIN, TROUGH: Vancomycin Tr: 14 ug/mL — ABNORMAL LOW (ref 15–20)

## 2017-09-10 MED ORDER — SENNOSIDES-DOCUSATE SODIUM 8.6-50 MG PO TABS
1.0000 | ORAL_TABLET | Freq: Two times a day (BID) | ORAL | Status: DC
  Administered 2017-09-10 – 2017-09-12 (×4): 1 via ORAL
  Filled 2017-09-10 (×3): qty 1

## 2017-09-10 MED ORDER — VANCOMYCIN HCL 10 G IV SOLR
1500.0000 mg | Freq: Two times a day (BID) | INTRAVENOUS | Status: DC
  Administered 2017-09-10 – 2017-09-12 (×5): 1500 mg via INTRAVENOUS
  Filled 2017-09-10 (×6): qty 1500

## 2017-09-10 NOTE — Progress Notes (Deleted)
Pharmacy Antibiotic Note  [name] is a 47 y.o. female admitted on 09/05/2017 with fevers and mid-back pain.  Patient is diagnosed with spinal osteomyelitis with abscess and Pharmacy has been consulted for vancomycin dosing.  She is also on Rocephin.  Renal function stable; afebrile, WBC WNL.  4/30 AM: Vancomycin trough is 14, drawn one hour early, true trough likely ~15  Plan: Continue vancomycin 1g IV Q12H Continue ceftriaxone 2g IV Q24H Monitor clinical picture, renal function, vanc trough as indicated   Height: 5\' 7"  (170.2 cm) Weight: 168 lb 4.8 oz (76.3 kg) IBW/kg (Calculated) : 61.6  Temp (24hrs), Avg:98.2 F (36.8 C), Min:97.7 F (36.5 C), Max:98.6 F (37 C)  Recent Labs  Lab 09/05/17 1247 09/05/17 1336 09/06/17 0501 09/08/17 1007 09/10/17 0407  WBC 9.8  --  9.8  --   --   CREATININE 0.83  --  0.78 0.78  --   LATICACIDVEN  --  1.23  --   --   --   VANCOTROUGH  --   --   --   --  14*    Estimated Creatinine Clearance: 91.6 mL/min (by C-G formula based on SCr of 0.78 mg/dL).    Allergies  Allergen Reactions  . Levaquin [Levofloxacin In D5w] Shortness Of Breath  . Bactrim [Sulfamethoxazole-Trimethoprim] Other (See Comments)    "really red all over, hot skinned"  . Doxycycline Hives  . Other     Seasonal allergies  . Latex Rash    Vanc 4/26 >> CTX 4/26 >>  4/26 Acid fast smear: sent 4/26 Paravetebral abscess - GPC 4/26 Hep A/ B / C - negative 4/26 HIV - negative   Abran Duke, PharmD, BCPS Clinical Pharmacist Phone: [phone]

## 2017-09-10 NOTE — Progress Notes (Signed)
PROGRESS NOTE    [name]  VFM:[id number] DOB: 1970/06/14 DOA: 09/05/2017 PCP: Primus Bravo, NP    Brief Narrative: 47 year old with past medical history relevant for relapsing remitting multiple sclerosis, chronic low back pain, Hypothyroidism, seizure disorder, who came in for acute on chronic severe back pain and found to have large paraspinal fluid collection and left rib osteomyelitis/abscess..   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  IV antibiotics. Vancomycin and ceftriaxone.  ID following. Follow ID recommendation for antibiotics for discharge.  Blood culture 4-29; no growth in 24 hours.  Follow ID recommendation for timing for repeat CT chest  Report more tightness in her back today. Will FU ID rec.   Focal pleural vs parenchymal abscess; Discussed with CVTS, [name], effusion is too small to drain. Patient will need follow Ct scan, as well as follow scan for T 8-T 9 osteomyelitis.  Chest x ray with small effusion.  Incentive spirometry for atelectasis.  On IV antibiotics.   Hypothyroidism;  Continue with synthroid.   Seizure disorder;  On keppra/   Relapsing remitting multiple sclerosis: - Hold ocrelizumab infusion -Continue baclofen 10 mg 3 times daily -Continue oxybutynin 5 mg nightly  Constipation;  KUB negative for obstruction.  Started on miralax and senna.  Suppository today.  Might need fleet enema also.   Chronic diarrhea: -holding  colestipol 1 g daily -Continue Lomotil as needed  Pain/psych: - Continue brexpiprazole 2 mg daily -Continue buspirone 10 mg 3 times daily -Continue celecoxib 100 mg twice daily -Continue  duloxetine 30 mg nightly and 60 mg daily -Continue glycopyrrolate 2 mg daily -Hold intranasal ketorolac - Continue as needed Percocet -Continue 3 times daily pregabalin 150 mg - Continue suvorexant 20 mg daily -Continue tizanidine 6 mg 3 times daily  Constipation; start miralax. Senokot   DVT prophylaxis:  Code Status: full code.  Family Communication: care discussed with patient.  Disposition Plan: when when stable. Pending culture.    Consultants:   ID  IR   Procedures:   IR guided drainage of paraspinal abscess on 09/06/2017  Echo 09/07/2017: negative for valve diseases.      Antimicrobials:   IV vancomycin, ceftriaxone 4-26   Subjective: She feels tightness in her back, more pressure. No BM in the last 6 day,   Objective: Vitals:   09/09/17 0517 09/09/17 1458 09/09/17 2130 09/10/17 0510  BP: 96/62 128/79 125/83 102/68  Pulse: (!) 58 65 67 64  Resp: 18  18 17   Temp: 98.3 F (36.8 C) 97.7 F (36.5 C) 98.6 F (37 C) 98.3 F (36.8 C)  TempSrc: Oral Oral Oral Oral  SpO2: 96% 97% 95% 97%  Weight: 76.3 kg (168 lb 4.8 oz)     Height:        Intake/Output Summary (Last 24 hours) at 09/10/2017 1324 Last data filed at 09/10/2017 0938 Gross per 24 hour  Intake 1760 ml  Output -  Net 1760 ml   Filed Weights   09/07/17 0534 09/08/17 0450 09/09/17 0517  Weight: 79.6 kg (175 lb 7.8 oz) 76.1 kg (167 lb 11.2 oz) 76.3 kg (168 lb 4.8 oz)    Examination:  General exam: NAD Respiratory system: CTA  Cardiovascular system: S 1, S 2 RRR Gastrointestinal system: BS present, soft, nt Central nervous system: non focal.  Extremities: symmetric power.  Psychiatry:  Anxious, crying, worry    Data Reviewed: I have personally reviewed following labs and imaging studies  CBC: Recent Labs  Lab 09/05/17 1247 09/06/17 0501  WBC 9.8 9.8  NEUTROABS 7.3 6.3  HGB 13.7 12.0  HCT 41.4 37.5  MCV 100.7* 101.4*  PLT 340 305   Basic Metabolic Panel: Recent Labs  Lab  09/05/17 1247 09/06/17 0501 09/08/17 1007  NA 142 137 140  K 5.4* 5.0 5.0  CL 106 101 104  CO2 28 24 25   GLUCOSE 107* 96 94  BUN 14 20 15   CREATININE 0.83 0.78 0.78  CALCIUM 10.2 9.4 9.2   GFR: Estimated Creatinine Clearance: 91.6 mL/min (by C-G formula based on SCr of 0.78 mg/dL). Liver Function Tests: Recent Labs  Lab 09/05/17 1247  AST 35  ALT 26  ALKPHOS 81  BILITOT 0.4  PROT 7.7  ALBUMIN 3.4*   No results for input(s): LIPASE, AMYLASE in the last 168 hours. No results for input(s): AMMONIA in the last 168 hours. Coagulation Profile: No results for input(s): INR, PROTIME in the last 168 hours. Cardiac Enzymes: No results for input(s): CKTOTAL, CKMB, CKMBINDEX, TROPONINI in the last 168 hours. BNP (last 3 results) No results for input(s): PROBNP in the last 8760 hours. HbA1C: No results for input(s): HGBA1C in the last 72 hours. CBG: No results for input(s): GLUCAP in the last 168 hours. Lipid Profile: No results for input(s): CHOL, HDL, LDLCALC, TRIG, CHOLHDL, LDLDIRECT in the last 72 hours. Thyroid Function Tests: No results for input(s): TSH, T4TOTAL, FREET4, T3FREE, THYROIDAB in the last 72 hours. Anemia Panel: No results for input(s): VITAMINB12, FOLATE, FERRITIN, TIBC, IRON, RETICCTPCT in the last 72 hours. Sepsis Labs: Recent Labs  Lab 09/05/17 1336  LATICACIDVEN 1.23    Recent Results (from the past 240 hour(s))  Aerobic/Anaerobic Culture (surgical/deep wound)     Status: None (Preliminary result)   Collection Time: 09/06/17  2:48 PM  Result Value Ref Range Status   Specimen Description ABSCESS  Final   Special Requests PARAVETEBRAL  Final   Gram Stain   Final    RARE WBC PRESENT,BOTH PMN AND MONONUCLEAR RARE GRAM POSITIVE COCCI    Culture   Final    NO GROWTH 4 DAYS NO ANAEROBES ISOLATED; CULTURE IN PROGRESS FOR 5 DAYS Performed at [facility], [address], [place], [place] [address]    Report Status PENDING  Incomplete  Acid  Fast Smear (AFB)     Status: None   Collection Time: 09/06/17  3:27 PM  Result Value Ref Range Status   AFB Specimen Processing Concentration  Final   Acid Fast Smear Negative  Final    Comment: (NOTE) Performed At: [facility] [address] [place], [place] [id number] Jolene Schimke MD FA:[id number] Performed at [facility], [address], [place], [place] [address]    Source (AFB) ABSCESS  Final  Culture, blood (routine x 2)     Status: None (Preliminary result)   Collection Time: 09/09/17  3:30 PM  Result Value Ref Range Status   Specimen Description BLOOD RIGHT HAND  Final   Special Requests   Final    BOTTLES DRAWN AEROBIC ONLY Blood Culture adequate volume   Culture   Final    NO GROWTH < 24 HOURS Performed at [facility], [address], [place], [place] [address]    Report Status PENDING  Incomplete  Culture, blood (routine x 2)     Status: None (Preliminary result)   Collection Time: 09/09/17  3:35 PM  Result Value Ref Range Status   Specimen Description BLOOD LEFT ANTECUBITAL  Final   Special Requests   Final    BOTTLES DRAWN AEROBIC ONLY Blood Culture adequate volume   Culture   Final    NO GROWTH < 24 HOURS Performed at [facility], [address] [place], [place] [address]    Report Status PENDING  Incomplete         Radiology Studies: Dg Chest 2 View  Result Date: 09/09/2017 CLINICAL DATA:  Shortness of breath, chest pain EXAM: CHEST - 2 VIEW COMPARISON:  Chest CT 09/06/2017 FINDINGS: Increasing left basilar atelectasis or infiltrate. Small left pleural effusion. No pneumothorax. Right lung is clear. Heart is normal size. IMPRESSION: Increasing left basilar atelectasis or infiltrate. Small left effusion. Electronically Signed   By: Charlett Nose [name]   On: 09/09/2017 08:00   Dg Abd 1 View  Result Date: 09/10/2017 CLINICAL DATA:  History of multiple sclerosis, gastroenteritis, spinal infection. Recent constipation with  subsequent cleanout but now brief current constipation. EXAM: ABDOMEN - 1 VIEW COMPARISON:  None in PACs FINDINGS: The colonic stool burden is increased. There is no fecal impaction. There is no small or large bowel obstructive pattern. There are calcifications measuring up to 3 mm in diameter projecting over the left kidney. The bony structures are unremarkable. IMPRESSION: Increased colonic stool burden compatible with constipation in the appropriate clinical setting. No evidence of a small or large bowel obstruction. Electronically Signed   By: David  Swaz[place] [name]   On: 09/10/2017 13:20   [place] Ekg Site Rite  Result Date: 09/09/2017 If Site Rite image not attached, placement could not be confirmed due to current cardiac rhythm.       Scheduled Meds: . amphetamine-dextroamphetamine  30 mg Oral BID  . baclofen  10 mg Oral TID  . Brexpiprazole  2 mg Oral Daily  . busPIRone  10 mg Oral TID  . DULoxetine  30 mg Oral QHS  . DULoxetine  60 mg Oral Daily  . levETIRAcetam  1,500 mg Oral BID  . levothyroxine  100 mcg Oral QAC breakfast  . liothyronine  5 mcg Oral Daily  . magnesium oxide  400 mg Oral Daily  . multivitamin with minerals  1 tablet Oral Daily  . olopatadine  1 drop Both Eyes BID  . pantoprazole  40 mg Oral BID  . polyethylene glycol  17 g Oral BID  . pregabalin  150 mg Oral TID  . senna-docusate  1 tablet Oral QHS  . sodium chloride flush  3 mL Intravenous Q12H  . Suvorexant  20 mg Oral QHS  . tiZANidine  6 mg Oral TID   Continuous Infusions: . sodium chloride    . cefTRIAXone (ROCEPHIN)  IV Stopped (09/09/17 1735)  . vancomycin       LOS: 5 days    Time spent: 35 minutes.     Alba Cory, MD [facility] Pager [phone]  If 7PM-7AM, please contact night-coverage [email or web address] Password [email or web address] 09/10/2017, 1:24 PM

## 2017-09-10 NOTE — Progress Notes (Signed)
Pt has stool in her colon, spoke to MD about this, pt will need her PRN Fleets enema.  Spoke to Pt about this as well,  She requested this later in the afternoon, due to stomach was a little upset.

## 2017-09-10 NOTE — Progress Notes (Signed)
Pharmacy Antibiotic Note  [name] is a 47 y.o. female admitted on 09/05/2017 with paraspinal abscess and osteomyelitis.  Pharmacy has been consulted for vancomycin dosing.  Vanc trough is low, and given timing of lab/doses true trough is closer to 11.  Plan: Change vancomycin to 1500mg  IV every 12 hours for calculated trough ~18.  Goal trough 15-20 mcg/mL.  Height: 5\' 7"  (170.2 cm) Weight: 168 lb 4.8 oz (76.3 kg) IBW/kg (Calculated) : 61.6  Temp (24hrs), Avg:98.2 F (36.8 C), Min:97.7 F (36.5 C), Max:98.6 F (37 C)  Recent Labs  Lab 09/05/17 1247 09/05/17 1336 09/06/17 0501 09/08/17 1007 09/10/17 0407  WBC 9.8  --  9.8  --   --   CREATININE 0.83  --  0.78 0.78  --   LATICACIDVEN  --  1.23  --   --   --   VANCOTROUGH  --   --   --   --  14*    Estimated Creatinine Clearance: 91.6 mL/min (by C-G formula based on SCr of 0.78 mg/dL).    Allergies  Allergen Reactions  . Levaquin [Levofloxacin In D5w] Shortness Of Breath  . Bactrim [Sulfamethoxazole-Trimethoprim] Other (See Comments)    "really red all over, hot skinned"  . Doxycycline Hives  . Other     Seasonal allergies  . Latex Rash    Antimicrobials this admission: Vanc 4/26 >>  CTX 4/26 >>   Dose adjustments this admission: Vanc 1g IV Q12H > trough ~11 > vanc 1500mg  Q12H  Microbiology results: 4/26 Acid fast smear: sent 4/26 Paravetebral abscess - GPC 4/26 Hep A/ B / C - negative 4/26 HIV - negative  Thank you for allowing pharmacy to be a part of this patient's care.  Vernard Gambles, PharmD, BCPS  09/10/2017 5:31 AM

## 2017-09-10 NOTE — Progress Notes (Signed)
INFECTIOUS DISEASE PROGRESS NOTE  ID: [name] is a 47 y.o. 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HCC) Active Problems:   Multiple sclerosis (HCC)   Depression with anxiety   Hypothyroidism   Chronic pain  Subjective: C/o constipation.   Abtx:  Anti-infectives (From admission, onward)   Start     Dose/Rate Route Frequency Ordered Stop   09/10/17 1400  vancomycin (VANCOCIN) 1,500 mg in sodium chloride 0.9 % 500 mL IVPB     1,500 mg 250 mL/hr over 120 Minutes Intravenous Every 12 hours 09/10/17 0530     09/07/17 0500  vancomycin (VANCOCIN) IVPB 1000 mg/200 mL premix  Status:  Discontinued     1,000 mg 200 mL/hr over 60 Minutes Intravenous Every 12 hours 09/06/17 1616 09/10/17 0529   09/06/17 1700  cefTRIAXone (ROCEPHIN) 2 g in sodium chloride 0.9 % 100 mL IVPB     2 g 200 mL/hr over 30 Minutes Intravenous Every 24 hours 09/06/17 1603     09/06/17 1700  vancomycin (VANCOCIN) 1,250 mg in sodium chloride 0.9 % 250 mL IVPB     1,250 mg 166.7 mL/hr over 90 Minutes Intravenous  Once 09/06/17 1616 09/06/17 1949      Medications:  Scheduled: . amphetamine-dextroamphetamine  30 mg Oral BID  . baclofen  10 mg Oral TID  . Brexpiprazole  2 mg Oral Daily  . busPIRone  10 mg Oral TID  . DULoxetine  30 mg Oral QHS  . DULoxetine  60 mg Oral Daily  . levETIRAcetam  1,500 mg Oral BID  . levothyroxine  100 mcg Oral QAC breakfast  . liothyronine  5 mcg Oral Daily  . magnesium oxide  400 mg Oral Daily  . multivitamin with minerals  1 tablet Oral Daily  . olopatadine  1 drop Both Eyes BID  . pantoprazole  40 mg Oral BID  . polyethylene glycol  17 g Oral BID  . pregabalin  150 mg Oral TID  . senna-docusate  1 tablet Oral BID  . sodium chloride flush  3 mL Intravenous Q12H  . Suvorexant  20 mg Oral QHS  . tiZANidine  6 mg Oral TID    Objective: Vital signs in last 24 hours: Temp:  [98.3 F (36.8 C)-98.8 F (37.1 C)] 98.8 F (37.1 C) (04/30 1713) Pulse  Rate:  [64-67] 67 (04/30 1713) Resp:  [17-20] 18 (04/30 1713) BP: (102-125)/(68-87) 121/74 (04/30 1713) SpO2:  [95 %-100 %] 97 % (04/30 1713)   General appearance: alert, cooperative and no distress  Lab Results Recent Labs    09/08/17 1007  NA 140  K 5.0  CL 104  CO2 25  BUN 15  CREATININE 0.78   Liver Panel No results for input(s): PROT, ALBUMIN, AST, ALT, ALKPHOS, BILITOT, BILIDIR, IBILI in the last 72 hours. Sedimentation Rate No results for input(s): ESRSEDRATE in the last 72 hours. C-Reactive Protein No results for input(s): CRP in the last 72 hours.  Microbiology: Recent Results (from the past 240 hour(s))  Aerobic/Anaerobic Culture (surgical/deep wound)     Status: None (Preliminary result)   Collection Time: 09/06/17  2:48 PM  Result Value Ref Range Status   Specimen Description ABSCESS  Final   Special Requests PARAVETEBRAL  Final   Gram Stain   Final    RARE WBC PRESENT,BOTH PMN AND MONONUCLEAR RARE GRAM POSITIVE COCCI    Culture   Final    NO GROWTH 4 DAYS NO ANAEROBES ISOLATED; CULTURE IN PROGRESS  FOR 5 DAYS Performed at [facility], [address], [place], [place] [address]    Report Status PENDING  Incomplete  Acid Fast Smear (AFB)     Status: None   Collection Time: 09/06/17  3:27 PM  Result Value Ref Range Status   AFB Specimen Processing Concentration  Final   Acid Fast Smear Negative  Final    Comment: (NOTE) Performed At: [facility] [address] [place], [place] [id number] Jolene Schimke MD [id number] Performed at [facility], [address] [place], [place] [address]    Source (AFB) ABSCESS  Final  Culture, blood (routine x 2)     Status: None (Preliminary result)   Collection Time: 09/09/17  3:30 PM  Result Value Ref Range Status   Specimen Description BLOOD RIGHT HAND  Final   Special Requests   Final    BOTTLES DRAWN AEROBIC ONLY Blood Culture adequate volume   Culture   Final    NO GROWTH < 24  HOURS Performed at [facility], [address], [place], [place] [address]    Report Status PENDING  Incomplete  Culture, blood (routine x 2)     Status: None (Preliminary result)   Collection Time: 09/09/17  3:35 PM  Result Value Ref Range Status   Specimen Description BLOOD LEFT ANTECUBITAL  Final   Special Requests   Final    BOTTLES DRAWN AEROBIC ONLY Blood Culture adequate volume   Culture   Final    NO GROWTH < 24 HOURS Performed at [facility], [address] [place], [place] [address]    Report Status PENDING  Incomplete    Studies/Results: Dg Chest 2 View  Result Date: 09/09/2017 CLINICAL DATA:  Shortness of breath, chest pain EXAM: CHEST - 2 VIEW COMPARISON:  Chest CT 09/06/2017 FINDINGS: Increasing left basilar atelectasis or infiltrate. Small left pleural effusion. No pneumothorax. Right lung is clear. Heart is normal size. IMPRESSION: Increasing left basilar atelectasis or infiltrate. Small left effusion. Electronically Signed   By: Charlett Nose [name]   On: 09/09/2017 08:00   Dg Abd 1 View  Result Date: 09/10/2017 CLINICAL DATA:  History of multiple sclerosis, gastroenteritis, spinal infection. Recent constipation with subsequent cleanout but now brief current constipation. EXAM: ABDOMEN - 1 VIEW COMPARISON:  None in PACs FINDINGS: The colonic stool burden is increased. There is no fecal impaction. There is no small or large bowel obstructive pattern. There are calcifications measuring up to 3 mm in diameter projecting over the left kidney. The bony structures are unremarkable. IMPRESSION: Increased colonic stool burden compatible with constipation in the appropriate clinical setting. No evidence of a small or large bowel obstruction. Electronically Signed   By: David  [place] [name]   On: 09/10/2017 13:20   [place] Ekg Site Rite  Result Date: 09/09/2017 If Site Rite image not attached, placement could not be confirmed due to current cardiac  rhythm.    Assessment/Plan: MS  paraspinal abscess.  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  L rib fractures Small empyema constipation  Total days of antibiotics: 5 vanco/ceftriaxone     Continue to await her Cx.  bowel hygiene         Johny Sax MD, FACP Infectious Diseases (pager) [phone] [email or web address] 09/10/2017, 5:44 PM  LOS: 5 days

## 2017-09-11 LAB — CBC
HEMATOCRIT: 34.9 % — AB (ref 36.0–46.0)
HEMOGLOBIN: 11.4 g/dL — AB (ref 12.0–15.0)
MCH: 32.5 pg (ref 26.0–34.0)
MCHC: 32.7 g/dL (ref 30.0–36.0)
MCV: 99.4 fL (ref 78.0–100.0)
Platelets: 203 10*3/uL (ref 150–400)
RBC: 3.51 MIL/uL — ABNORMAL LOW (ref 3.87–5.11)
RDW: 12.3 % (ref 11.5–15.5)
WBC: 6.2 10*3/uL (ref 4.0–10.5)

## 2017-09-11 LAB — BASIC METABOLIC PANEL
ANION GAP: 6 (ref 5–15)
BUN: 14 mg/dL (ref 6–20)
CHLORIDE: 108 mmol/L (ref 101–111)
CO2: 29 mmol/L (ref 22–32)
Calcium: 9.1 mg/dL (ref 8.9–10.3)
Creatinine, Ser: 0.75 mg/dL (ref 0.44–1.00)
GFR calc Af Amer: >60 mL/min (ref >60)
GFR calc non Af Amer: >60 mL/min (ref >60)
GLUCOSE: 95 mg/dL (ref 65–99)
POTASSIUM: 4.5 mmol/L (ref 3.5–5.1)
Sodium: 143 mmol/L (ref 135–145)

## 2017-09-11 LAB — AEROBIC/ANAEROBIC CULTURE W GRAM STAIN (SURGICAL/DEEP WOUND)

## 2017-09-11 LAB — AEROBIC/ANAEROBIC CULTURE (SURGICAL/DEEP WOUND): CULTURE: NO GROWTH

## 2017-09-11 MED ORDER — SODIUM CHLORIDE 0.9% FLUSH
10.0000 mL | INTRAVENOUS | Status: DC | PRN
  Administered 2017-09-12: 10 mL
  Filled 2017-09-11: qty 40

## 2017-09-11 MED ORDER — FLEET ENEMA 7-19 GM/118ML RE ENEM
1.0000 | ENEMA | Freq: Once | RECTAL | Status: AC
  Administered 2017-09-11: 1 via RECTAL
  Filled 2017-09-11: qty 1

## 2017-09-11 MED ORDER — FLUCONAZOLE 150 MG PO TABS
150.0000 mg | ORAL_TABLET | Freq: Once | ORAL | Status: AC
  Administered 2017-09-11: 150 mg via ORAL
  Filled 2017-09-11: qty 1

## 2017-09-11 NOTE — Progress Notes (Signed)
IV team notified of order for PICC placement. Infectious disease has cleared patient for PICC placement. Will notify IV team and continue to monitor patient status.

## 2017-09-11 NOTE — Progress Notes (Signed)
Peripherally Inserted Central Catheter/Midline Placement  The IV Nurse has discussed with the patient and/or persons authorized to consent for the patient, the purpose of this procedure and the potential benefits and risks involved with this procedure.  The benefits include less needle sticks, lab draws from the catheter, and the patient may be discharged home with the catheter. Risks include, but not limited to, infection, bleeding, blood clot (thrombus formation), and puncture of an artery; nerve damage and irregular heartbeat and possibility to perform a PICC exchange if needed/ordered by physician.  Alternatives to this procedure were also discussed.  Bard Power PICC patient education guide, fact sheet on infection prevention and patient information card has been provided to patient /or left at bedside.    PICC/Midline Placement Documentation        [name] 09/11/2017, 1:21 PM

## 2017-09-11 NOTE — Progress Notes (Signed)
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  IV antibiotics. Vancomycin and ceftriaxone.  ID following. Follow ID recommendation for antibiotics for discharge.  Blood culture 4-29; no growth in 24 hours.  Follow ID recommendation for timing for repeat CT chest    Focal pleural vs parenchymal abscess; Discussed with CVTS, [name], effusion is too small to drain. Patient will need follow Ct scan, as well as follow scan for T 8-T 9 osteomyelitis.  Chest x ray with small effusion.  Incentive spirometry for atelectasis.  On IV antibiotics.   Hypothyroidism;  Continue with synthroid.   Seizure disorder;  On keppra/   Relapsing remitting multiple sclerosis: - Hold ocrelizumab infusion -Continue baclofen 10 mg 3 times daily -Continue oxybutynin 5 mg nightly  Constipation;  KUB negative for obstruction.  Started on miralax and senna.  Suppository today.  Might need fleet enema also.   Chronic diarrhea: -holding  colestipol 1 g daily -Continue Lomotil as needed  Pain/psych: - Continue brexpiprazole 2 mg daily -Continue buspirone 10 mg 3 times daily -Continue celecoxib 100 mg twice daily -Continue duloxetine 30 mg nightly and 60 mg daily -Continue glycopyrrolate 2 mg daily -Hold intranasal ketorolac - Continue as needed Percocet -Continue 3 times daily pregabalin 150 mg - Continue suvorexant 20  mg daily -Continue tizanidine 6 mg 3 times daily  Constipation; start miralax. Senokot  Enema   DVT prophylaxis: scd Code Status: full code.  Family Communication: care discussed with patient and significant other at bedside Disposition Plan: home with home health , likely on 5/2 with Id clearance   Consultants:   ID  IR   Procedures:   IR guided drainage of paraspinal abscess on 09/06/2017  Echo 09/07/2017: negative for valve diseases.   picc line placement on 5/1     Antimicrobials:   IV vancomycin, ceftriaxone 4-26    Objective: BP 100/72 (BP Location: Left Arm)   Pulse (!) 56   Temp 98.3 F (36.8 C) (Oral)   Resp 16   Ht 5\' 7"  (1.702 m)   Wt 76.3 kg (168 lb 4.8 oz)   SpO2 97%   BMI 26.36 kg/m   Intake/Output Summary (Last 24 hours) at 09/11/2017 1106 Last data filed at 09/11/2017 0407 Gross per 24 hour  Intake 960 ml  Output -  Net 960 ml   Filed Weights   09/07/17 0534 09/08/17 0450 09/09/17 0517  Weight: 79.6 kg (175 lb 7.8 oz) 76.1 kg (167 lb 11.2 oz) 76.3 kg (168 lb 4.8 oz)    Exam: Patient is examined daily including today on 09/11/2017, exams remain the same as of yesterday except that has changed    General:  NAD  Cardiovascular: RRR  Respiratory: CTABL  Abdomen: Soft/ND/NT, positive BS  Musculoskeletal: No Edema  Neuro: alert, oriented   Data Reviewed: Basic Metabolic Panel: Recent Labs  Lab 09/05/17 1247 09/06/17 0501 09/08/17 1007 09/11/17 0305  NA 142 137 140 143  K  5.4* 5.0 5.0 4.5  CL 106 101 104 108  CO2 28 24 25 29   GLUCOSE 107* 96 94 95  BUN 14 20 15 14   CREATININE 0.83 0.78 0.78 0.75  CALCIUM 10.2 9.4 9.2 9.1   Liver Function Tests: Recent Labs  Lab 09/05/17 1247  AST 35  ALT 26  ALKPHOS 81  BILITOT 0.4  PROT 7.7  ALBUMIN 3.4*   No results for input(s): LIPASE, AMYLASE in the last 168 hours. No results for input(s): AMMONIA in the last 168 hours. CBC: Recent Labs  Lab 09/05/17 1247  09/06/17 0501 09/11/17 0305  WBC 9.8 9.8 6.2  NEUTROABS 7.3 6.3  --   HGB 13.7 12.0 11.4*  HCT 41.4 37.5 34.9*  MCV 100.7* 101.4* 99.4  PLT 340 305 203   Cardiac Enzymes:   No results for input(s): CKTOTAL, CKMB, CKMBINDEX, TROPONINI in the last 168 hours. BNP (last 3 results) No results for input(s): BNP in the last 8760 hours.  ProBNP (last 3 results) No results for input(s): PROBNP in the last 8760 hours.  CBG: No results for input(s): GLUCAP in the last 168 hours.  Recent Results (from the past 240 hour(s))  Aerobic/Anaerobic Culture (surgical/deep wound)     Status: None (Preliminary result)   Collection Time: 09/06/17  2:48 PM  Result Value Ref Range Status   Specimen Description ABSCESS  Final   Special Requests PARAVETEBRAL  Final   Gram Stain   Final    RARE WBC PRESENT,BOTH PMN AND MONONUCLEAR RARE GRAM POSITIVE COCCI    Culture   Final    NO GROWTH 4 DAYS NO ANAEROBES ISOLATED; CULTURE IN PROGRESS FOR 5 DAYS Performed at [facility], [address], [place], [place] [address]    Report Status PENDING  Incomplete  Acid Fast Smear (AFB)     Status: None   Collection Time: 09/06/17  3:27 PM  Result Value Ref Range Status   AFB Specimen Processing Concentration  Final   Acid Fast Smear Negative  Final    Comment: (NOTE) Performed At: [facility] [address] [place], [place] [id number] Jolene Schimke MD XB:[id number] Performed at [facility], [address] [place], [place] [address]    Source (AFB) ABSCESS  Final  Culture, blood (routine x 2)     Status: None (Preliminary result)   Collection Time: 09/09/17  3:30 PM  Result Value Ref Range Status   Specimen Description BLOOD RIGHT HAND  Final   Special Requests   Final    BOTTLES DRAWN AEROBIC ONLY Blood Culture adequate volume   Culture   Final    NO GROWTH < 24 HOURS Performed at [facility], [address], [place], [place] [address]    Report Status PENDING   Incomplete  Culture, blood (routine x 2)     Status: None (Preliminary result)   Collection Time: 09/09/17  3:35 PM  Result Value Ref Range Status   Specimen Description BLOOD LEFT ANTECUBITAL  Final   Special Requests   Final    BOTTLES DRAWN AEROBIC ONLY Blood Culture adequate volume   Culture   Final    NO GROWTH < 24 HOURS Performed at [facility], [address] [place], [place] [address]    Report Status PENDING  Incomplete     Studies: Dg Abd 1 View  Result Date: 09/10/2017 CLINICAL DATA:  History of multiple sclerosis, gastroenteritis, spinal infection. Recent constipation with subsequent cleanout  but now brief current constipation. EXAM: ABDOMEN - 1 VIEW COMPARISON:  None in PACs FINDINGS: The colonic stool burden is increased. There is no fecal impaction. There is no small or large bowel obstructive pattern. There are calcifications measuring up to 3 mm in diameter projecting over the left kidney. The bony structures are unremarkable. IMPRESSION: Increased colonic stool burden compatible with constipation in the appropriate clinical setting. No evidence of a small or large bowel obstruction. Electronically Signed   By: David  [place] M.[name].   On: 09/10/2017 13:20    Scheduled Meds: . amphetamine-dextroamphetamine  30 mg Oral BID  . baclofen  10 mg Oral TID  . Brexpiprazole  2 mg Oral Daily  . busPIRone  10 mg Oral TID  . DULoxetine  30 mg Oral QHS  . DULoxetine  60 mg Oral Daily  . fluconazole  150 mg Oral Once  . levETIRAcetam  1,500 mg Oral BID  . levothyroxine  100 mcg Oral QAC breakfast  . liothyronine  5 mcg Oral Daily  . magnesium oxide  400 mg Oral Daily  . multivitamin with minerals  1 tablet Oral Daily  . olopatadine  1 drop Both Eyes BID  . pantoprazole  40 mg Oral BID  . polyethylene glycol  17 g Oral BID  . pregabalin  150 mg Oral TID  . senna-docusate  1 tablet Oral BID  . sodium chloride flush  3 mL Intravenous Q12H  . Suvorexant  20 mg Oral QHS  .  tiZANidine  6 mg Oral TID    Continuous Infusions: . sodium chloride    . cefTRIAXone (ROCEPHIN)  IV Stopped (09/10/17 1737)  . vancomycin Stopped (09/11/17 0314)     Time spent: I have personally reviewed and interpreted on  09/11/2017 daily labs, tele strips, imagings as discussed above under date review session and assessment and plans.  I reviewed all nursing notes, pharmacy notes, consultant notes,  vitals, pertinent old records  I have discussed plan of care as described above with RN , patient and family on 09/11/2017   Albertine Grates MD, PhD  [facility] Pager [phone]. If 7PM-7AM, please contact night-coverage at [email or web address], password [facility] 09/11/2017, 11:06 AM  LOS: 6 days

## 2017-09-12 DIAGNOSIS — Z95828 Presence of other vascular implants and grafts: Secondary | ICD-10-CM

## 2017-09-12 LAB — VANCOMYCIN, TROUGH: VANCOMYCIN TR: 17 ug/mL (ref 15–20)

## 2017-09-12 MED ORDER — CEFTRIAXONE IV (FOR PTA / DISCHARGE USE ONLY): 2.0000 g | INTRAVENOUS | 0 refills | Status: DC

## 2017-09-12 MED ORDER — HEPARIN SOD (PORK) LOCK FLUSH 100 UNIT/ML IV SOLN
250.0000 [IU] | INTRAVENOUS | Status: AC | PRN
  Administered 2017-09-12: 250 [IU]

## 2017-09-12 MED ORDER — VANCOMYCIN IV (FOR PTA / DISCHARGE USE ONLY): 1500.0000 mg | Freq: Two times a day (BID) | INTRAVENOUS | 0 refills | Status: DC

## 2017-09-12 MED ORDER — HYDROMORPHONE HCL 2 MG PO TABS
1.0000 mg | ORAL_TABLET | Freq: Four times a day (QID) | ORAL | 0 refills | Status: AC | PRN
Start: 2017-09-12 — End: 2017-09-15

## 2017-09-12 NOTE — Progress Notes (Signed)
Pharmacy Antibiotic Note  [name] is a 47 y.o. female admitted on 09/05/2017 with fever and back pain.  Patient was recently diagnosed with paraspinal abscess.  Pharmacy has been consulted for vancomycin dosing for spinal osteomyelitis and abscess, s/p drainage by IR.  She is also on Rocephin.  Cultures no growth to date.  Renal function is stable and vancomycin trough is therapeutic.  Afebrile, WBC WNL.   Plan: Continue vanc 1500mg  IV Q12H Rocephin 2g IV Q24H Monitor renal fxn, clinical progress, weekly vanc trough   Height: 5\' 7"  (170.2 cm) Weight: 168 lb 4.8 oz (76.3 kg) IBW/kg (Calculated) : 61.6  Temp (24hrs), Avg:98.5 F (36.9 C), Min:98.2 F (36.8 C), Max:98.7 F (37.1 C)  Recent Labs  Lab 09/06/17 0501 09/08/17 1007 09/10/17 0407 09/11/17 0305  WBC 9.8  --   --  6.2  CREATININE 0.78 0.78  --  0.75  VANCOTROUGH  --   --  14*  --     Estimated Creatinine Clearance: 91.6 mL/min (by C-G formula based on SCr of 0.75 mg/dL).    Allergies  Allergen Reactions  . Levaquin [Levofloxacin In D5w] Shortness Of Breath  . Bactrim [Sulfamethoxazole-Trimethoprim] Other (See Comments)    "really red all over, hot skinned"  . Doxycycline Hives  . Other     Seasonal allergies  . Latex Rash     Vanc 4/26 >> CTX 4/26 >>  4/30 VT = 14 mcg/mL, true trough 11 mcg/mL d/t lab/dose timing, on 1g q12 >> 1500mg  q12 5/2 VT = 17 mcg/mL >> con't 1500mg  q12  4/26 Acid fast smear: sent 4/26 Paravetebral abscess - GPC on Gram stain >> negative culture 4/26 Hep A / B / C - negative 4/26 HIV - negative 4/29 BCx -  NGTD   Taimi Towe D. Laney Potash, PharmD, BCPS, BCCCP Pager:  [phone] 09/12/2017, 2:38 PM

## 2017-09-12 NOTE — Discharge Summary (Signed)
Discharge Summary  [name] LNL:[id number] DOB: 1971/03/31  PCP: [name] Polio, NP  Admit date: 09/05/2017 Discharge date: 09/12/2017  Time spent: 63mns, more than 50% time spent on coordination of care  Recommendations for Outpatient Follow-up:  1. F/u with PMD within a week  for hospital discharge follow up, repeat cbc/bmp at follow up 2. F/u with infectious disease  3. F/u with neurology 4. F/u with pain clinic 5. Home health arranged  Discharge Diagnoses:  Active Hospital Problems   Diagnosis Date Noted  . 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 Hypothyroidism 09/05/2017  . Chronic pain 09/05/2017  . Depression with anxiety 01/07/2013  . Multiple sclerosis ([facility] 09/17/2012    Resolved Hospital Problems  No resolved problems to display.    Discharge Condition: stable  Diet recommendation: regular diet  Filed Weights   09/07/17 0534 09/08/17 0450 09/09/17 0517  Weight: 79.6 kg (175 lb 7.8 oz) 76.1 kg (167 lb 11.2 oz) 76.3 kg (168 lb 4.8 oz)    History of present illness:  PCP: GNena Polio NP   Patient coming from: Home  Chief Complaint: Fevers, mid-back pain   HPI: [name] Hancoxis a 47y.o. female with medical history significant for multiple sclerosis, depression with anxiety, hypothyroidism, and chronic pain, now presenting to the emergency department for evaluation of fevers at home and increased pain in her mid back.  Patient reports increased back pain since 08/20/2017, [facility], underwent MRI that revealed a left paraspinal fluid collection with surrounding inflammatory changes concerning for possible abscess.  She was given antibiotics while in the hospital, IR was consulted but did not feel that it needed to be drained at that time, and the patient left the hospital AMA on 08/28/2017.  She has not taken antibiotics since leaving the hospital.  She reports continued  fevers at home and continued pain.  She has had constipation, but no saddle anesthesia, lower extremity weakness, or incontinence.  Denies IV drug use.  ED Course: Upon arrival to the ED, patient is found to be afebrile, saturating well on room air, and with vitals otherwise normal.  Chemistry panel is notable for a potassium of 5.4 and CBC features a mild macrocytosis without anemia.  Lactic acid is normal.  Neurosurgery was consulted by the ED physician, reviewed the MRI from 08/28/2017, noted that there was no epidural involvement requiring neurosurgery.  Infectious disease was consulted by the ED physician and recommended MRI, IR drainage of any fluid collection, and hold antibiotics for now.  Patient was treated with 0.5 mg IV Dilaudid x2 in the ED.  She remains hemodynamically stable, in no apparent respiratory distress, and will be admitted to the medical-surgical unit for ongoing evaluation and management paraspinal fluid collection, possibly abscess.     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; T 4-T5 facet septic arthropathy;  -S/P IR aspiration of para-spinal abscess 4-26, gram stain with rare gram +cocci, but culture so far no growth -blood culture no growth -Follow ID recommendation for timing for repeat CT chest /spine imaging  Discharge antibiotics: ceftriaxone 2g IVPB qday Per pharmacy protocol vancomycin Aim for Vancomycin trough 15-20 (unless otherwise indicated) Duration: 36 days End Date: October 18, 2017  [facility]:  please  Labs weekly while on IV antibiotics: _x_ CBC with differential __ BMP _x_ CMP _x_ CRP _x_ ESR _x_ Vancomycin trough  __x Please pull PIC at completion of IV antibiotics __ Please leave PIC in place until doctor has seen patient  or been notified  Fax weekly labs to [phone]  Clinic Follow Up Appt: [name] 4 weeks.                                                                                       [name] MD, FACP Infectious Diseases (pager) [phone] [email or web address]    Focal pleural vs parenchymal abscess; My colleague Discussed with CVTS, [name], effusion is too small to drain. Patient will need follow Ct scan, as well as follow scan for T 8-T 9 osteomyelitis.  Chest x ray with small effusion.  Incentive spirometry for atelectasis. On IV antibiotics. Patient will follow up with infectious disease in terms of follow up imaging.  Hypothyroidism;  Continue with synthroid.   Seizure disorder;  On keppra  Relapsing remitting multiple sclerosis: -Hold ocrelizumab infusion -Continue baclofen 10 mg 3 times daily -Continue oxybutynin 5 mg nightly -f/u with neurology  Constipation;  KUB negative for obstruction.  Started on miralax and senna.  Suppository today.  Might need fleet enema also.  Chronic diarrhea: -holdingcolestipol 1 g daily -Continue Lomotil as needed  Pain/psych: -Continue brexpiprazole 2 mg daily -Continue buspirone 10 mg 3 times daily -Continue celecoxib 100 mg twice daily -Continue duloxetine 30 mg nightly and 60 mg daily -Continue glycopyrrolate 2 mg daily -Hold intranasal ketorolac -Continue as needed Percocet -Continue 3 times daily pregabalin 150 mg -Continue suvorexant 20 mg daily -Continue tizanidine 6 mg 3 times daily  Constipation; start miralax. Senokot  Enema   DVT prophylaxis:scd Code Status:full code.  Family Communication:care discussed with patient  Disposition Plan:home with home health  on 5/2 with ID clearance   Consultants:  ID  IR   Procedures:  IR guided drainage of paraspinal abscess on 09/06/2017  Echo 09/07/2017: negative for valve diseases.   picc line placement on  5/1     Antimicrobials:   IV vancomycin, ceftriaxone 4-26   Discharge Exam: BP 126/72 (BP Location: Left Arm)   Pulse 70   Temp 98.7 F (37.1 C) (Oral)   Resp 16   Ht '5\' 7"'  (1.702 m)   Wt 76.3 kg (168 lb 4.8 oz)   SpO2 100%   BMI 26.36 kg/m   General: NAD Cardiovascular: RRR Respiratory: CTABL  Discharge Instructions You were cared for by a hospitalist during your hospital stay. If you have any questions about your discharge medications or the care you received while you were in the hospital after you are discharged, you can call the unit and asked to speak with the hospitalist on call if the hospitalist that took care of you is not available. Once you are discharged, your primary care physician will handle any further medical issues. Please note that NO REFILLS for any discharge medications will be authorized once you are discharged, as it is imperative that you return to your primary care physician (or establish a relationship with a  primary care physician if you do not have one) for your aftercare needs so that they can reassess your need for medications and monitor your lab values.  Discharge Instructions    Diet general   Complete by:  As directed    Home infusion instructions [facility] May follow [place] Dosing Protocol; May administer Cathflo as needed to maintain patency of vascular access device.; Flushing of vascular access device: per [facility] Protocol: 0.9% NaCl pre/post medica...   Complete by:  As directed    Instructions:  May follow [place] Dosing Protocol   Instructions:  May administer Cathflo as needed to maintain patency of vascular access device.   Instructions:  Flushing of vascular access device: per [facility] Protocol: 0.9% NaCl pre/post medication administration and prn patency; Heparin 100 u/ml, 26m for implanted ports and Heparin 10u/ml, 555mfor all other central venous catheters.   Instructions:  May follow [facility] Anaphylaxis Protocol for  First Dose Administration in the home: 0.9% NaCl at 25-50 ml/hr to maintain IV access for protocol meds. Epinephrine 0.3 ml IV/IM PRN and Benadryl 25-50 IV/IM PRN s/s of anaphylaxis.   Instructions:  Ad[facility]enfusion Coordinator (RN) to assist per patient IV care needs in the home PRN.   Increase activity slowly   Complete by:  As directed      Allergies as of 09/12/2017      Reactions   Levaquin [levofloxacin In D5w] Shortness Of Breath   Bactrim [sulfamethoxazole-trimethoprim] Other (See Comments)   "really red all over, hot skinned"   Doxycycline Hives   Other    Seasonal allergies   Latex Rash      Medication List    STOP taking these medications   ocrelizumab 300 MG/10ML injection Commonly known as:  OCREVUS     TAKE these medications   albuterol (2.5 MG/3ML) 0.083% nebulizer solution Commonly known as:  PROVENTIL As needed   amphetamine-dextroamphetamine 30 MG tablet Commonly known as:  ADDERALL Take 30 mg by mouth 2 (two) times daily.   baclofen 10 MG tablet Commonly known as:  LIORESAL Take 10 mg by mouth 3 (three) times daily.   BELSOMRA 20 MG Tabs Generic drug:  Suvorexant Take 20 mg by mouth daily.   Biotin 5000 MCG Caps Take 1 capsule by mouth daily.   busPIRone 10 MG tablet Commonly known as:  BUSPAR 10 mg 3 (three) times daily.   cefTRIAXone IVPB Commonly known as:  ROCEPHIN Inject 2 g into the vein daily. Indication:  Spinal osteo / abscess Last Day of Therapy:  10/18/17 Labs - Once weekly:  CBC/D and BMP, Labs - Every other week:  ESR and CRP   celecoxib 100 MG capsule Commonly known as:  CELEBREX 100 mg 2 (two) times daily.   cetirizine 10 MG tablet Commonly known as:  ZYRTEC Take 10 mg by mouth daily.   colestipol 1 g tablet Commonly known as:  COLESTID Take 1 g by mouth daily.   diphenoxylate-atropine 2.5-0.025 MG tablet Commonly known as:  LOMOTIL 2.5-0.025,mg as needed   DULoxetine 60 MG capsule Commonly known as:   CYMBALTA Take 60 mg by mouth daily.   DULoxetine 30 MG capsule Commonly known as:  CYMBALTA Take 30 mg by mouth at bedtime.   fluticasone 50 MCG/ACT nasal spray Commonly known as:  FLONASE Place 2 sprays into both nostrils daily. 50 mcg   furosemide 40 MG tablet Commonly known as:  LASIX Take 40 mg by mouth daily.  glycopyrrolate 2 MG tablet Commonly known as:  ROBINUL Take 2 mg by mouth daily.   HYDROmorphone 2 MG tablet Commonly known as:  DILAUDID Take 0.5 tablets (1 mg total) by mouth every 6 (six) hours as needed for up to 3 days for severe pain.   levETIRAcetam 750 MG tablet Commonly known as:  KEPPRA 1,500 mg 2 (two) times daily.   liothyronine 5 MCG tablet Commonly known as:  CYTOMEL Take 5 mcg by mouth daily.   magnesium oxide 400 MG tablet Commonly known as:  MAG-OX Take 400 mg by mouth daily.   multivitamin with minerals tablet Take 1 tablet by mouth daily.   olopatadine 0.1 % ophthalmic solution Commonly known as:  PATANOL PLACE ONE DROP INTO BOTH EYES 2 (TWO) TIMES DAILY.   ondansetron 8 MG tablet Commonly known as:  ZOFRAN Take 8 mg by mouth as needed.   oxybutynin 5 MG 24 hr tablet Commonly known as:  DITROPAN-XL Take 1 tablet (5 mg total) by mouth at bedtime.   oxyCODONE-acetaminophen 10-325 MG tablet Commonly known as:  PERCOCET Take 1 tablet by mouth every 4 (four) hours as needed for pain.   pregabalin 150 MG capsule Commonly known as:  LYRICA Take 150 mg by mouth 3 (three) times daily.   promethazine 25 MG tablet Commonly known as:  PHENERGAN Take 25 mg by mouth every 4 (four) hours as needed.   RABEprazole 20 MG tablet Commonly known as:  ACIPHEX Take 20 mg by mouth 2 (two) times daily before a meal.   REXULTI 2 MG Tabs Generic drug:  Brexpiprazole Take 2 mg by mouth daily.   SPRIX 15.75 MG/SPRAY Soln Generic drug:  Ketorolac Tromethamine Place 1 spray into the nose every 6 (six) hours as needed.   SYNTHROID 100 MCG  tablet Generic drug:  levothyroxine Take 100 mcg by mouth daily. Can not take generic ( MUST USE BRAND ONLY)   tizanidine 6 MG capsule Commonly known as:  ZANAFLEX Take 6 mg by mouth 3 (three) times daily.   valACYclovir 1000 MG tablet Commonly known as:  VALTREX TAKE 2 TABS AT ONSET AND 2 TABS 12 HOURS LATER X1DAY FOR EACH FLARE   vancomycin IVPB Inject 1,500 mg into the vein every 12 (twelve) hours. Indication:  Spinal osteo / abscess Last Day of Therapy:  10/18/17 Labs - Sunday/Monday:  CBC/D, BMP, and vancomycin trough. Labs - Thursday:  BMP and vancomycin trough Labs - Every other week:  ESR and CRP            Home Infusion Instuctions  (From admission, onward)        Start     Ordered   09/12/17 0000  Home infusion instructions [facility] May follow [place] Dosing Protocol; May administer Cathflo as needed to maintain patency of vascular access device.; Flushing of vascular access device: per [facility] Protocol: 0.9% NaCl pre/post medica...    Question Answer Comment  Instructions May follow [place] Dosing Protocol   Instructions May administer Cathflo as needed to maintain patency of vascular access device.   Instructions Flushing of vascular access device: per [facility] Protocol: 0.9% NaCl pre/post medication administration and prn patency; Heparin 100 u/ml, 28m for implanted ports and Heparin 10u/ml, 587mfor all other central venous catheters.   Instructions May follow [facility] Anaphylaxis Protocol for First Dose Administration in the home: 0.9% NaCl at 25-50 ml/hr to maintain IV access for protocol meds. Epinephrine 0.3 ml IV/IM PRN and Benadryl 25-50 IV/IM  PRN s/s of anaphylaxis.   Instructions [facility] Infusion Coordinator (RN) to assist per patient IV care needs in the home PRN.      09/12/17 1616     Allergies  Allergen Reactions  . Levaquin [Levofloxacin In D5w] Shortness Of Breath  . Bactrim [Sulfamethoxazole-Trimethoprim] Other (See Comments)     "really red all over, hot skinned"  . Doxycycline Hives  . Other     Seasonal allergies  . Latex Rash   Follow-up Information    [name] Polio, NP Follow up in 1 week(s).   Specialty:  Family Medicine Why:  hospital discharge follow up Contact information: [address] [place] [address] [phone]        Campbell Riches, MD Follow up in 4 week(s).   Specialty:  Infectious Diseases Contact information: [address]  [place] [address] [place], [place] [facility].   Contact information: [address] [place] [address] [phone]            The results of significant diagnostics from this hospitalization (including imaging, microbiology, ancillary and laboratory) are listed below for reference.    Significant Diagnostic Studies: Dg Chest 2 View  Result Date: 09/09/2017 CLINICAL DATA:  Shortness of breath, chest pain EXAM: CHEST - 2 VIEW COMPARISON:  Chest CT 09/06/2017 FINDINGS: Increasing left basilar atelectasis or infiltrate. Small left pleural effusion. No pneumothorax. Right lung is clear. Heart is normal size. IMPRESSION: Increasing left basilar atelectasis or infiltrate. Small left effusion. Electronically Signed   By: Rolm Baptise [name]   On: 09/09/2017 08:00   Dg Abd 1 View  Result Date: 09/10/2017 CLINICAL DATA:  History of multiple sclerosis, gastroenteritis, spinal infection. Recent constipation with subsequent cleanout but now brief current constipation. EXAM: ABDOMEN - 1 VIEW COMPARISON:  None in PACs FINDINGS: The colonic stool burden is increased. There is no fecal impaction. There is no small or large bowel obstructive pattern. There are calcifications measuring up to 3 mm in diameter projecting over the left kidney. The bony structures are unremarkable. IMPRESSION: Increased colonic stool burden compatible with constipation in the appropriate clinical setting. No  evidence of a small or large bowel obstruction. Electronically Signed   By: David  Martinique [name]   On: 09/10/2017 13:20   Ct Chest W Contrast  Result Date: 09/06/2017 CLINICAL DATA:  Complicated pneumonia. Abscess seen on yesterday's MRI. EXAM: CT CHEST WITH CONTRAST TECHNIQUE: Multidetector CT imaging of the chest was performed during intravenous contrast administration. CONTRAST:  173m ISOVUE-300 IOPAMIDOL (ISOVUE-300) INJECTION 61% COMPARISON:  MRI thoracic and lumbar spine September 05, 2017 FINDINGS: CARDIOVASCULAR: Heart and pericardium are unremarkable. Thoracic aorta is normal course and caliber, unremarkable. MEDIASTINUM/NODES: No mediastinal mass. No lymphadenopathy by CT size criteria. Normal appearance of thoracic esophagus though not tailored for evaluation. LUNGS/PLEURA: Tracheobronchial tree is patent, no pneumothorax. Small LEFT pleural effusion with subpulmonic component. Lobulated pleural collection measuring to 2.1 cm corresponding to known abscess. UPPER ABDOMEN: Nonacute. 3 mm LEFT interpolar nephrolithiasis. 13 mm cyst LEFT lobe of the liver. MUSCULOSKELETAL: LEFT posterior nondisplaced T8 and T9 rib fractures with associated lytic component T9 (series 5, image 84). Mild asymmetrically prominent LEFT paraspinal muscles without discrete fluid collection by CT though, demonstrated on yesterday's MRI. IMPRESSION: 1. Nondisplaced LEFT T8 and T9 fractures with lytic component consistent with pathologic fracture and osteomyelitis. Associated 2.1 cm focal pleural versus parenchymal abscess and small empyema better  demonstrated on yesterday's MRI. 2. Mildly prominent LEFT paraspinal muscles corresponding to known abscess. 3. 3 mm nonobstructing LEFT nephrolithiasis. Electronically Signed   By: Elon Alas [name]   On: 09/06/2017 04:08   Mr Thoracic Spine W Wo Contrast  Result Date: 09/06/2017 CLINICAL DATA:  Persistent fevers. History of spinal abscess and bony destruction. EXAM: MRI THORACIC  AND LUMBAR SPINE WITHOUT AND WITH CONTRAST TECHNIQUE: Multiplanar and multiecho pulse sequences of the thoracic and lumbar spine were obtained without and with intravenous contrast. CONTRAST:  37m MULTIHANCE GADOBENATE DIMEGLUMINE 529 MG/ML IV SOLN COMPARISON:  MRI thoracic spine Sep 29, 2012 and MRI lumbar spine May 09, 2011 FINDINGS: MRI THORACIC SPINE FINDINGS ALIGNMENT: Maintenance of the thoracic kyphosis. No malalignment. VERTEBRAE/DISCS: Vertebral bodies are intact. Intervertebral discs morphology and signal are normal. No abnormal disc enhancement. Mild bright STIR signal and enhancement bilateral T4-5 facets. LEFT ninth rib abnormal enhancement with suspected fracture. Associated 14 x 11 mm pleural to sub pleural fluid collection and, an adjacent 4 x 13 mm suspected empyema (axial 30/39). CORD: Thoracic spinal cord is normal morphology and signal characteristics. No abnormal cord, leptomeningeal or epidural enhancement. PREVERTEBRAL AND PARASPINAL SOFT TISSUES: Small LEFT pleural effusion. Mild LEFT lower thoracic paraspinal interstitial edema and enhancement. DISC LEVELS: No disc bulge, canal stenosis nor neural foraminal narrowing. MRI LUMBAR SPINE FINDINGS SEGMENTATION: For the purposes of this report, the last well-formed intervertebral disc is reported as L5-S1. ALIGNMENT: Maintained lumbar lordosis. No malalignment. VERTEBRAE: Vertebral bodies are intact. Mild similar L5-S1 disc height loss, minimal L3-4 and L4-5 with disc desiccation and mild subacute discogenic endplate changes. No acute or abnormal bone marrow signal. No abnormal osseous or disc enhancement. No abnormal bone marrow signal. No abnormal osseous or intradiscal enhancement. CONUS MEDULLARIS AND CAUDA EQUINA: Conus medullaris terminates at L1-2 and demonstrates normal morphology and signal characteristics. Cauda equina is normal. No abnormal cord, leptomeningeal or epidural enhancement. PARASPINAL AND OTHER SOFT TISSUES: 1.1 x 4.5  x 7.9 cm (AP by transverse by CC) LEFT paraspinal subfascial abscess from T11-12 to L2, superficial to paraspinal muscles. DISC LEVELS: T12-L1, L1-2, L2-3: No disc bulge, canal stenosis nor neural foraminal narrowing. L3-4: Similar 4 mm broad-based disc bulge/central disc protrusion with enhancing annular fissure. No canal stenosis or neural foraminal narrowing. L4-5: Similar 5 mm broad-based disc bulge/LEFT central disc protrusion with enhancing annular fissure. Mild canal stenosis. No neural foraminal narrowing. L5-S1: Similar small central disc protrusion with 8 mm contiguous inferior migration and enhancement. Small broad-based disc bulge. Mild facet arthropathy without canal stenosis. Mild LEFT neural foraminal narrowing. IMPRESSION: MRI thoracic spine: 1. 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 Small LEFT pleural effusion and trace suspected empyema. Findings would be better demonstrated on CT chest with contrast. 2. LEFT paraspinal myositis. 3. Mild symmetric T4-5 facet edema favoring inflammatory changes, less likely infection/septic arthropathy. MRI lumbar spine: 1. 1.1 x 4.5 x 7.9 cm LEFT superficial paraspinal abscess. 2. No discitis, osteomyelitis or epidural abscess. 3. Stable degenerative change of the lumbar spine. No canal stenosis. Mild LEFT L5-S1 neural foraminal narrowing. These results will be called to the ordering clinician or representative by the Radiologist Assistant, and communication documented in the PACS or zVision Dashboard. Electronically Signed   By: CElon AlasM.D.   On: 09/06/2017 01:38   Mr Lumbar Spine W Wo Contrast  Result Date: 09/06/2017 CLINICAL DATA:  Persistent fevers. History of spinal abscess and bony destruction. EXAM: MRI THORACIC AND  LUMBAR SPINE WITHOUT AND WITH CONTRAST TECHNIQUE: Multiplanar and multiecho pulse sequences of the thoracic and lumbar spine were obtained without and with intravenous  contrast. CONTRAST:  29m MULTIHANCE GADOBENATE DIMEGLUMINE 529 MG/ML IV SOLN COMPARISON:  MRI thoracic spine Sep 29, 2012 and MRI lumbar spine May 09, 2011 FINDINGS: MRI THORACIC SPINE FINDINGS ALIGNMENT: Maintenance of the thoracic kyphosis. No malalignment. VERTEBRAE/DISCS: Vertebral bodies are intact. Intervertebral discs morphology and signal are normal. No abnormal disc enhancement. Mild bright STIR signal and enhancement bilateral T4-5 facets. LEFT ninth rib abnormal enhancement with suspected fracture. Associated 14 x 11 mm pleural to sub pleural fluid collection and, an adjacent 4 x 13 mm suspected empyema (axial 30/39). CORD: Thoracic spinal cord is normal morphology and signal characteristics. No abnormal cord, leptomeningeal or epidural enhancement. PREVERTEBRAL AND PARASPINAL SOFT TISSUES: Small LEFT pleural effusion. Mild LEFT lower thoracic paraspinal interstitial edema and enhancement. DISC LEVELS: No disc bulge, canal stenosis nor neural foraminal narrowing. MRI LUMBAR SPINE FINDINGS SEGMENTATION: For the purposes of this report, the last well-formed intervertebral disc is reported as L5-S1. ALIGNMENT: Maintained lumbar lordosis. No malalignment. VERTEBRAE: Vertebral bodies are intact. Mild similar L5-S1 disc height loss, minimal L3-4 and L4-5 with disc desiccation and mild subacute discogenic endplate changes. No acute or abnormal bone marrow signal. No abnormal osseous or disc enhancement. No abnormal bone marrow signal. No abnormal osseous or intradiscal enhancement. CONUS MEDULLARIS AND CAUDA EQUINA: Conus medullaris terminates at L1-2 and demonstrates normal morphology and signal characteristics. Cauda equina is normal. No abnormal cord, leptomeningeal or epidural enhancement. PARASPINAL AND OTHER SOFT TISSUES: 1.1 x 4.5 x 7.9 cm (AP by transverse by CC) LEFT paraspinal subfascial abscess from T11-12 to L2, superficial to paraspinal muscles. DISC LEVELS: T12-L1, L1-2, L2-3: No disc bulge,  canal stenosis nor neural foraminal narrowing. L3-4: Similar 4 mm broad-based disc bulge/central disc protrusion with enhancing annular fissure. No canal stenosis or neural foraminal narrowing. L4-5: Similar 5 mm broad-based disc bulge/LEFT central disc protrusion with enhancing annular fissure. Mild canal stenosis. No neural foraminal narrowing. L5-S1: Similar small central disc protrusion with 8 mm contiguous inferior migration and enhancement. Small broad-based disc bulge. Mild facet arthropathy without canal stenosis. Mild LEFT neural foraminal narrowing. IMPRESSION: MRI thoracic spine: 1. 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 Small LEFT pleural effusion and trace suspected empyema. Findings would be better demonstrated on CT chest with contrast. 2. LEFT paraspinal myositis. 3. Mild symmetric T4-5 facet edema favoring inflammatory changes, less likely infection/septic arthropathy. MRI lumbar spine: 1. 1.1 x 4.5 x 7.9 cm LEFT superficial paraspinal abscess. 2. No discitis, osteomyelitis or epidural abscess. 3. Stable degenerative change of the lumbar spine. No canal stenosis. Mild LEFT L5-S1 neural foraminal narrowing. These results will be called to the ordering clinician or representative by the Radiologist Assistant, and communication documented in the PACS or zVision Dashboard. Electronically Signed   By: CElon AlasM.D.   On: 09/06/2017 01:38   UK[place]Guided Needle Placement  Result Date: 09/06/2017 CLINICAL DATA:  History of spinal infection. Recent MR demonstrates left superficial paraspinal abscess. EXAM: IR ULTRASOUND GUIDED ASPIRATION COMPARISON:  MR 09/05/2017 TECHNIQUE: The procedure, risks (including but not limited to bleeding, infection, organ damage ), benefits, and alternatives were explained to the patient. Questions regarding the procedure were encouraged and answered. The patient understands and consents to the procedure.  Intravenous Fentanyl and Versed were administered as conscious sedation during continuous monitoring of the patient's level of consciousness  and physiological / cardiorespiratory status by the radiology RN, with a total moderate sedation time of 10 minutes. Survey ultrasound of the left paraspinal region was performed and the linear fluid collection was localized. Site was marked, prepped with chlorhexidine, draped in usual sterile fashion, infiltrated locally with 1% lidocaine. Under real-time ultrasound guidance, an 18 gauge spinal needle was advanced into the collection. Approximately 1 mL of turbid serosanguineous fluid was aspirated, sent for Gram stain and culture. Postprocedure scans show no hemorrhage or other apparent complication. The patient tolerated the procedure well. IMPRESSION: 1. Technically successful aspiration of left superficial lumbar paraspinal abscess, sample sent for Gram stain and culture. Electronically Signed   By: Lucrezia Europe [name]   On: 09/06/2017 15:48   [place] Ekg Site Rite  Result Date: 09/09/2017 If Site Rite image not attached, placement could not be confirmed due to current cardiac rhythm.   Microbiology: Recent Results (from the past 240 hour(s))  Aerobic/Anaerobic Culture (surgical/deep wound)     Status: None   Collection Time: 09/06/17  2:48 PM  Result Value Ref Range Status   Specimen Description ABSCESS  Final   Special Requests PARAVETEBRAL  Final   Gram Stain   Final    RARE WBC PRESENT,BOTH PMN AND MONONUCLEAR RARE GRAM POSITIVE COCCI    Culture   Final    No growth aerobically or anaerobically. Performed at [facility], [place] [address] [place], [place] [address]    Report Status 09/11/2017 FINAL  Final  Acid Fast Smear (AFB)     Status: None   Collection Time: 09/06/17  3:27 PM  Result Value Ref Range Status   AFB Specimen Processing Concentration  Final   Acid Fast Smear Negative  Final    Comment: (NOTE) Performed At: [facility] [place], [place] [id number] Rush Farmer MD [id number] Performed at [facility], [place] [address], [place], [place] [address]    Source (AFB) ABSCESS  Final  Culture, blood (routine x 2)     Status: None (Preliminary result)   Collection Time: 09/09/17  3:30 PM  Result Value Ref Range Status   Specimen Description BLOOD RIGHT HAND  Final   Special Requests   Final    BOTTLES DRAWN AEROBIC ONLY Blood Culture adequate volume   Culture   Final    NO GROWTH 3 DAYS Performed at [facility], [place] [address], [place], [place] [address]    Report Status PENDING  Incomplete  Culture, blood (routine x 2)     Status: None (Preliminary result)   Collection Time: 09/09/17  3:35 PM  Result Value Ref Range Status   Specimen Description BLOOD LEFT ANTECUBITAL  Final   Special Requests   Final    BOTTLES DRAWN AEROBIC ONLY Blood Culture adequate volume   Culture   Final    NO GROWTH 3 DAYS Performed at [facility], [address], [place], [place] [address]    Report Status PENDING  Incomplete     Labs: Basic Metabolic Panel: Recent Labs  Lab 09/06/17 0501 09/08/17 1007 09/11/17 0305  NA 137 140 143  K 5.0 5.0 4.5  CL 101 104 108  CO2 '24 25 29  ' GLUCOSE 96 94 95  BUN '20 15 14  ' CREATININE 0.78 0.78 0.75  CALCIUM 9.4 9.2 9.1   Liver Function Tests: No results for input(s): AST, ALT, ALKPHOS, BILITOT, PROT, ALBUMIN in the last 168 hours. No results for  input(s): LIPASE, AMYLASE in the last 168 hours. No results for input(s): AMMONIA in the last 168 hours. CBC: Recent Labs  Lab 09/06/17 0501 09/11/17 0305  WBC 9.8 6.2  NEUTROABS 6.3  --   HGB 12.0 11.4*  HCT 37.5 34.9*  MCV 101.4* 99.4  PLT 305 203   Cardiac Enzymes: No results for input(s): CKTOTAL, CKMB, CKMBINDEX, TROPONINI in the last 168 hours. BNP: BNP (last 3 results) No results for input(s): BNP in the last 8760 hours.  ProBNP (last 3 results) No results for  input(s): PROBNP in the last 8760 hours.  CBG: No results for input(s): GLUCAP in the last 168 hours.     Signed:  Florencia Reasons MD, PhD  [facility] 09/12/2017, 4:37 PM

## 2017-09-12 NOTE — Progress Notes (Signed)
Patient discharged to home with instructions and prescriptions. 

## 2017-09-12 NOTE — Care Management Note (Addendum)
Case Management Note  Patient Details  Name: [name] MRN: [id number] Date of Birth: 1970/07/23  Subjective/Objective:                    Action/Plan: [facility] aware DC is today. Awaiting Vancomycin level for prescription Discussed IV ABX at home with patient. Patient aware she will be shown how to administer IV ABX.   Confirmed face sheet information with patient including PCP.   Will need home health RN order  Expected Discharge Date:                  Expected Discharge Plan:  Home w Home Health Services  In-House Referral:     Discharge planning Services  CM Consult  Post Acute Care Choice:  Home Health Choice offered to:  Patient  DME Arranged:  N/A DME Agency:  NA  HH Arranged:    HH Agency:  [facility]  Status of Service:  In process, will continue to follow  If discussed at Long Length of Stay Meetings, dates discussed:    Additional Comments:  Kingsley Plan, RN 09/12/2017, 10:00 AM

## 2017-09-12 NOTE — Progress Notes (Addendum)
INFECTIOUS DISEASE PROGRESS NOTE  ID: [name] is a 47 y.o. 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[place]) Active Problems:   Multiple sclerosis ([place])   Depression with anxiety   Hypothyroidism   Chronic pain  Subjective: No complaints  Abtx:  Anti-infectives (From admission, onward)   Start     Dose/Rate Route Frequency Ordered Stop   09/11/17 1100  fluconazole (DIFLUCAN) tablet 150 mg     150 mg Oral  Once 09/11/17 1049 09/11/17 1438   09/10/17 1400  vancomycin (VANCOCIN) 1,500 mg in sodium chloride 0.9 % 500 mL IVPB     1,500 mg 250 mL/hr over 120 Minutes Intravenous Every 12 hours 09/10/17 0530     09/07/17 0500  vancomycin (VANCOCIN) IVPB 1000 mg/200 mL premix  Status:  Discontinued     1,000 mg 200 mL/hr over 60 Minutes Intravenous Every 12 hours 09/06/17 1616 09/10/17 0529   09/06/17 1700  cefTRIAXone (ROCEPHIN) 2 g in sodium chloride 0.9 % 100 mL IVPB     2 g 200 mL/hr over 30 Minutes Intravenous Every 24 hours 09/06/17 1603     09/06/17 1700  vancomycin (VANCOCIN) 1,250 mg in sodium chloride 0.9 % 250 mL IVPB     1,250 mg 166.7 mL/hr over 90 Minutes Intravenous  Once 09/06/17 1616 09/06/17 1949      Medications:  Scheduled: . amphetamine-dextroamphetamine  30 mg Oral BID  . baclofen  10 mg Oral TID  . Brexpiprazole  2 mg Oral Daily  . busPIRone  10 mg Oral TID  . DULoxetine  30 mg Oral QHS  . DULoxetine  60 mg Oral Daily  . levETIRAcetam  1,500 mg Oral BID  . levothyroxine  100 mcg Oral QAC breakfast  . liothyronine  5 mcg Oral Daily  . magnesium oxide  400 mg Oral Daily  . multivitamin with minerals  1 tablet Oral Daily  . olopatadine  1 drop Both Eyes BID  . pantoprazole  40 mg Oral BID  . polyethylene glycol  17 g Oral BID  . pregabalin  150 mg Oral TID  . senna-docusate  1 tablet Oral BID  . sodium chloride flush  3 mL Intravenous Q12H  . Suvorexant  20 mg Oral QHS  . tiZANidine  6 mg Oral TID    Objective: Vital signs in  last 24 hours: Temp:  [98.2 F (36.8 C)-98.7 F (37.1 C)] 98.7 F (37.1 C) (05/02 0602) Pulse Rate:  [60-70] 70 (05/02 0602) Resp:  [16-17] 16 (05/02 0602) BP: (115-126)/(72-85) 126/72 (05/02 0602) SpO2:  [97 %-100 %] 100 % (05/02 0602)   General appearance: alert, cooperative and no distress RUE PIC.   Lab Results Recent Labs    09/11/17 0305  WBC 6.2  HGB 11.4*  HCT 34.9*  NA 143  K 4.5  CL 108  CO2 29  BUN 14  CREATININE 0.75   Liver Panel No results for input(s): PROT, ALBUMIN, AST, ALT, ALKPHOS, BILITOT, BILIDIR, IBILI in the last 72 hours. Sedimentation Rate No results for input(s): ESRSEDRATE in the last 72 hours. C-Reactive Protein No results for input(s): CRP in the last 72 hours.  Microbiology: Recent Results (from the past 240 hour(s))  Aerobic/Anaerobic Culture (surgical/deep wound)     Status: None   Collection Time: 09/06/17  2:48 PM  Result Value Ref Range Status   Specimen Description ABSCESS  Final   Special Requests PARAVETEBRAL  Final   Gram Stain   Final  RARE WBC PRESENT,BOTH PMN AND MONONUCLEAR RARE GRAM POSITIVE COCCI    Culture   Final    No growth aerobically or anaerobically. Performed at [facility], [place] [address] [place], [place] [address]    Report Status 09/11/2017 FINAL  Final  Acid Fast Smear (AFB)     Status: None   Collection Time: 09/06/17  3:27 PM  Result Value Ref Range Status   AFB Specimen Processing Concentration  Final   Acid Fast Smear Negative  Final    Comment: (NOTE) Performed At: [facility] [place], [place] [id number] Rush Farmer MD [id number] Performed at [facility], [place] [address] [place], [place] [address]    Source (AFB) ABSCESS  Final  Culture, blood (routine x 2)     Status: None (Preliminary result)   Collection Time: 09/09/17  3:30 PM  Result Value Ref Range Status   Specimen Description BLOOD RIGHT HAND  Final   Special Requests   Final     BOTTLES DRAWN AEROBIC ONLY Blood Culture adequate volume   Culture   Final    NO GROWTH 2 DAYS Performed at [facility], [place] [address] [place], [place] [address]    Report Status PENDING  Incomplete  Culture, blood (routine x 2)     Status: None (Preliminary result)   Collection Time: 09/09/17  3:35 PM  Result Value Ref Range Status   Specimen Description BLOOD LEFT ANTECUBITAL  Final   Special Requests   Final    BOTTLES DRAWN AEROBIC ONLY Blood Culture adequate volume   Culture   Final    NO GROWTH 2 DAYS Performed at [facility], [place] [address] [place], [place] [address]    Report Status PENDING  Incomplete    Studies/Results: No results found.   Assessment/Plan: MS  paraspinal abscess.  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 L rib fractures Small empyema constipation  Total days of antibiotics:6vanco/ceftriaxone  She'll need 6 weeks of vanco and ceftriaxone with Cx (-) D/i primary, home health, pt.  glad to see her in ID clinic in f/u in 4 weeks.    Allergies  Allergen Reactions  . Levaquin [Levofloxacin In D5w] Shortness Of Breath  . Bactrim [Sulfamethoxazole-Trimethoprim] Other (See Comments)    "really red all over, hot skinned"  . Doxycycline Hives  . Other     Seasonal allergies  . Latex Rash    OPAT Orders Discharge antibiotics: ceftriaxone 2g IVPB qday Per pharmacy protocol vancomycin Aim for Vancomycin trough 15-20 (unless otherwise indicated) Duration: 36 days End Date: October 18, 2017  [facility] Care Per Protocol: please  Labs weekly while on IV antibiotics: _x_ CBC with differential __ BMP _x_ CMP _x_ CRP _x_ ESR _x_ Vancomycin trough  __x Please pull PIC at completion of IV antibiotics __ Please leave PIC in place until doctor has seen patient or been notified  Fax weekly labs to [phone]  Clinic Follow Up Appt: Pardeep Pautz 4 weeks.          [name] MD, FACP Infectious Diseases (pager) [phone] [email or web address] 09/12/2017, 2:30 PM  LOS: 7 days

## 2017-09-12 NOTE — Progress Notes (Signed)
PHARMACY CONSULT NOTE FOR:  OUTPATIENT  PARENTERAL ANTIBIOTIC THERAPY (OPAT)  Indication: spinal osteomyelitis / abscess Regimen: vanc 1500mg  IV Q12H + Rocephin 2gm IV Q24H End date: 10/18/17  IV antibiotic discharge orders are pended. To discharging provider:  please sign these orders via discharge navigator,  Select New Orders & click on the button choice - Manage This Unsigned Work.     Thank you for allowing pharmacy to be a part of this patient's care.   Endre Coutts D. Laney [place], PharmD, BCPS, BCCCP Pager:  [phone] 09/12/2017, 2:39 PM

## 2017-09-14 LAB — CULTURE, BLOOD (ROUTINE X 2)
CULTURE: NO GROWTH
CULTURE: NO GROWTH
SPECIAL REQUESTS: ADEQUATE
SPECIAL REQUESTS: ADEQUATE

## 2017-09-16 ENCOUNTER — Encounter: Payer: Self-pay | Admitting: Infectious Diseases

## 2017-09-19 ENCOUNTER — Encounter: Payer: Self-pay | Admitting: Infectious Diseases

## 2017-09-20 ENCOUNTER — Other Ambulatory Visit: Payer: Self-pay | Admitting: Pharmacist

## 2017-09-20 NOTE — Progress Notes (Signed)
OPAT labs

## 2017-09-26 ENCOUNTER — Telehealth: Payer: Self-pay | Admitting: *Deleted

## 2017-09-26 NOTE — Telephone Encounter (Signed)
PCP's office is requesting copies of lab work. RN advised I would contact home health agency and ask them to please send all previous/future lab results to their office. RN spoke with Efraim Kaufmann at [facility].   Patient's home health nurse called. She is unable to collect a vancomycin trough today, asked for verbal order to draw it tomorrow. OK per Cassie. Andree Coss, RN

## 2017-09-27 ENCOUNTER — Other Ambulatory Visit: Payer: Self-pay | Admitting: Pharmacist

## 2017-09-27 NOTE — Progress Notes (Signed)
OPAT pharmacy lab review  

## 2017-09-30 ENCOUNTER — Encounter: Payer: Self-pay | Admitting: Infectious Diseases

## 2017-10-02 ENCOUNTER — Telehealth: Payer: Self-pay

## 2017-10-02 ENCOUNTER — Telehealth: Payer: Self-pay | Admitting: Infectious Diseases

## 2017-10-02 NOTE — Telephone Encounter (Signed)
Addendum to Luis's previos note: Patient called back and states she also wanted Dr. Ninetta Lights to know she is still having low grade fevers ranging from 99.0 to 99.7 and she is concerned about c-reactive protein being elevated. Angeline Slim RN

## 2017-10-02 NOTE — Telephone Encounter (Signed)
Called pt She is having temp 99-99.8 Worried about elevated crp that she saw on labs vanco tr per pt was 17 Had some  prob with pic- getting resistance with infusion. Asked her to have rn call me for cathflow

## 2017-10-02 NOTE — Telephone Encounter (Signed)
Pt left a vm requesting a call back from Dr. Ninetta Lights pt stated he had questions about labs. Would like a call back when available. Attempted to call the pt, however, I was unable to reach the pt and unable to leave a vm. Pt's vm is full and unavailable to take any messages at the time.   Will route message to Dr. Ninetta Lights that pt would like to go over labs with him Lorenso Courier, CMA

## 2017-10-04 ENCOUNTER — Other Ambulatory Visit: Payer: Self-pay | Admitting: Pharmacist

## 2017-10-04 NOTE — Progress Notes (Signed)
error 

## 2017-10-04 NOTE — Progress Notes (Signed)
OPAT pharmacy lab review  

## 2017-10-07 ENCOUNTER — Encounter: Payer: Self-pay | Admitting: Infectious Diseases

## 2017-10-08 LAB — FUNGAL ORGANISM REFLEX

## 2017-10-09 ENCOUNTER — Ambulatory Visit (INDEPENDENT_AMBULATORY_CARE_PROVIDER_SITE_OTHER): Payer: Medicare Other | Admitting: Infectious Diseases

## 2017-10-09 DIAGNOSIS — M4620 Osteomyelitis of vertebra, site unspecified: Secondary | ICD-10-CM

## 2017-10-09 DIAGNOSIS — M5136 Other intervertebral disc degeneration, lumbar region: Secondary | ICD-10-CM | POA: Diagnosis not present

## 2017-10-09 DIAGNOSIS — G35 Multiple sclerosis: Secondary | ICD-10-CM | POA: Diagnosis not present

## 2017-10-09 LAB — CULTURE, FUNGUS WITHOUT SMEAR

## 2017-10-09 NOTE — Progress Notes (Signed)
   Subjective:    Patient ID: [name], female    DOB: November 10, 1970, 47 y.o.   MRN: [id number]  HPI 47 y.o. female with  chronic back pain, DDD, fibromyalgia, anxiety, GERD, IBS (diarrhea predom) and multiple sclerosis (on ocrelizumab for last 18 months) . She has been having low back pain and muscle tightness since April 9th after fall. She was admitted to [facility] for this on 4/17 - 4/19 after MRI finding of subcutaneous edema within the paraspinal soft tissues T9-L2 with peripheral enhancing focal fluid collection measuring at least 3.6 x 0.7 x 10.4 cm. IR was consulted at the time and did not feel that the collection required drainage. Last dose of antibiotics were received in the hospital prior to her leaving AMA due to poor pain control and severe constipation.   She returned to ED due to continued pain and underwent repeat MRI thoracic/lumbar spine 09-05-17- Left 9th rib osteomyelitis and potential pathologic fracture associated with 11 x 14 mm abscess, small left pleural effusion and trace suspected empyema. Left paraspinal myositis with Left superficial paraspinal abscess measuring 1.1 x 4.5 x 7.9 cm w/o discitis or osteomyelitis or epidural abscess.  She had BCx that was (-) growth, she had IR aspirate on 4-26 that showed GPC but was no growth. She was d/c to home on 5-2 on vanco/ceftriaxone. Her end date is 10-18-17.   C/o feeling horrible. Has had low grade fevers (99.9 this AM, 101 last 2 nights). Has been taking tylenol, prn.  Has continued back pain and headaches.  At her prev blood draw her Glc was in the 20s, this was repeated and was 133.  PIC is still sluggish. No erythema or d/c until today (erythema).  Has trouble keeping bandages on, sweats. Wants hypofix tape.    5-23 ESR 23 CRP 13  We reviewed her allergies- states that one of her drugs gave her LE blisters. She is not sure which (doxy or latex)  Review of Systems  Constitutional: Positive for fatigue and  fever.  Gastrointestinal: Positive for diarrhea. Negative for constipation.  Genitourinary: Negative for difficulty urinating.  Neurological: Positive for light-headedness.  called and told her vanco level was a "little high" she is skip tonight's dose and get new dose in AM.  Please see HPI. All other systems reviewed and negative.      Objective:   Physical Exam  Constitutional: She appears well-developed and well-nourished.  HENT:  Mouth/Throat: No oropharyngeal exudate.  Eyes: Pupils are equal, round, and reactive to light. EOM are normal.  Neck: Normal range of motion. Neck supple.  Cardiovascular: Normal rate, regular rhythm and normal heart sounds.  Pulmonary/Chest: Effort normal and breath sounds normal.  Abdominal: Soft. Bowel sounds are normal. There is no tenderness. There is no guarding.  Musculoskeletal: She exhibits no edema.       Arms: Skin: Skin is warm and dry.       Assessment & Plan:

## 2017-10-09 NOTE — Assessment & Plan Note (Signed)
She feels like she is better.  She does not want to stop her anbx a week early. States she will live on tylenol.  I have some concerns about her temps, pic line and possible ADR.  Will see her back in 2 weeks, consider repeat MRI then.

## 2017-10-09 NOTE — Assessment & Plan Note (Signed)
She feels that she is not controlled right now.  Suggested that her elevated temps could be from [facility], ADR, uncontrolled MS.  She is also worried that her temps also could be from environment.  Asked her to not take more than 2g tylenol/day.

## 2017-10-10 ENCOUNTER — Encounter: Payer: Self-pay | Admitting: Infectious Diseases

## 2017-10-11 ENCOUNTER — Other Ambulatory Visit: Payer: Self-pay | Admitting: Pharmacist

## 2017-10-11 NOTE — Progress Notes (Signed)
OPAT pharmacy lab review  

## 2017-10-14 ENCOUNTER — Encounter: Payer: Self-pay | Admitting: Infectious Diseases

## 2017-10-18 ENCOUNTER — Other Ambulatory Visit: Payer: Self-pay | Admitting: Pharmacist

## 2017-10-18 NOTE — Progress Notes (Signed)
OPAT pharmacy lab review  

## 2017-10-31 HISTORY — PX: LEG SURGERY: SHX1003

## 2017-11-04 ENCOUNTER — Ambulatory Visit: Payer: Medicare Other | Admitting: Infectious Diseases

## 2017-11-07 LAB — ACID FAST SMEAR (AFB, MYCOBACTERIA): Acid Fast Smear: NEGATIVE

## 2017-11-07 LAB — ACID FAST SMEAR (AFB)

## 2017-11-11 ENCOUNTER — Other Ambulatory Visit: Payer: Self-pay | Admitting: Infectious Diseases

## 2017-11-11 DIAGNOSIS — M4620 Osteomyelitis of vertebra, site unspecified: Secondary | ICD-10-CM

## 2017-11-21 ENCOUNTER — Telehealth: Payer: Self-pay

## 2017-11-21 NOTE — Telephone Encounter (Signed)
No need to repeat MRI thanks

## 2017-11-21 NOTE — Telephone Encounter (Signed)
Pt called today to inform MD that she had MRI of Lumbar and Thoracic area on 11/07/17. PT stated she was not sure if MD would like to continue MRI  Ordered by our office and if so would it be possible to get this done at [facility]. PT will call [facility] to see if she can have MRI results faxed to our office. PT would like a call back on wether she will still be getting the MRI Md ordered or if the MRI from [facility] would be okay. Will route message to MD to advised if pt should still get the MRI done. Lorenso Courier, [place]

## 2017-11-25 ENCOUNTER — Encounter: Payer: Self-pay | Admitting: Infectious Diseases

## 2017-12-03 ENCOUNTER — Inpatient Hospital Stay: Admission: RE | Admit: 2017-12-03 | Payer: Medicare Other | Source: Ambulatory Visit

## 2017-12-10 ENCOUNTER — Ambulatory Visit
Admission: RE | Admit: 2017-12-10 | Discharge: 2017-12-10 | Disposition: A | Discharge status: Home / Self Care | Payer: Medicare Other | Source: Ambulatory Visit | Attending: Infectious Diseases | Admitting: Infectious Diseases

## 2017-12-10 DIAGNOSIS — M4620 Osteomyelitis of vertebra, site unspecified: Secondary | ICD-10-CM

## 2017-12-10 MED ORDER — GADOBENATE DIMEGLUMINE 529 MG/ML IV SOLN
15.0000 mL | Freq: Once | INTRAVENOUS | Status: AC | PRN
  Administered 2017-12-10: 15 mL via INTRAVENOUS

## 2017-12-12 ENCOUNTER — Ambulatory Visit (INDEPENDENT_AMBULATORY_CARE_PROVIDER_SITE_OTHER): Payer: Medicare Other | Admitting: Infectious Diseases

## 2017-12-12 DIAGNOSIS — M4620 Osteomyelitis of vertebra, site unspecified: Secondary | ICD-10-CM

## 2017-12-12 NOTE — Assessment & Plan Note (Signed)
She is doing very well.  From an infection perspective, her MRI looks good and she can resume her MS therapy.  She can come back to ID clinic prn.

## 2017-12-12 NOTE — Progress Notes (Signed)
   Subjective:    Patient ID: [name], female    DOB: 1970-09-23, 47 y.o.   MRN: [id number]  HPI 47 y.o.femalewith  chronic back pain, DDD, fibromyalgia, anxiety, GERD, IBS (diarrhea predom) and multiple sclerosis (onocrelizumab for last 18 months) . She has been having low back pain and muscle tightness since April 9th after fall. She was admitted to [facility] for this on 4/17 - 4/19 afterMRI findingofsubcutaneous edema within the paraspinal soft tissuesT9-L2with peripheral enhancing focal fluid collection measuring at least 3.6 x 0.7 x 10.4 cm. IR was consulted at the time and did not feel that the collection required drainage. Last dose of antibiotics were received in the hospital prior to her leaving AMA due to poor pain control and severe constipation.   She returned to ED due to continued pain and underwent repeat MRI thoracic/lumbar spine 09-05-17- Left 9th rib osteomyelitis and potential pathologic fracture associated with 11 x 14 mm abscess, small left pleural effusion and trace suspected empyema. Left paraspinal myositis with Left superficial paraspinal abscess measuring 1.1 x 4.5 x 7.9 cm w/o discitis or osteomyelitis or epidural abscess.  She had BCx that was (-) growth, she had IR aspirate on 4-26 that showed GPC but was no growth. She was d/c to home on 5-2 on vanco/ceftriaxone. Her end date is 10-18-17.   5-23 ESR 23 CRP 13  She was seen in ID for f/u on 5-29. She had concerns about continued fever, she continued her anbx for a week to complete the original course. She had f/u MRI 7-30 which showed:  1. Interval resolution of left ninth rib osteomyelitis with residual healed left posterior ninth rib fracture. Previously seen soft tissue abscess and probable left empyema within this region have resolved. 2. No new evidence for infection within the thoracic spine. 3. Mild edema about the T4-5 facets, favored to be degenerative/inflammatory in nature, similar to  previous.  She continues to not feel well- fatigue, hot and cold. Diarrhea (3-5x/day). Having episodes of sweats every 15 minutes then underblankets. Taking tylenol "religiously" every 4-6 h/day.  Still has soreness in her ribs.  Is to have surgery tomorrow to repair patellar tendon tear in L knee after fall, prior chan saw injury.   Review of Systems  Constitutional: Positive for fatigue. Negative for chills and fever.  Gastrointestinal: Positive for diarrhea. Negative for constipation.  Genitourinary: Negative for difficulty urinating.  Please see HPI. All other systems reviewed and negative.      Objective:   Physical Exam  Constitutional: She is oriented to person, place, and time. She appears well-developed and well-nourished.  HENT:  Mouth/Throat: No oropharyngeal exudate.  Eyes: Pupils are equal, round, and reactive to light. EOM are normal.  Neck: Normal range of motion. Neck supple.  Cardiovascular: Normal rate, regular rhythm and normal heart sounds.  Pulmonary/Chest: Effort normal and breath sounds normal. She exhibits no tenderness.  Abdominal: Soft. Bowel sounds are normal. She exhibits no distension.  Musculoskeletal: She exhibits no edema.       Legs: Neurological: She is alert and oriented to person, place, and time.  Skin: Skin is warm and dry.      Assessment & Plan:

## 2017-12-13 HISTORY — PX: KNEE SURGERY: SHX244

## 2017-12-18 ENCOUNTER — Telehealth: Payer: Self-pay | Admitting: *Deleted

## 2017-12-18 NOTE — Telephone Encounter (Signed)
Called patient to reschedule her follow up on Mon, provider will be in meeting. Spoke with caregiver, [name] on [place]. Rescheduled her for next Thurs, advised they arrive 30 min early to check in.   He verbalized understanding.

## 2017-12-23 ENCOUNTER — Ambulatory Visit: Payer: Medicare Other | Admitting: Diagnostic Neuroimaging

## 2017-12-26 ENCOUNTER — Encounter: Payer: Self-pay | Admitting: Diagnostic Neuroimaging

## 2017-12-26 ENCOUNTER — Telehealth: Payer: Self-pay | Admitting: Diagnostic Neuroimaging

## 2017-12-26 ENCOUNTER — Ambulatory Visit (INDEPENDENT_AMBULATORY_CARE_PROVIDER_SITE_OTHER): Payer: Medicare Other | Admitting: Diagnostic Neuroimaging

## 2017-12-26 VITALS — BP 121/78 | HR 85 | Ht 67.0 in | Wt 168.4 lb

## 2017-12-26 DIAGNOSIS — S86812S Strain of other muscle(s) and tendon(s) at lower leg level, left leg, sequela: Secondary | ICD-10-CM

## 2017-12-26 DIAGNOSIS — G35 Multiple sclerosis: Secondary | ICD-10-CM | POA: Diagnosis not present

## 2017-12-26 DIAGNOSIS — M4620 Osteomyelitis of vertebra, site unspecified: Secondary | ICD-10-CM

## 2017-12-26 DIAGNOSIS — R32 Unspecified urinary incontinence: Secondary | ICD-10-CM | POA: Diagnosis not present

## 2017-12-26 NOTE — Progress Notes (Signed)
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 Mechele Dawley - continue levetiracetam and lyrica for muscle spasms and nerve pain  DEPRESSION / ANXIETY - stable overall; mood disorder / depression / anxiety--> managed by Nena Polio, NP (PCP) and Dr. Ruthann Cancer (psychiatry)  URINARY INCONTINENCE - continue oxybutynin for overactive bladder  Orders Placed This Encounter  Procedures  . MR BRAIN W WO CONTRAST  . CBC with Differential/Platelet  . Comprehensive metabolic panel   Return in about 6 months (around 06/28/2018).    Penni Bombard, MD 09/28/6158, 73:71 AM Certified in Neurology, Neurophysiology and Neuroimaging  [facility] [address] [place],  [address] [phone]

## 2017-12-26 NOTE — Telephone Encounter (Signed)
Medicare/[employer] auth: [employer] via [facility] website order sent to [facility]. They will reach out to the pt to schedule.  °

## 2017-12-27 LAB — COMPREHENSIVE METABOLIC PANEL
ALBUMIN: 4.4 g/dL (ref 3.5–5.5)
ALT: 15 IU/L (ref 0–32)
AST: 21 IU/L (ref 0–40)
Albumin/Globulin Ratio: 1.6 (ref 1.2–2.2)
Alkaline Phosphatase: 73 IU/L (ref 39–117)
BUN / CREAT RATIO: 27 — AB (ref 9–23)
BUN: 26 mg/dL — ABNORMAL HIGH (ref 6–24)
Bilirubin Total: <0.2 mg/dL (ref 0.0–1.2)
CALCIUM: 9.9 mg/dL (ref 8.7–10.2)
CO2: 25 mmol/L (ref 20–29)
Chloride: 105 mmol/L (ref 96–106)
Creatinine, Ser: 0.98 mg/dL (ref 0.57–1.00)
GFR, EST AFRICAN AMERICAN: 79 mL/min/{1.73_m2} (ref >59)
GFR, EST NON AFRICAN AMERICAN: 68 mL/min/{1.73_m2} (ref >59)
GLOBULIN, TOTAL: 2.8 g/dL (ref 1.5–4.5)
Glucose: 86 mg/dL (ref 65–99)
Potassium: 4.8 mmol/L (ref 3.5–5.2)
SODIUM: 141 mmol/L (ref 134–144)
TOTAL PROTEIN: 7.2 g/dL (ref 6.0–8.5)

## 2017-12-27 LAB — CBC WITH DIFFERENTIAL/PLATELET
BASOS: 1 %
Basophils Absolute: 0.1 10*3/uL (ref 0.0–0.2)
EOS (ABSOLUTE): 0.3 10*3/uL (ref 0.0–0.4)
EOS: 5 %
HEMATOCRIT: 42 % (ref 34.0–46.6)
HEMOGLOBIN: 14.3 g/dL (ref 11.1–15.9)
IMMATURE GRANS (ABS): 0 10*3/uL (ref 0.0–0.1)
IMMATURE GRANULOCYTES: 0 %
Lymphocytes Absolute: 1.8 10*3/uL (ref 0.7–3.1)
Lymphs: 30 %
MCH: 33 pg (ref 26.6–33.0)
MCHC: 34 g/dL (ref 31.5–35.7)
MCV: 97 fL (ref 79–97)
MONOCYTES: 15 %
Monocytes Absolute: 0.9 10*3/uL (ref 0.1–0.9)
NEUTROS PCT: 49 %
Neutrophils Absolute: 3 10*3/uL (ref 1.4–7.0)
Platelets: 214 10*3/uL (ref 150–450)
RBC: 4.33 x10E6/uL (ref 3.77–5.28)
RDW: 13.9 % (ref 12.3–15.4)
WBC: 6 10*3/uL (ref 3.4–10.8)

## 2017-12-30 ENCOUNTER — Telehealth: Payer: Self-pay | Admitting: *Deleted

## 2017-12-30 NOTE — Telephone Encounter (Signed)
LVM informing patient her lab results are okay. Advised her per Dr Marjory Lies, this RN advised [name] Fermo RN to restart her Ocrevus infusions.  Reminded her of her MRI appt this Sunday. Left number for any questions.

## 2018-01-03 LAB — ACID FAST CULTURE WITH REFLEXED SENSITIVITIES (MYCOBACTERIA)

## 2018-01-03 LAB — ACID FAST CULTURE WITH REFLEXED SENSITIVITIES: ACID FAST CULTURE - AFSCU3: NEGATIVE

## 2018-01-05 ENCOUNTER — Other Ambulatory Visit: Payer: Medicare Other

## 2018-01-25 ENCOUNTER — Ambulatory Visit
Admission: RE | Admit: 2018-01-25 | Discharge: 2018-01-25 | Disposition: A | Discharge status: Home / Self Care | Payer: Medicare Other | Source: Ambulatory Visit | Attending: Diagnostic Neuroimaging | Admitting: Diagnostic Neuroimaging

## 2018-01-25 DIAGNOSIS — G35 Multiple sclerosis: Secondary | ICD-10-CM

## 2018-01-25 MED ORDER — GADOBENATE DIMEGLUMINE 529 MG/ML IV SOLN
15.0000 mL | Freq: Once | INTRAVENOUS | Status: AC | PRN
  Administered 2018-01-25: 16 mL via INTRAVENOUS

## 2018-01-28 ENCOUNTER — Telehealth: Payer: Self-pay | Admitting: *Deleted

## 2018-01-28 NOTE — Telephone Encounter (Signed)
LMVM for pt on her home # that the MRI results were stable.  (stable imaging results).  She is to call back if questions.

## 2018-01-28 NOTE — Telephone Encounter (Signed)
-----   Message from Suanne Marker, MD sent at 01/27/2018  8:41 AM EDT ----- Stable imaging results. Please call patient. Continue current plan. -VRP

## 2018-04-13 DIAGNOSIS — S32009A Unspecified fracture of unspecified lumbar vertebra, initial encounter for closed fracture: Secondary | ICD-10-CM

## 2018-04-13 HISTORY — DX: Unspecified fracture of unspecified lumbar vertebra, initial encounter for closed fracture: S32.009A

## 2018-06-26 HISTORY — PX: KNEE SURGERY: SHX244

## 2018-07-01 ENCOUNTER — Encounter: Payer: Self-pay | Admitting: Diagnostic Neuroimaging

## 2018-07-01 ENCOUNTER — Ambulatory Visit (INDEPENDENT_AMBULATORY_CARE_PROVIDER_SITE_OTHER): Payer: Medicare Other | Admitting: Diagnostic Neuroimaging

## 2018-07-01 VITALS — BP 116/67 | HR 84 | Ht 67.0 in | Wt 186.0 lb

## 2018-07-01 DIAGNOSIS — G35 Multiple sclerosis: Secondary | ICD-10-CM

## 2018-07-01 DIAGNOSIS — R269 Unspecified abnormalities of gait and mobility: Secondary | ICD-10-CM | POA: Diagnosis not present

## 2018-07-01 DIAGNOSIS — M79605 Pain in left leg: Secondary | ICD-10-CM | POA: Diagnosis not present

## 2018-07-01 DIAGNOSIS — M79604 Pain in right leg: Secondary | ICD-10-CM
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
## 2018-07-02 LAB — COMPREHENSIVE METABOLIC PANEL
ALT: 15 IU/L (ref 0–32)
AST: 27 IU/L (ref 0–40)
Albumin/Globulin Ratio: 2 (ref 1.2–2.2)
Albumin: 4.5 g/dL (ref 3.8–4.8)
Alkaline Phosphatase: 86 IU/L (ref 39–117)
BUN/Creatinine Ratio: 18 (ref 9–23)
BUN: 22 mg/dL (ref 6–24)
Bilirubin Total: 0.2 mg/dL (ref 0.0–1.2)
CO2: 25 mmol/L (ref 20–29)
Calcium: 9.1 mg/dL (ref 8.7–10.2)
Chloride: 103 mmol/L (ref 96–106)
Creatinine, Ser: 1.24 mg/dL — ABNORMAL HIGH (ref 0.57–1.00)
GFR calc Af Amer: 59 mL/min/{1.73_m2} — ABNORMAL LOW (ref >59)
GFR calc non Af Amer: 51 mL/min/{1.73_m2} — ABNORMAL LOW (ref >59)
Globulin, Total: 2.2 g/dL (ref 1.5–4.5)
Glucose: 62 mg/dL — ABNORMAL LOW (ref 65–99)
POTASSIUM: 5 mmol/L (ref 3.5–5.2)
Sodium: 141 mmol/L (ref 134–144)
Total Protein: 6.7 g/dL (ref 6.0–8.5)

## 2018-07-02 LAB — CBC WITH DIFFERENTIAL/PLATELET
Basophils Absolute: 0.1 10*3/uL (ref 0.0–0.2)
Basos: 1 %
EOS (ABSOLUTE): 0.4 10*3/uL (ref 0.0–0.4)
Eos: 4 %
Hematocrit: 35.7 % (ref 34.0–46.6)
Hemoglobin: 12.6 g/dL (ref 11.1–15.9)
Immature Grans (Abs): 0.1 10*3/uL (ref 0.0–0.1)
Immature Granulocytes: 1 %
Lymphocytes Absolute: 1.6 10*3/uL (ref 0.7–3.1)
Lymphs: 16 %
MCH: 34.1 pg — ABNORMAL HIGH (ref 26.6–33.0)
MCHC: 35.3 g/dL (ref 31.5–35.7)
MCV: 97 fL (ref 79–97)
Monocytes Absolute: 0.9 10*3/uL (ref 0.1–0.9)
Monocytes: 9 %
Neutrophils Absolute: 6.7 10*3/uL (ref 1.4–7.0)
Neutrophils: 69 %
Platelets: 191 10*3/uL (ref 150–450)
RBC: 3.69 x10E6/uL — ABNORMAL LOW (ref 3.77–5.28)
RDW: 11.8 % (ref 11.7–15.4)
WBC: 9.8 10*3/uL (ref 3.4–10.8)

## 2018-07-13 ENCOUNTER — Other Ambulatory Visit: Payer: Self-pay | Admitting: Diagnostic Neuroimaging

## 2018-07-25 ENCOUNTER — Telehealth: Payer: Self-pay | Admitting: *Deleted

## 2018-07-25 NOTE — Telephone Encounter (Signed)
Attempted to reach patient with lab results. Phone never rang, message stated "your call cannot be completed at this time." will call back Monday.

## 2018-07-28 NOTE — Telephone Encounter (Signed)
Spoke with patient and informed her labs showed her kidney function or creatinine level is slightly high. Dr Marjory Lies advised she stay hydrated and follow up with her PCP. Advised her other labs are okay. She asked for WBC, RBC counts; I gave her those.  She stated she doesn't have appt with PCP soon. I advised she be sure to stay well hydrated, stay inside due to corona virus. She verbalized understanding, appreciation.

## 2018-12-29 ENCOUNTER — Telehealth: Payer: Self-pay | Admitting: *Deleted

## 2018-12-29 NOTE — Telephone Encounter (Signed)
LVM requesting a call back re: if she agrees to see A Lomax NP this Wed or Thurs instead of Dr Leta Baptist. Left office number.

## 2018-12-31 ENCOUNTER — Ambulatory Visit: Payer: Medicare Other | Admitting: Diagnostic Neuroimaging

## 2018-12-31 NOTE — Telephone Encounter (Signed)
Patient was no show for follow up today. 

## 2019-01-07 ENCOUNTER — Ambulatory Visit: Payer: Self-pay | Admitting: Family Medicine

## 2019-01-07 ENCOUNTER — Telehealth: Payer: Self-pay

## 2019-01-07 NOTE — Patient Instructions (Addendum)
Continue close follow up with specialists  Continue Ocrevus infusions every 6 months  Follow up in 6 months  Multiple Sclerosis Multiple sclerosis (MS) is a disease of the brain, spinal cord, and optic nerves (central nervous system). It causes the body's disease-fighting (immune) system to destroy the protective covering (myelin sheath) around nerves in the brain. When this happens, signals (nerve impulses) going to and from the brain and spinal cord do not get sent properly or may not get sent at all. There are several types of MS:  Relapsing-remitting MS. This is the most common type. This causes sudden attacks of symptoms. After an attack, you may recover completely until the next attack, or some symptoms may remain permanently.  Secondary progressive MS. This usually develops after the onset of relapsing-remitting MS. Similar to relapsing-remitting MS, this type also causes sudden attacks of symptoms. Attacks may be less frequent, but symptoms slowly get worse (progress) over time.  Primary progressive MS. This causes symptoms that steadily progress over time. This type of MS does not cause sudden attacks of symptoms. The age of onset of MS varies, but it often develops between 81-40 years of age. MS is a lifelong (chronic) condition. There is no cure, but treatment can help slow down the progression of the disease. What are the causes? The cause of this condition is not known. What increases the risk? You are more likely to develop this condition if:  You are a woman.  You have a relative with MS. However, the condition is not passed from parent to child (inherited).  You have a lack (deficiency) of vitamin D.  You smoke. MS is more common in the [place] than in the [place]. What are the signs or symptoms? Relapsing-remitting and secondary progressive MS cause symptoms to occur in episodes or attacks that may last weeks to months. There may be long  periods between attacks in which there are almost no symptoms. Primary progressive MS causes symptoms to steadily progress after they develop. Symptoms of MS vary because of the many different ways it affects the central nervous system. The main symptoms include:  Vision problems and eye pain.  Numbness.  Weakness.  Inability to move your arms, hands, feet, or legs (paralysis).  Balance problems.  Shaking that you cannot control (tremors).  Muscle spasms.  Problems with thinking (cognitive changes). MS can also cause symptoms that are associated with the disease, but are not always the direct result of an MS attack. They may include:  Inability to control urination or bowel movements (incontinence).  Headaches.  Fatigue.  Inability to tolerate heat.  Emotional changes.  Depression.  Pain. How is this diagnosed? This condition is diagnosed based on:  Your symptoms.  A neurological exam. This involves checking central nervous system function, such as nerve function, reflexes, and coordination.  MRIs of the brain and spinal cord.  Lab tests, including a lumbar puncture that tests the fluid that surrounds the brain and spinal cord (cerebrospinal fluid).  Tests to measure the electrical activity of the brain in response to stimulation (evoked potentials). How is this treated? There is no cure for MS, but medicines can help decrease the number and frequency of attacks and help relieve nuisance symptoms. Treatment options may include:  Medicines that reduce the frequency of attacks. These medicines may be given by injection, by mouth (orally), or through an IV.  Medicines that reduce inflammation (steroids). These may provide short-term relief of symptoms.  Medicines to  help control pain, depression, fatigue, or incontinence.  Vitamin D, if you have a deficiency.  Using devices to help you move around (assistive devices), such as braces, a cane, or a walker.   Physical therapy to strengthen and stretch your muscles.  Occupational therapy to help you with everyday tasks.  Alternative or complementary treatments such as exercise, massage, or acupuncture. Follow these instructions at home:  Take over-the-counter and prescription medicines only as told by your health care provider.  Do not drive or use heavy machinery while taking prescription pain medicine.  Use assistive devices as recommended by your physical therapist or your health care provider.  Exercise as directed by your health care provider.  Return to your normal activities as told by your health care provider. Ask your health care provider what activities are safe for you.  Reach out for support. Share your feelings with friends, family, or a support group.  Keep all follow-up visits as told by your health care provider and therapists. This is important. Where to find more information  National Multiple Sclerosis Society: https://www.nationalmssociety.org Contact a health care provider if:  You feel depressed.  You develop new pain or numbness.  You have tremors.  You have problems with sexual function. Get help right away if:  You develop paralysis.  You develop numbness.  You have problems with your bladder or bowel function.  You develop double vision.  You lose vision in one or both eyes.  You develop suicidal thoughts.  You develop severe confusion. If you ever feel like you may hurt yourself or others, or have thoughts about taking your own life, get help right away. You can go to your nearest emergency department or call:  Your local emergency services (911 in the [place]  A suicide crisis helpline, such as the National Suicide Prevention Lifeline at (913)296-76311-(346)163-4980. This is open 24 hours a day. Summary  Multiple sclerosis (MS) is a disease of the central nervous system that causes the body's immune system to destroy the protective covering (myelin  sheath) around nerves in the brain.  There are 3 types of MS: relapsing-remitting, secondary progressive, and primary progressive. Relapsing-remitting and secondary progressive MS cause symptoms to occur in episodes or attacks that may last weeks to months. Primary progressive MS causes symptoms to steadily progress after they develop.  There is no cure for MS, but medicines can help decrease the number and frequency of attacks and help relieve nuisance symptoms. Treatment may also include physical or occupational therapy.  If you develop numbness, paralysis, vision problems, or other neurological symptoms, get help right away. This information is not intended to replace advice given to you by your health care provider. Make sure you discuss any questions you have with your health care provider. Document Released: 04/27/2000 Document Revised: 04/12/2017 Document Reviewed: 07/09/2016 Elsevier Patient Education  2020 [employer]
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
## 2019-01-07 NOTE — Telephone Encounter (Signed)
Patient was a no call/no show for their appointment today.   

## 2019-01-08 ENCOUNTER — Other Ambulatory Visit: Payer: Self-pay

## 2019-01-08 ENCOUNTER — Ambulatory Visit (INDEPENDENT_AMBULATORY_CARE_PROVIDER_SITE_OTHER): Payer: Medicare Other | Admitting: Family Medicine

## 2019-01-08 ENCOUNTER — Encounter: Payer: Self-pay | Admitting: Family Medicine

## 2019-01-08 VITALS — BP 128/90 | HR 81 | Temp 97.1°F | Ht 67.0 in | Wt 186.2 lb

## 2019-01-08 DIAGNOSIS — R32 Unspecified urinary incontinence: Secondary | ICD-10-CM

## 2019-01-08 DIAGNOSIS — G35 Multiple sclerosis: Secondary | ICD-10-CM

## 2019-01-08 DIAGNOSIS — F418 Other specified anxiety disorders: Secondary | ICD-10-CM | POA: Diagnosis not present

## 2019-01-08 DIAGNOSIS — G894 Chronic pain syndrome: Secondary | ICD-10-CM | POA: Diagnosis not present

## 2019-01-13 NOTE — Progress Notes (Signed)
I reviewed note and agree with plan.   Penni Bombard, MD 7/0/3403, 5:24 PM Certified in Neurology, Neurophysiology and Neuroimaging  [facility] [address] [place], [place] [address] [phone]

## 2019-01-14 ENCOUNTER — Telehealth: Payer: Self-pay | Admitting: *Deleted

## 2019-01-14 NOTE — Telephone Encounter (Signed)
Ocrevus start form/prescriber service form competed, faxed to [employer] Patient's last infusion was 11/13/18, next one 05/11/19. Copy given to [employer], infusion suite.

## 2019-05-30 IMAGING — CT CT CHEST W/ CM
2 of 3 series · 15 of 36 positions shown, 18 images · IV contrast (Omni 300)
Comparison: MRI thoracic and lumbar spine September 05, 2017

CLINICAL DATA: Complicated pneumonia. Abscess seen on yesterday's
MRI.

EXAM:
CT CHEST WITH CONTRAST
TECHNIQUE: Multidetector CT imaging of the chest was performed during
intravenous contrast administration.
CONTRAST:  100mL [id number] IOPAMIDOL ([id number]) INJECTION 61%

[Series 3: chest with 2mm st · axial · 0.70mm/px · z∈[+985,+1289]mm · 12 of 180 slices shown, 15 images]
[im 14/180  mediastinal]
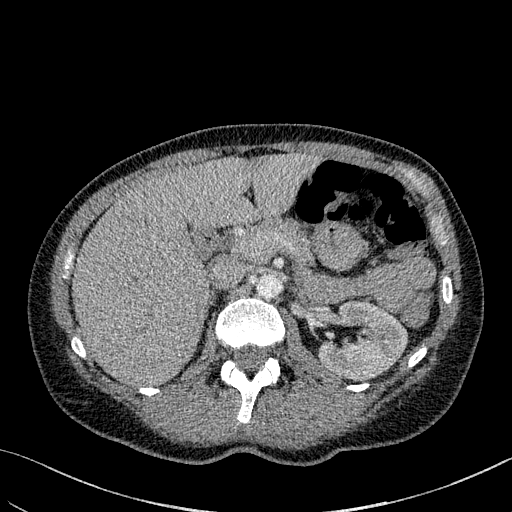
[im 14/180  lung]
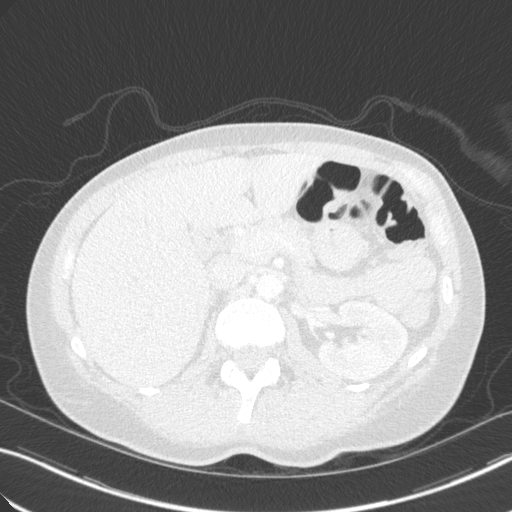
[im 27/180  lung]
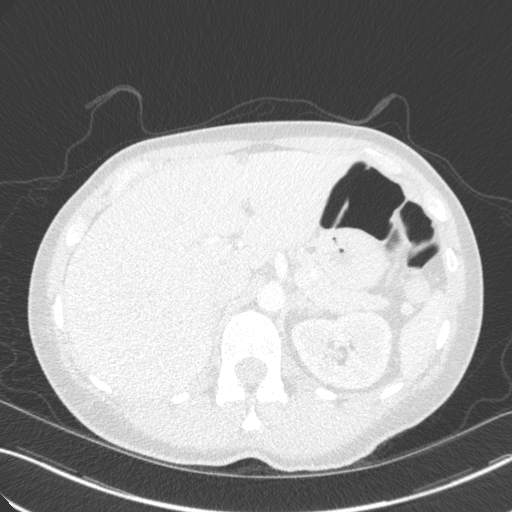
[im 40/180  lung]
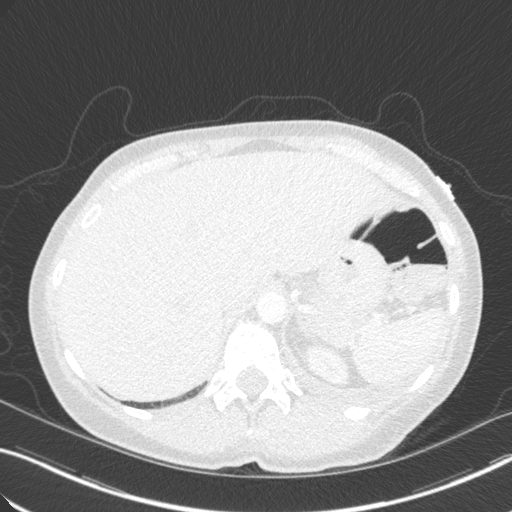
[im 54/180  lung]
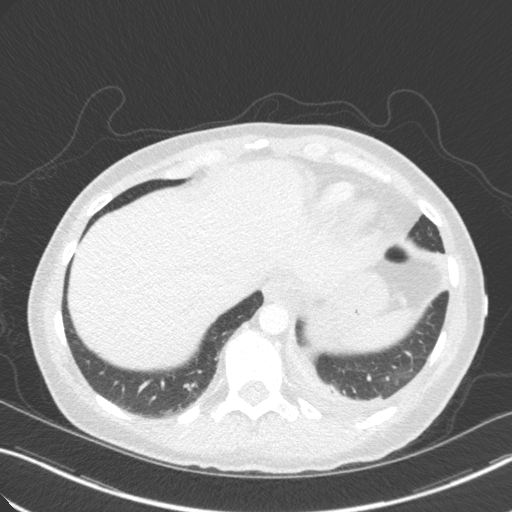
[im 67/180  mediastinal]
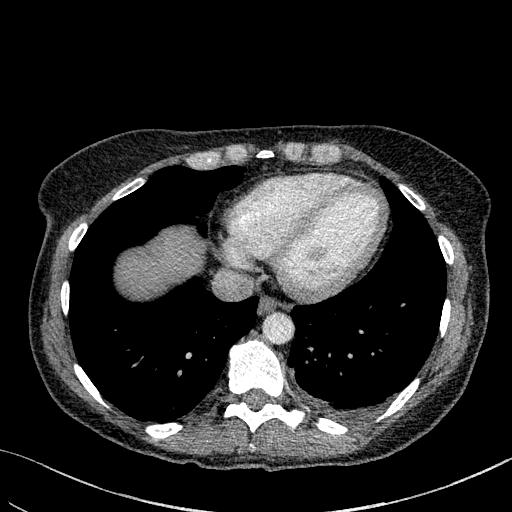
[im 67/180  lung]
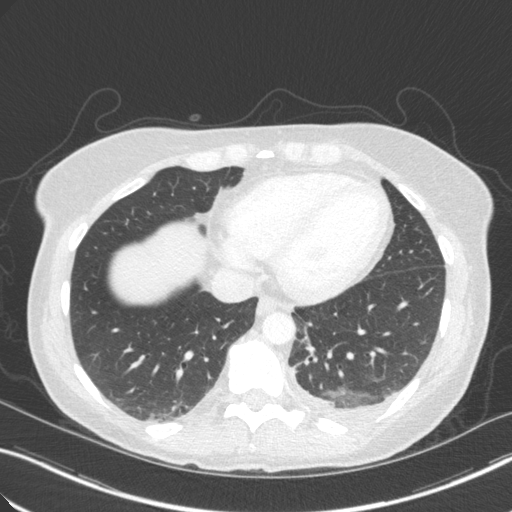
[im 80/180  lung]
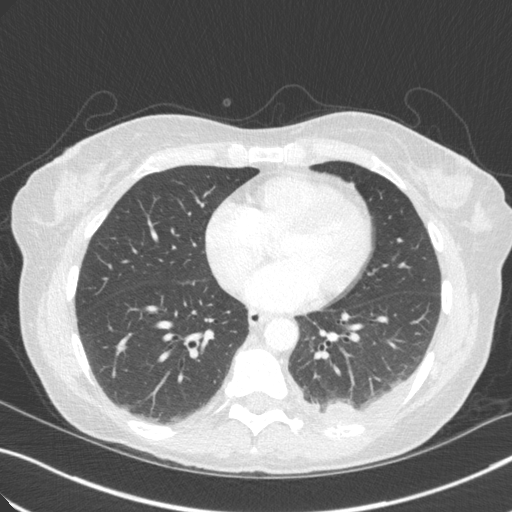
[im 100/180  lung]
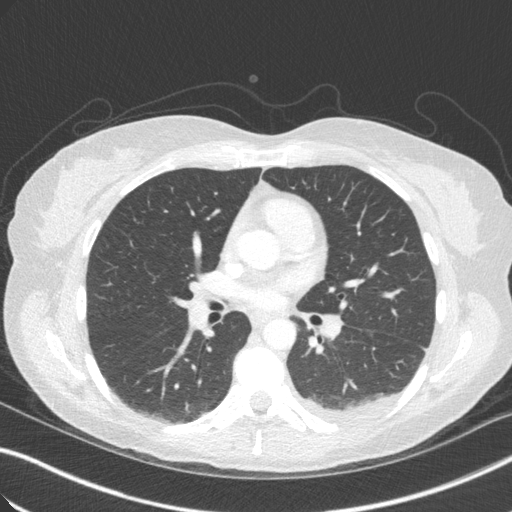
[im 113/180  lung]
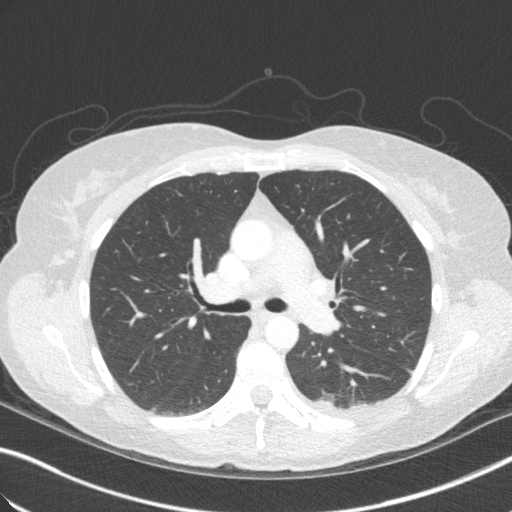
[im 126/180  mediastinal]
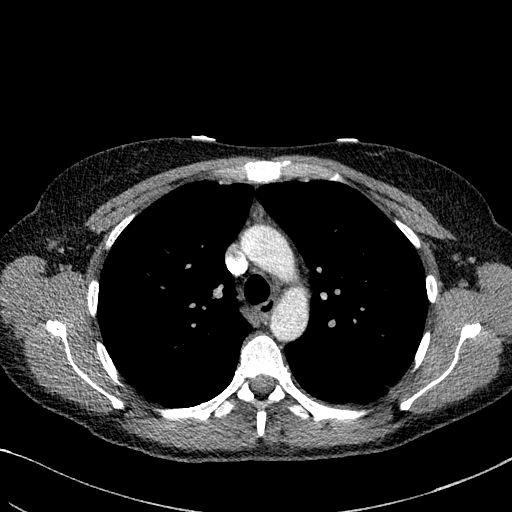
[im 126/180  lung]
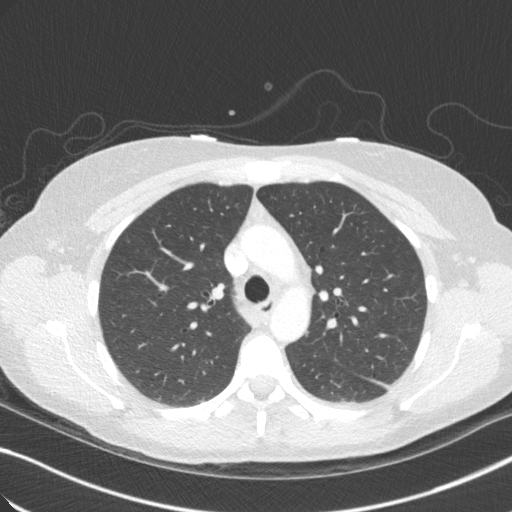
[im 140/180  lung]
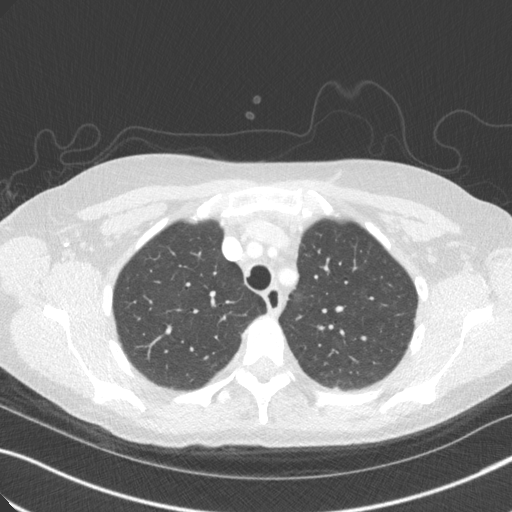
[im 153/180  lung]
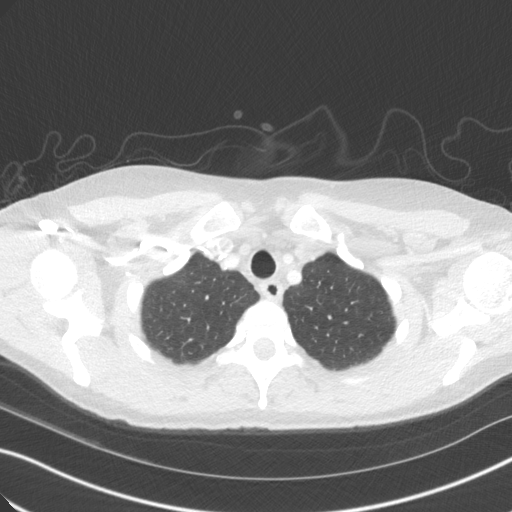
[im 166/180  lung]
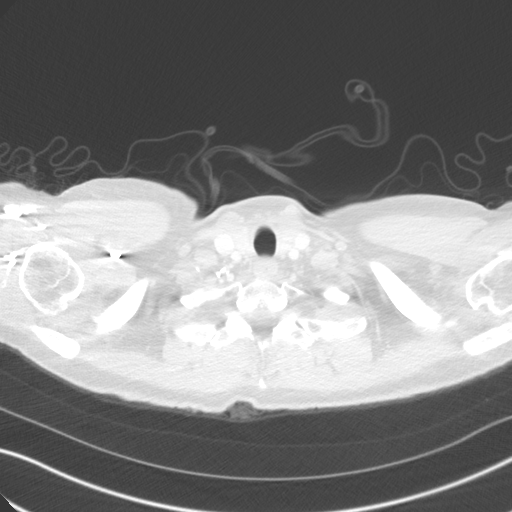

[Series 5: chest with 3mm st cor · coronal · 0.70mm/px · 3 of 101 slices shown]
[im 21/101  lung]
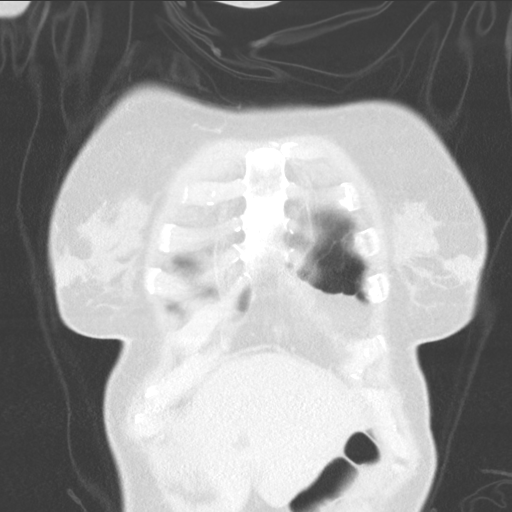
[im 41/101  lung]
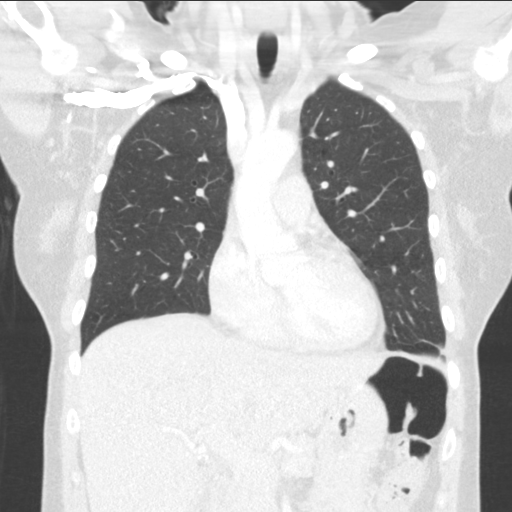
[im 61/101  lung]
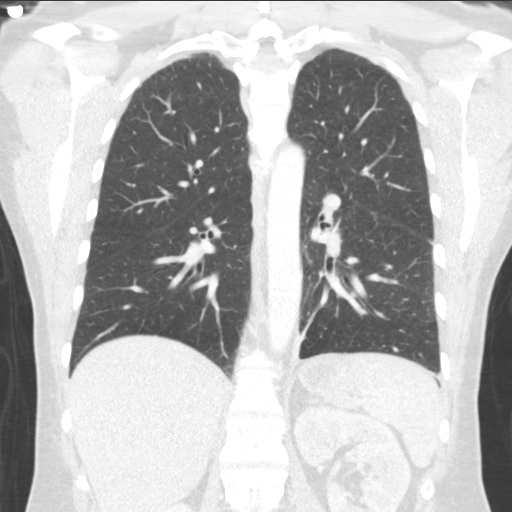

[15 of 36 positions shown; findings below may reference images not displayed]

FINDINGS: CARDIOVASCULAR: Heart and pericardium are unremarkable. Thoracic
aorta is normal course and caliber, unremarkable.

MEDIASTINUM/NODES: No mediastinal mass. No lymphadenopathy by CT
size criteria. Normal appearance of thoracic esophagus though not
tailored for evaluation.

LUNGS/PLEURA: Tracheobronchial tree is patent, no pneumothorax.
Small LEFT pleural effusion with subpulmonic component. Lobulated
pleural collection measuring to 2.1 cm corresponding to known
abscess.

UPPER ABDOMEN: Nonacute. 3 mm LEFT interpolar nephrolithiasis. 13 mm
cyst LEFT lobe of the liver.

MUSCULOSKELETAL: LEFT posterior nondisplaced T8 and T9 rib fractures
with associated lytic component T9 (series 5, image 84). Mild
asymmetrically prominent LEFT paraspinal muscles without discrete
fluid collection by CT though, demonstrated on yesterday's MRI.
IMPRESSION: 1. Nondisplaced LEFT T8 and T9 fractures with lytic component
consistent with pathologic fracture and osteomyelitis. Associated
2.1 cm focal pleural versus parenchymal abscess and small empyema
better demonstrated on yesterday's MRI.
2. Mildly prominent LEFT paraspinal muscles corresponding to known
abscess.
3. 3 mm nonobstructing LEFT nephrolithiasis.

## 2019-06-27 IMAGING — US US GUIDANCE NEEDLE PLACEMENT
1 series · 6 of 6 positions shown · non-contrast
Comparison: MR 09/05/2017

CLINICAL DATA: History of spinal infection. Recent MR demonstrates
left superficial paraspinal abscess.

EXAM:
IR ULTRASOUND GUIDED ASPIRATION
TECHNIQUE: The procedure, risks (including but not limited to bleeding,
infection, organ damage ), benefits, and alternatives were explained
to the patient. Questions regarding the procedure were encouraged
and answered. The patient understands and consents to the procedure.

[Series 1: us guidance needle placement · 0.05mm/px · 6 of 6 slices shown]
[im 1/6]
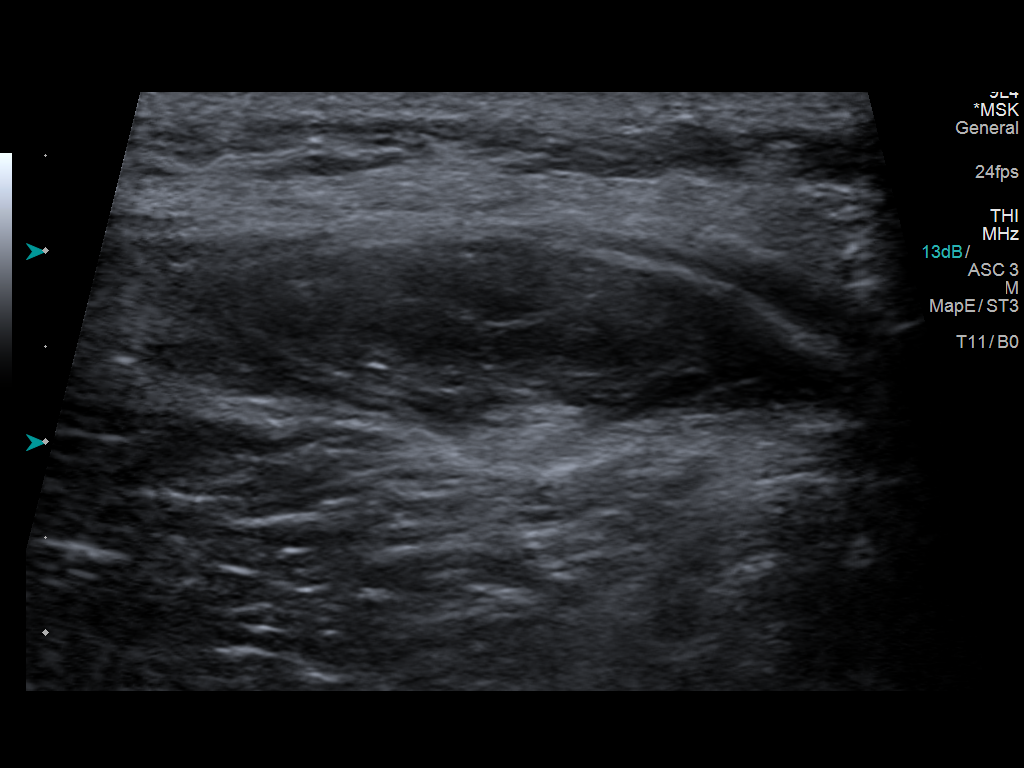
[im 2/6]
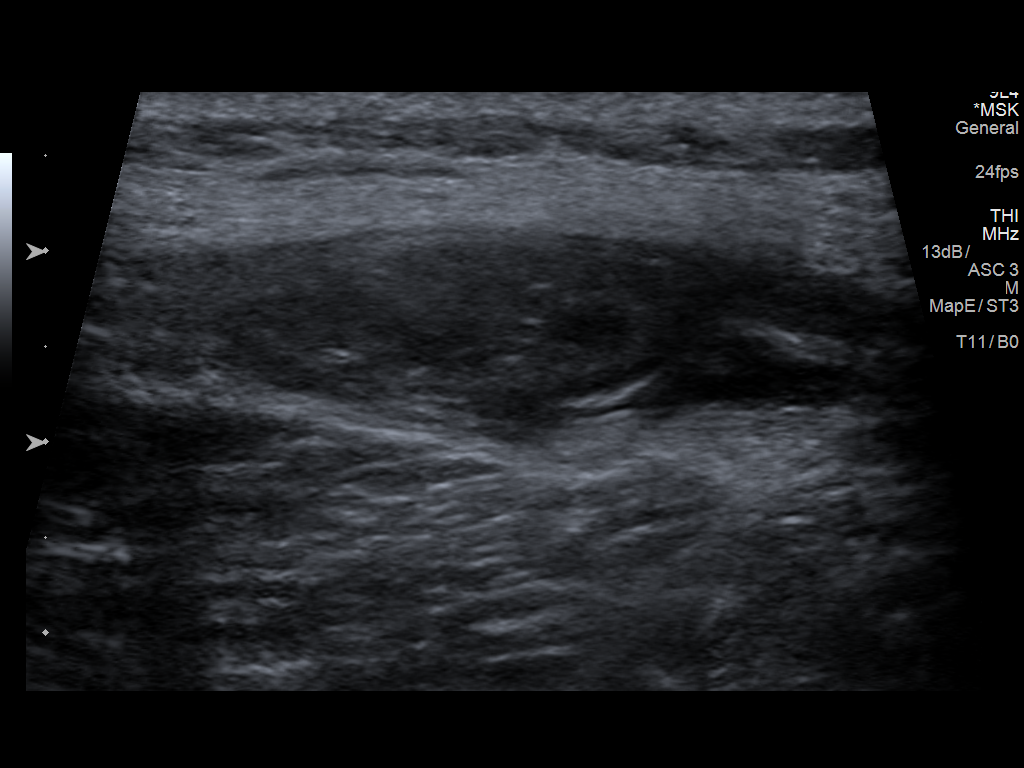
[im 3/6]
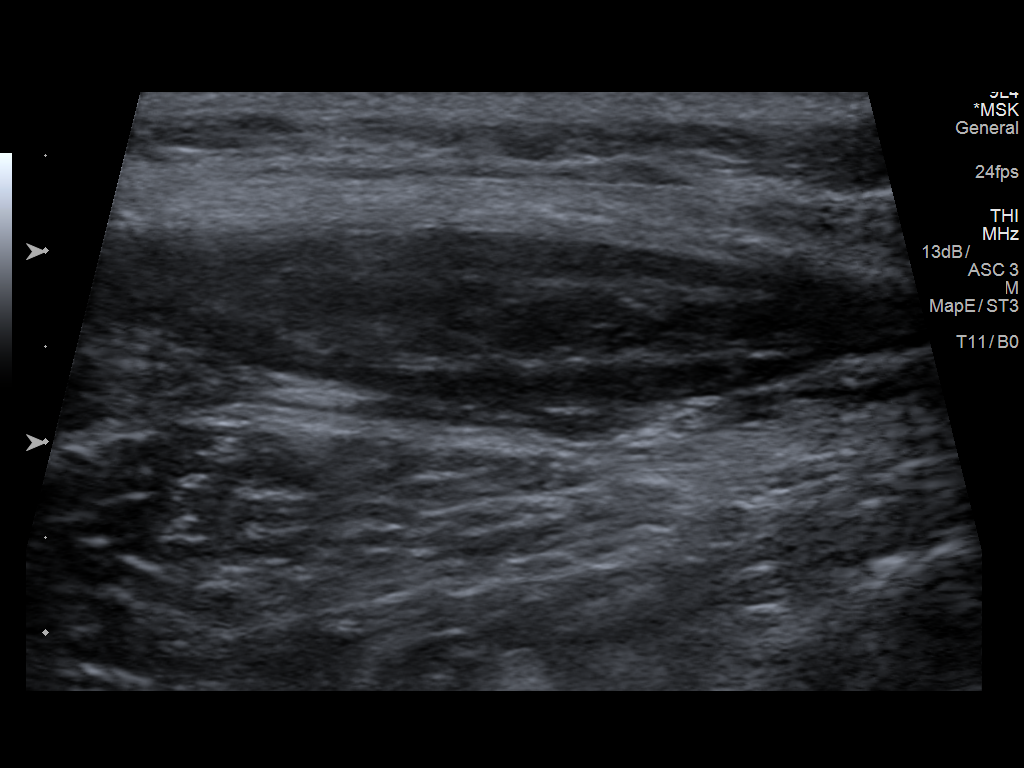
[im 4/6]
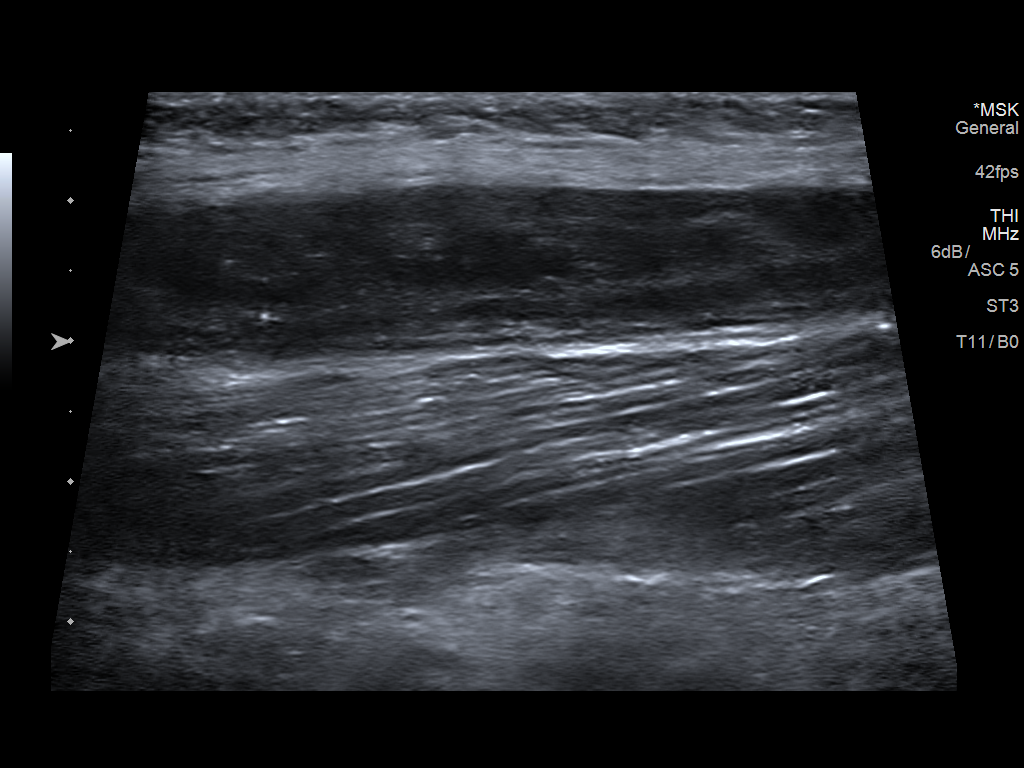
[im 5/6]
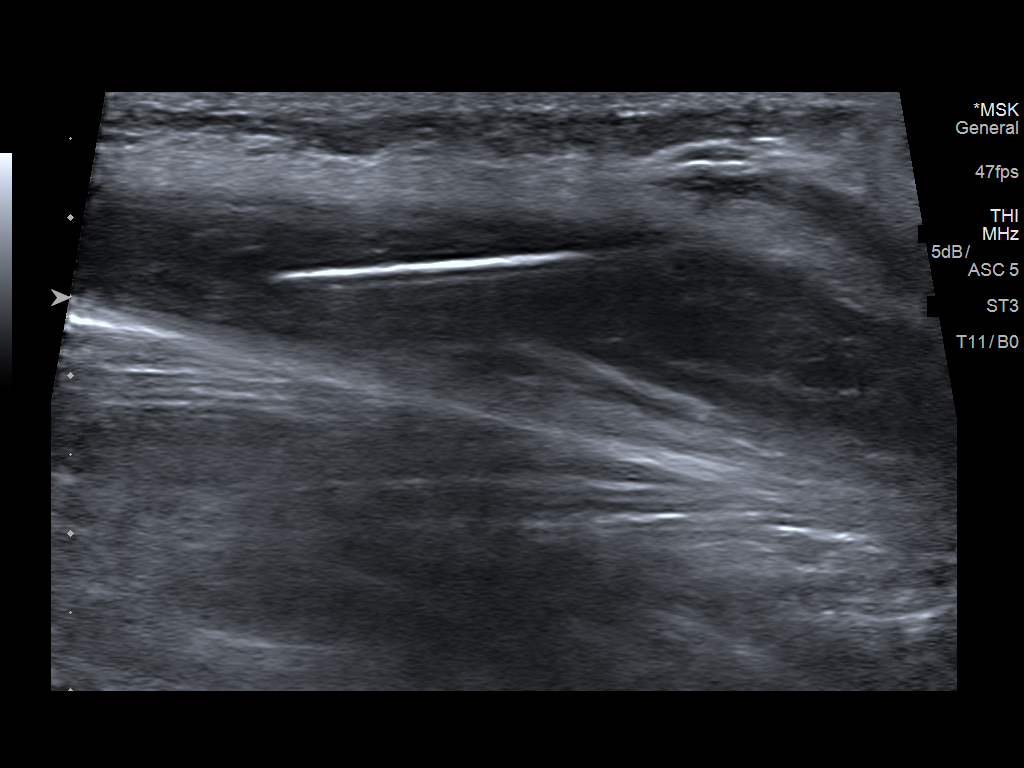
[im 6/6]
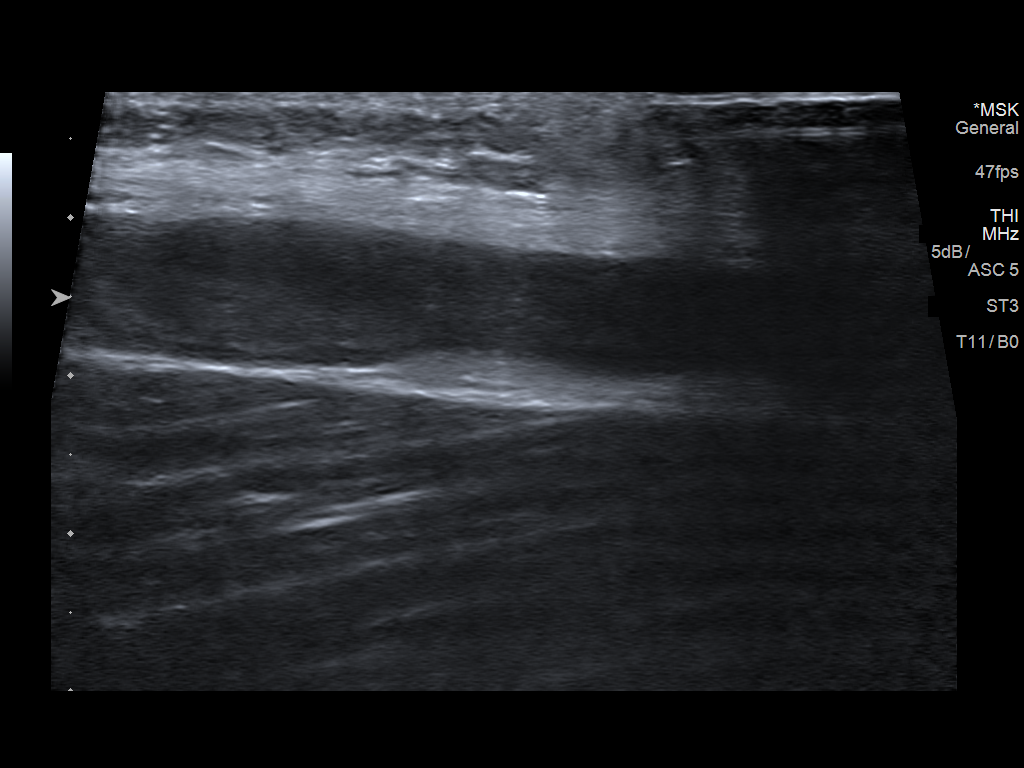

[6 of 6 positions shown; findings below may reference images not displayed]

Intravenous Fentanyl and Versed were administered as conscious
sedation during continuous monitoring of the patient's level of
consciousness and physiological / cardiorespiratory status by the
radiology RN, with a total moderate sedation time of 10 minutes.

Survey ultrasound of the left paraspinal region was performed and
the linear fluid collection was localized. Site was marked, prepped
with chlorhexidine, draped in usual sterile fashion, infiltrated
locally with 1% lidocaine. Under real-time ultrasound guidance, an
18 gauge spinal needle was advanced into the collection.
Approximately 1 mL of turbid serosanguineous fluid was aspirated,
sent for Gram stain and culture. Postprocedure scans show no
hemorrhage or other apparent complication. The patient tolerated the
procedure well.
IMPRESSION: 1. Technically successful aspiration of left superficial lumbar
paraspinal abscess, sample sent for Gram stain and culture.

## 2019-07-13 ENCOUNTER — Telehealth: Payer: Self-pay | Admitting: *Deleted

## 2019-07-13 ENCOUNTER — Ambulatory Visit: Payer: Medicare Other | Admitting: Diagnostic Neuroimaging

## 2019-07-13 ENCOUNTER — Encounter: Payer: Self-pay | Admitting: Diagnostic Neuroimaging

## 2019-07-13 NOTE — Telephone Encounter (Signed)
Patient was no show for follow up today. 

## 2019-07-30 ENCOUNTER — Other Ambulatory Visit: Payer: Self-pay | Admitting: Diagnostic Neuroimaging

## 2019-10-27 ENCOUNTER — Telehealth: Payer: Self-pay | Admitting: *Deleted

## 2019-10-27 NOTE — Telephone Encounter (Signed)
LVM on home # advising her she needs a follow up prior to next Ocrevus infusion in July. I sent my chart as well.

## 2019-10-28 NOTE — Telephone Encounter (Addendum)
Patient has scheduled appointment on 11/10/19 for FU- [facility], Ocrevus infusion.

## 2019-11-10 ENCOUNTER — Encounter: Payer: Self-pay | Admitting: Diagnostic Neuroimaging

## 2019-11-10 ENCOUNTER — Other Ambulatory Visit: Payer: Self-pay

## 2019-11-10 ENCOUNTER — Ambulatory Visit (INDEPENDENT_AMBULATORY_CARE_PROVIDER_SITE_OTHER): Payer: Medicare Other | Admitting: Diagnostic Neuroimaging

## 2019-11-10 VITALS — BP 114/75 | HR 80 | Ht 67.0 in | Wt 171.2 lb

## 2019-11-10 DIAGNOSIS — G35 Multiple sclerosis: Secondary | ICD-10-CM
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
## 2019-11-11 ENCOUNTER — Telehealth: Payer: Self-pay | Admitting: Diagnostic Neuroimaging

## 2019-11-11 NOTE — Telephone Encounter (Signed)
Medicare/[employer] auth: [employer] via [employer] website order sent to GI. They will reach out to the patient to schedule.  

## 2019-11-20 ENCOUNTER — Emergency Department (INDEPENDENT_AMBULATORY_CARE_PROVIDER_SITE_OTHER)
Admission: RE | Admit: 2019-11-20 | Discharge: 2019-11-20 | Disposition: A | Discharge status: Home / Self Care | Payer: Medicare Other | Source: Ambulatory Visit | Attending: Family Medicine | Admitting: Family Medicine

## 2019-11-20 ENCOUNTER — Other Ambulatory Visit: Payer: Self-pay

## 2019-11-20 VITALS — BP 138/83 | HR 65 | Temp 98.9°F | Resp 16

## 2019-11-20 DIAGNOSIS — T24222A Burn of second degree of left knee, initial encounter: Secondary | ICD-10-CM

## 2019-11-20 MED ORDER — ACETAMINOPHEN 325 MG PO TABS
650.0000 mg | ORAL_TABLET | Freq: Once | ORAL | Status: AC
  Administered 2019-11-20: 650 mg via ORAL

## 2019-11-20 NOTE — Discharge Instructions (Signed)
Leave bandage in place until follow-up visit in two days.  Keep bandage clean and dry.  You may take Tylenol 2 or 3 times daily with your regular hydromorphone dose for extra pain relief.
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
## 2019-11-21 ENCOUNTER — Telehealth: Payer: Self-pay | Admitting: Emergency Medicine
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
## 2019-11-22 ENCOUNTER — Ambulatory Visit
Admission: RE | Admit: 2019-11-22 | Discharge: 2019-11-22 | Disposition: A | Discharge status: Home / Self Care | Payer: Medicare Other | Source: Ambulatory Visit | Attending: Diagnostic Neuroimaging | Admitting: Diagnostic Neuroimaging

## 2019-11-22 ENCOUNTER — Emergency Department (INDEPENDENT_AMBULATORY_CARE_PROVIDER_SITE_OTHER)
Admission: RE | Admit: 2019-11-22 | Discharge: 2019-11-22 | Disposition: A | Discharge status: Home / Self Care | Payer: Medicare Other | Source: Ambulatory Visit

## 2019-11-22 ENCOUNTER — Other Ambulatory Visit: Payer: Self-pay

## 2019-11-22 VITALS — BP 110/72 | HR 60 | Temp 98.4°F

## 2019-11-22 DIAGNOSIS — J4521 Mild intermittent asthma with (acute) exacerbation: Secondary | ICD-10-CM | POA: Diagnosis not present

## 2019-11-22 DIAGNOSIS — G35 Multiple sclerosis: Secondary | ICD-10-CM | POA: Diagnosis not present

## 2019-11-22 DIAGNOSIS — R062 Wheezing: Secondary | ICD-10-CM | POA: Diagnosis not present

## 2019-11-22 DIAGNOSIS — T24222D Burn of second degree of left knee, subsequent encounter: Secondary | ICD-10-CM | POA: Diagnosis not present

## 2019-11-22 DIAGNOSIS — Z760 Encounter for issue of repeat prescription: Secondary | ICD-10-CM | POA: Diagnosis not present

## 2019-11-22 MED ORDER — ALBUTEROL SULFATE HFA 108 (90 BASE) MCG/ACT IN AERS: 1.0000 | INHALATION_SPRAY | Freq: Four times a day (QID) | RESPIRATORY_TRACT | 0 refills | Status: DC | PRN

## 2019-11-22 MED ORDER — ALBUTEROL SULFATE (2.5 MG/3ML) 0.083% IN NEBU: 2.5000 mg | INHALATION_SOLUTION | Freq: Four times a day (QID) | RESPIRATORY_TRACT | 0 refills | Status: DC | PRN
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
## 2019-11-22 NOTE — Discharge Instructions (Signed)
  Please keep bandage in place for the next 3 days until follow up in Urgent Care on Wednesday for bandage change and another wound recheck.    You were also found to have a lower than normal oxygen level and wheeze on exam, likely due to your asthma.  You can use the albuterol as prescribed.  Please be re-seen sooner than Wednesday if you develop trouble breathing, fever, vomiting, or other new concerning symptoms develop.

## 2019-11-24 ENCOUNTER — Telehealth: Payer: Self-pay | Admitting: *Deleted

## 2019-11-24 ENCOUNTER — Telehealth: Payer: Self-pay | Admitting: Diagnostic Neuroimaging

## 2019-11-24 NOTE — Telephone Encounter (Signed)
Pt called wanting to know if her brain scan results are in. Please advise.
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
## 2019-11-24 NOTE — Telephone Encounter (Signed)
Recommend to delay ocrevus for 2 more weeks. -VRP

## 2019-11-25 ENCOUNTER — Other Ambulatory Visit: Payer: Self-pay

## 2019-11-25 ENCOUNTER — Emergency Department (INDEPENDENT_AMBULATORY_CARE_PROVIDER_SITE_OTHER)
Admission: EM | Admit: 2019-11-25 | Discharge: 2019-11-25 | Disposition: A | Discharge status: Home / Self Care | Payer: Medicare Other | Source: Ambulatory Visit | Attending: Family Medicine | Admitting: Family Medicine

## 2019-11-25 VITALS — BP 107/73 | HR 75 | Temp 98.0°F | Resp 16

## 2019-11-25 DIAGNOSIS — Z4800 Encounter for change or removal of nonsurgical wound dressing: Secondary | ICD-10-CM

## 2019-11-25 MED ORDER — MUPIROCIN 2 % EX OINT: TOPICAL_OINTMENT | CUTANEOUS | 0 refills | Status: DC

## 2019-11-25 NOTE — Telephone Encounter (Signed)
Sent my chart, stable MRI from 2019.

## 2019-11-25 NOTE — Discharge Instructions (Addendum)
Leave Mepelex Border dressing on for three days (keep bandage clean and dry).  Then begin daily dressing changes with non-stick Telfa bandages.  May use mupirocin ointment with each bandage change.

## 2019-11-25 NOTE — Telephone Encounter (Signed)
Spoke with Liane, RN infusion suite and advised Dr Marjory Lies wants Ocrevus infusion delayed two weeks past current scheduled date. Liane  verbalized understanding, appreciation, stated she will call patient to advise her and reschedule infusion.
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
## 2020-01-05 ENCOUNTER — Other Ambulatory Visit: Payer: Self-pay

## 2020-01-05 ENCOUNTER — Emergency Department (INDEPENDENT_AMBULATORY_CARE_PROVIDER_SITE_OTHER)
Admission: EM | Admit: 2020-01-05 | Discharge: 2020-01-05 | Disposition: A | Discharge status: Home / Self Care | Payer: Medicare Other | Source: Home / Self Care | Attending: Family Medicine | Admitting: Family Medicine

## 2020-01-05 DIAGNOSIS — M79642 Pain in left hand: Secondary | ICD-10-CM

## 2020-01-05 DIAGNOSIS — T310 Burns involving less than 10% of body surface: Secondary | ICD-10-CM | POA: Diagnosis not present

## 2020-01-05 MED ORDER — CEPHALEXIN 500 MG PO CAPS
500.0000 mg | ORAL_CAPSULE | Freq: Two times a day (BID) | ORAL | 0 refills | Status: DC
Start: 2020-01-05 — End: 2020-06-21

## 2020-01-05 NOTE — Discharge Instructions (Signed)
Please try to cover the area and change the dressing.  Please try the antibiotic.  Please follow up here in 2 days.  I have made a referral to the wound care.  Please follow up if your symptoms fail to improve.
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
## 2020-06-21 ENCOUNTER — Encounter: Payer: Self-pay | Admitting: Diagnostic Neuroimaging

## 2020-06-21 ENCOUNTER — Telehealth: Payer: Self-pay | Admitting: *Deleted

## 2020-06-21 ENCOUNTER — Ambulatory Visit (INDEPENDENT_AMBULATORY_CARE_PROVIDER_SITE_OTHER): Payer: Medicare Other | Admitting: Diagnostic Neuroimaging

## 2020-06-21 VITALS — BP 110/77 | HR 63 | Ht 67.0 in | Wt 183.0 lb

## 2020-06-21 DIAGNOSIS — G35 Multiple sclerosis: Secondary | ICD-10-CM | POA: Diagnosis not present
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
## 2020-06-21 NOTE — Patient Instructions (Signed)
-   continue ocrevus

## 2020-06-21 NOTE — Telephone Encounter (Addendum)
Per Loralyn Freshwater RN, the patient called [id number] and found out that her co pay was more than she can afford.  Per Dr Marjory Lies, patient should apply for free drug, will then receive Ocrevus off site. I called patient, LVM informing her of this, advised she call [facility], [phone] and ask how to apply for free drug. Requested she call us with update.  Yesterday - Per Liane RN, [employer] called, stated patient is approved for free drug.  06/23/20 Called [employer] ocrevus access solutions, [phone], opt 4 spoke with rep who stated this case is being processed by Christiane Ha who isn't in office yet. She will have him call when he gets in.

## 2020-06-23 NOTE — Telephone Encounter (Addendum)
Christiane Ha with [facility] called back, stated that he needs to know infusion site, address. Gave me information, address, phone #. He will fax letter re: free drug. Called [facility], spoke with [facility] who stated they can infuse patient 06/28/20, Tues 10 am. I advised will inform patient and have her call them if she is unable to make that appointment. Called patient; she was on phone with [facility], stated she would call back.

## 2020-06-23 NOTE — Telephone Encounter (Addendum)
MD not in office to sign Ocrevus orders. Received verbal orders via phone, orders signed. Called [place], spoke with Derek Mound to request approval letter again. He stated he will have Christiane Ha fax over asap.  Received Ocrevus free drug letter, approval date 06/23/20. Faxed Ocrevus orders, med/allergy list and Database administrator letter to [facility]. Received confirmation.

## 2020-06-23 NOTE — Telephone Encounter (Deleted)
Per Freddi Starr RN, she received call from [facility] and patient is approved for free drug,  Ocrevus.

## 2020-06-23 NOTE — Telephone Encounter (Signed)
Patient called back, I informed her of appointment 06/28/20, 10 am. She stated she can make that date, time. I gave her their address, phone #. I advised will get orders faxed over today. Patient verbalized understanding, appreciation. Orders on MD desk for review, signature.

## 2020-06-27 NOTE — Telephone Encounter (Signed)
Patient is scheduled for O[facility]us infusion tomorrow at [facility].

## 2020-06-28 ENCOUNTER — Other Ambulatory Visit: Payer: Self-pay

## 2020-06-28 ENCOUNTER — Non-Acute Institutional Stay (HOSPITAL_COMMUNITY)
Admission: RE | Admit: 2020-06-28 | Discharge: 2020-06-28 | Disposition: A | Discharge status: Home / Self Care | Payer: Medicare Other | Source: Ambulatory Visit | Attending: Internal Medicine | Admitting: Internal Medicine

## 2020-06-28 DIAGNOSIS — G35 Multiple sclerosis: Secondary | ICD-10-CM | POA: Diagnosis present

## 2020-06-28 MED ORDER — FAMOTIDINE IN NACL 20-0.9 MG/50ML-% IV SOLN
20.0000 mg | Freq: Once | INTRAVENOUS | Status: AC
  Administered 2020-06-28: 20 mg via INTRAVENOUS

## 2020-06-28 MED ORDER — ACETAMINOPHEN 325 MG PO TABS
650.0000 mg | ORAL_TABLET | Freq: Once | ORAL | Status: AC
  Administered 2020-06-28: 650 mg via ORAL
  Filled 2020-06-28: qty 2

## 2020-06-28 MED ORDER — FAMOTIDINE IN NACL 20-0.9 MG/50ML-% IV SOLN
20.0000 mg | Freq: Once | INTRAVENOUS | Status: DC
  Filled 2020-06-28: qty 50

## 2020-06-28 MED ORDER — METHYLPREDNISOLONE SODIUM SUCC 125 MG IJ SOLR
125.0000 mg | Freq: Once | INTRAMUSCULAR | Status: AC
  Administered 2020-06-28: 125 mg via INTRAVENOUS

## 2020-06-28 MED ORDER — SODIUM CHLORIDE 0.9 % IV SOLN
INTRAVENOUS | Status: DC | PRN
  Administered 2020-06-28: 250 mL via INTRAVENOUS

## 2020-06-28 MED ORDER — SODIUM CHLORIDE 0.9 % IV SOLN
600.0000 mg | Freq: Once | INTRAVENOUS | Status: AC
  Administered 2020-06-28: 600 mg via INTRAVENOUS
  Filled 2020-06-28: qty 20

## 2020-06-28 MED ORDER — DIPHENHYDRAMINE HCL 50 MG/ML IJ SOLN
50.0000 mg | Freq: Once | INTRAMUSCULAR | Status: AC
  Administered 2020-06-28: 50 mg via INTRAVENOUS
  Filled 2020-06-28: qty 1

## 2020-06-28 MED ORDER — METHYLPREDNISOLONE SODIUM SUCC 125 MG IJ SOLR
100.0000 mg | Freq: Once | INTRAMUSCULAR | Status: DC
  Filled 2020-06-28: qty 2

## 2020-06-28 NOTE — Discharge Instructions (Signed)
Ocrelizumab injection What is this medicine? OCRELIZUMAB (ok re LIZ ue mab) treats multiple sclerosis. It helps to decrease the number of multiple sclerosis relapses. It is not a cure. This medicine may be used for other purposes; ask your health care provider or pharmacist if you have questions. COMMON BRAND NAME(S): OCREVUS What should I tell my health care provider before I take this medicine? They need to know if you have any of these conditions:  cancer  hepatitis B infection  other infection (especially a virus infection such as chickenpox, cold sores, or herpes)  an unusual or allergic reaction to ocrelizumab, other medicines, foods, dyes or preservatives  pregnant or trying to get pregnant  breast-feeding How should I use this medicine? This medicine is for infusion into a vein. It is given by a health care professional in a hospital or clinic setting. A special MedGuide will be given to you before each treatment. Be sure to read this information carefully each time. Talk to your pediatrician regarding the use of this medicine in children. Special care may be needed. Overdosage: If you think you have taken too much of this medicine contact a poison control center or emergency room at once. NOTE: This medicine is only for you. Do not share this medicine with others. What if I miss a dose? Keep appointments for follow-up doses as directed. It is important not to miss your dose. Call your doctor or health care professional if you are unable to keep an appointment. What may interact with this medicine?  alemtuzumab  daclizumab  dimethyl fumarate  fingolimod  glatiramer  interferon beta  live virus vaccines  mitoxantrone  natalizumab  peginterferon beta  rituximab  steroid medicines like prednisone or cortisone  teriflunomide This list may not describe all possible interactions. Give your health care provider a list of all the medicines, herbs,  non-prescription drugs, or dietary supplements you use. Also tell them if you smoke, drink alcohol, or use illegal drugs. Some items may interact with your medicine. What should I watch for while using this medicine? Your condition will be monitored carefully while you are receiving this drug. This drug can cause serious allergic reactions. To reduce your risk, you may need to take drug before treatment with this drug. Take your drug as directed. Do not become pregnant while taking this drug or for 6 months after stopping it. Women should inform their health care provider if they wish to become pregnant or think they might be pregnant. There is potential for serious harm to an unborn child. Talk to your health care provider for more information. This drug may increase your risk of getting an infection. Call your health care provider for advice if you get a fever, chills, or sore throat, or other symptoms of a cold or flu. Do not treat yourself. Try to avoid being around people who are sick. If you have hepatitis B, talk to your health care provider if you plan to stop this drug. The symptoms of hepatitis B may get worse if you stop this drug. In some patients, this drug may cause a serious brain infection that may cause death. If you have any problems seeing, thinking, speaking, walking, or standing, tell your health care provider right away. If you cannot reach your health care provider, urgently seek other source of medical care. This drug can decrease the response to a vaccine. If you need to get vaccinated, tell your health care provider if you have received this drug. Extra   booster doses may be needed. Talk to your health care provider to see if a different vaccination schedule is needed. Talk to your health care provider about your risk of cancer. You may be more at risk for certain types of cancer if you take this drug. What side effects may I notice from receiving this medicine? Side effects that  you should report to your doctor or health care professional as soon as possible:  allergic reactions (skin rash, itching or hives; swelling of the face, lips, or tongue)  facial flushing  fast, irregular heartbeat  infection (fever, chills, cough, sore throat, pain or trouble passing urine)  liver injury (dark yellow or brown urine; general ill feeling or flu-like symptoms; loss of appetite, right upper belly pain; unusually weak or tired, yellowing of the eyes or skin)  low blood pressure (dizziness; feeling faint or lightheaded, falls; unusually weak or tired)  swelling of the ankles, feet, hands  trouble breathing Side effects that usually do not require medical attention (report these to your doctor or health care professional if they continue or are bothersome):  back pain  depressed mood  diarrhea  pain, redness, or irritation at site where injected This list may not describe all possible side effects. Call your doctor for medical advice about side effects. You may report side effects to FDA at 1-800-FDA-1088. Where should I keep my medicine? This drug is given in a hospital or clinic and will not be stored at home. NOTE: This sheet is a summary. It may not cover all possible information. If you have questions about this medicine, talk to your doctor, pharmacist, or health care provider.  2021 Elsevier/Gold Standard (2019-03-31 13:09:45)  

## 2020-06-28 NOTE — Progress Notes (Addendum)
Patient received IV ocrevus as ordered by Joycelyn Schmid MD. Pre infusion medications - IV Famotidine, IV Benadryl , and  IV Solu-medrol were given in addition to PO Tylenol. Pt states that at her previous infusion, she required an additional dose of Benadryl halfway through infusion, as she experienced itching in her throat. Colen Darling RN, Dr. Richrd Humbles RN, was contacted via Epic secure chat to obtain verbal order for 25mg  IV Benadryl in the event pt experienced side effect with infusion. Verbal order given by Dr. and instructed that this could remain a standing order for additonal 25 mg IV Benadryl for future Ocrevus infusions. Medication was titrated per protocol. Pt did not c/o itching in throat and required no additional medications.  It was explained to pt that it is recommended to stay for observation for 1 hour post infusion, pt declined to stay for observation. Tolerated well, vitals stable, BP 157/94 after infusion, pt states she forgot to take her BP meds this morning and will take as soon as she gets home, denies any s/s hypertension, discharge instructions given, verbalized understanding. Patient alert, oriented and ambulatory at the time of discharge

## 2020-07-06 NOTE — Telephone Encounter (Signed)
Amy from [facility] called & states that they need a new prescription for Ccrevus for pt..  Best contact: [phone]

## 2020-07-06 NOTE — Telephone Encounter (Addendum)
Called [facility] pharmacy,  spoke with Martie Lee who stated they are pharmacy that works with [facility] for free drug through [employer]. She stated a new Ocrevus Rx is needed before the patient's next infusion in 6 months. She advised [facility] call [facility] @ [phone] 2 weeks before medication is needed to get it shipped to them. I advised will call [facility] and e scribe Ocrevus to them. She verbalized understanding, appreciation. Called [facility] and spoke with Asgio RN who stated the pharmacy handles this, advised I call them. Called pharmacy, [phone], spoke with Kenard Gower who stated the [facility] buyer is [place] @  [phone] but she has gone for the day.

## 2020-07-07 NOTE — Telephone Encounter (Signed)
Called  pharmacy for [facility], spoke with Inetta Fermo and advised her of my conversation with [facility].  She  stated they used stock for patient's 06/28/20 Ocrevus infusion, and she'll call them for a replacement. She'll be sure patient isn't charged for services that day. She will call [employer] for next Ocrevus dose due in 6 months. She verbalized understanding, appreciation.

## 2020-07-13 ENCOUNTER — Telehealth: Payer: Self-pay | Admitting: *Deleted

## 2020-07-13 NOTE — Telephone Encounter (Signed)
Form signed by Dr Marjory Lies and faxed to [facility] HIM dept.

## 2020-07-13 NOTE — Telephone Encounter (Addendum)
Received fax from [facility] HIM dept re: Ocrevus 600 mg/500 mL infusion order to be signed. Placed on MD desk for signature.

## 2021-01-17 ENCOUNTER — Telehealth: Payer: Self-pay

## 2021-01-17 NOTE — Telephone Encounter (Signed)
Patient is due for her Ocrevus infusion.  This is not scheduled yet.  I called patient to remind her to call the [facility] to schedule this.  No answer, no voicemail set up.  I will send her a MyChart message.

## 2021-02-21 ENCOUNTER — Ambulatory Visit: Payer: Medicare Other | Admitting: Diagnostic Neuroimaging

## 2021-02-28 ENCOUNTER — Ambulatory Visit (INDEPENDENT_AMBULATORY_CARE_PROVIDER_SITE_OTHER): Payer: Medicare Other | Admitting: Diagnostic Neuroimaging

## 2021-02-28 ENCOUNTER — Other Ambulatory Visit: Payer: Self-pay

## 2021-02-28 ENCOUNTER — Encounter: Payer: Self-pay | Admitting: Diagnostic Neuroimaging

## 2021-02-28 VITALS — BP 109/67 | HR 68 | Ht 67.0 in | Wt 178.0 lb

## 2021-02-28 DIAGNOSIS — G35 Multiple sclerosis: Secondary | ICD-10-CM | POA: Diagnosis not present

## 2021-02-28 DIAGNOSIS — E559 Vitamin D deficiency, unspecified: Secondary | ICD-10-CM
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
## 2021-03-06 ENCOUNTER — Telehealth: Payer: Self-pay | Admitting: *Deleted

## 2021-03-06 ENCOUNTER — Other Ambulatory Visit (HOSPITAL_COMMUNITY): Payer: Self-pay

## 2021-03-06 NOTE — Telephone Encounter (Signed)
Received fax from [facility] re: Ocrevus Rx. Placed on MD desk for completion, signature.

## 2021-03-07 ENCOUNTER — Telehealth: Payer: Self-pay | Admitting: *Deleted

## 2021-03-07 NOTE — Telephone Encounter (Signed)
[facility] orders for Ocrevus completed, signed and faxed back. Received confirmation.

## 2021-03-07 NOTE — Telephone Encounter (Signed)
Ocrevus orders signed and [facility] to [facility]. Received confirmation.

## 2021-03-07 NOTE — Telephone Encounter (Signed)
Received message from Clydene Fake, RN with [facility] re: need new Ocrevus orders. Patient is scheduled for infusion tomorrow. New orders on Dr [facility] desk for review, signature.

## 2021-03-08 ENCOUNTER — Non-Acute Institutional Stay (HOSPITAL_COMMUNITY)
Admission: RE | Admit: 2021-03-08 | Discharge: 2021-03-08 | Disposition: A | Discharge status: Home / Self Care | Payer: Medicare Other | Source: Ambulatory Visit | Attending: Internal Medicine | Admitting: Internal Medicine

## 2021-03-08 ENCOUNTER — Other Ambulatory Visit: Payer: Self-pay

## 2021-03-08 DIAGNOSIS — G35 Multiple sclerosis: Secondary | ICD-10-CM | POA: Insufficient documentation

## 2021-03-08 LAB — IMMUNOGLOBULINS A/E/G/M, SERUM
IgA/Immunoglobulin A, Serum: 100 mg/dL (ref 87–352)
IgE (Immunoglobulin E), Serum: 11 IU/mL (ref 6–495)
IgG (Immunoglobin G), Serum: 1067 mg/dL (ref 586–1602)
IgM (Immunoglobulin M), Srm: 97 mg/dL (ref 26–217)

## 2021-03-08 LAB — COMPREHENSIVE METABOLIC PANEL
ALT: 21 IU/L (ref 0–32)
AST: 26 IU/L (ref 0–40)
Albumin/Globulin Ratio: 2 (ref 1.2–2.2)
Albumin: 4.5 g/dL (ref 3.8–4.9)
Alkaline Phosphatase: 80 IU/L (ref 44–121)
BUN/Creatinine Ratio: 18 (ref 9–23)
BUN: 18 mg/dL (ref 6–24)
Bilirubin Total: <0.2 mg/dL (ref 0.0–1.2)
CO2: 25 mmol/L (ref 20–29)
Calcium: 9.6 mg/dL (ref 8.7–10.2)
Chloride: 101 mmol/L (ref 96–106)
Creatinine, Ser: 0.99 mg/dL (ref 0.57–1.00)
Globulin, Total: 2.2 g/dL (ref 1.5–4.5)
Glucose: 91 mg/dL (ref 70–99)
Potassium: 3.5 mmol/L (ref 3.5–5.2)
Sodium: 141 mmol/L (ref 134–144)
Total Protein: 6.7 g/dL (ref 6.0–8.5)
eGFR: 69 mL/min/{1.73_m2} (ref >59)

## 2021-03-08 LAB — CBC WITH DIFFERENTIAL/PLATELET
Basophils Absolute: 0.1 10*3/uL (ref 0.0–0.2)
Basos: 1 %
EOS (ABSOLUTE): 0.4 10*3/uL (ref 0.0–0.4)
Eos: 7 %
Hematocrit: 40 % (ref 34.0–46.6)
Hemoglobin: 13.7 g/dL (ref 11.1–15.9)
Immature Grans (Abs): 0 10*3/uL (ref 0.0–0.1)
Immature Granulocytes: 0 %
Lymphocytes Absolute: 2.2 10*3/uL (ref 0.7–3.1)
Lymphs: 34 %
MCH: 33.7 pg — ABNORMAL HIGH (ref 26.6–33.0)
MCHC: 34.3 g/dL (ref 31.5–35.7)
MCV: 98 fL — ABNORMAL HIGH (ref 79–97)
Monocytes Absolute: 1 10*3/uL — ABNORMAL HIGH (ref 0.1–0.9)
Monocytes: 15 %
Neutrophils Absolute: 2.7 10*3/uL (ref 1.4–7.0)
Neutrophils: 43 %
Platelets: 167 10*3/uL (ref 150–450)
RBC: 4.07 x10E6/uL (ref 3.77–5.28)
RDW: 11.4 % — ABNORMAL LOW (ref 11.7–15.4)
WBC: 6.4 10*3/uL (ref 3.4–10.8)

## 2021-03-08 LAB — ANA,IFA RA DIAG PNL W/RFLX TIT/PATN
ANA Titer 1: NEGATIVE
Cyclic Citrullin Peptide Ab: 4 units (ref 0–19)
Rheumatoid fact SerPl-aCnc: <10 IU/mL (ref <14.0)

## 2021-03-08 LAB — VITAMIN D 25 HYDROXY (VIT D DEFICIENCY, FRACTURES): Vit D, 25-Hydroxy: 67.7 ng/mL (ref 30.0–100.0)

## 2021-03-08 MED ORDER — FAMOTIDINE IN NACL 20-0.9 MG/50ML-% IV SOLN
20.0000 mg | Freq: Once | INTRAVENOUS | Status: AC
  Administered 2021-03-08: 20 mg via INTRAVENOUS
  Filled 2021-03-08: qty 50

## 2021-03-08 MED ORDER — DIPHENHYDRAMINE HCL 50 MG/ML IJ SOLN
50.0000 mg | Freq: Once | INTRAMUSCULAR | Status: AC
  Administered 2021-03-08: 50 mg via INTRAVENOUS
  Filled 2021-03-08: qty 1

## 2021-03-08 MED ORDER — ACETAMINOPHEN 325 MG PO TABS
650.0000 mg | ORAL_TABLET | Freq: Once | ORAL | Status: AC
  Administered 2021-03-08: 650 mg via ORAL
  Filled 2021-03-08: qty 2

## 2021-03-08 MED ORDER — METHYLPREDNISOLONE SODIUM SUCC 125 MG IJ SOLR
125.0000 mg | Freq: Once | INTRAMUSCULAR | Status: AC
  Administered 2021-03-08: 125 mg via INTRAVENOUS
  Filled 2021-03-08: qty 2

## 2021-03-08 MED ORDER — SODIUM CHLORIDE 0.9 % IV SOLN
600.0000 mg | Freq: Once | INTRAVENOUS | Status: AC
  Administered 2021-03-08: 600 mg via INTRAVENOUS
  Filled 2021-03-08: qty 20

## 2021-03-08 MED ORDER — SODIUM CHLORIDE 0.9 % IV SOLN: INTRAVENOUS | Status: DC | PRN

## 2021-03-08 NOTE — Progress Notes (Signed)
Patient received IV Ocrevus  600mg  as ordered by MD. Pre infusion medications - Tylenol PO, Benadryl IV, Solu-medrol IV and Pepcid IV were given. Medication was titrated per protocol. Patient declined to wait for the 60 minutes post infusion observation. Tolerated well, vitals stable, discharge instructions given, verbalized understanding. Pt instructed to schedule next infusion for 6 months prior to leaving, verbalized understanding. Patient alert, oriented and ambulatory at the time of discharge

## 2021-03-15 ENCOUNTER — Ambulatory Visit
Admission: RE | Admit: 2021-03-15 | Discharge: 2021-03-15 | Disposition: A | Discharge status: Home / Self Care | Payer: Medicare Other | Source: Ambulatory Visit | Attending: Diagnostic Neuroimaging | Admitting: Diagnostic Neuroimaging

## 2021-03-15 ENCOUNTER — Other Ambulatory Visit: Payer: Self-pay

## 2021-03-15 DIAGNOSIS — G35 Multiple sclerosis: Secondary | ICD-10-CM | POA: Diagnosis not present

## 2021-03-15 MED ORDER — GADOBENATE DIMEGLUMINE 529 MG/ML IV SOLN
15.0000 mL | Freq: Once | INTRAVENOUS | Status: AC | PRN
  Administered 2021-03-15: 15 mL via INTRAVENOUS

## 2021-03-16 ENCOUNTER — Telehealth: Payer: Self-pay | Admitting: *Deleted

## 2021-03-20 NOTE — Telephone Encounter (Signed)
MRI was done on the 2nd, please advise

## 2021-03-22 NOTE — Telephone Encounter (Signed)
Sent my chart with results of labs, MRI per MD.

## 2021-04-11 ENCOUNTER — Other Ambulatory Visit (HOSPITAL_COMMUNITY): Payer: Self-pay

## 2021-08-02 ENCOUNTER — Other Ambulatory Visit (HOSPITAL_COMMUNITY): Payer: Self-pay

## 2021-08-29 ENCOUNTER — Encounter: Payer: Self-pay | Admitting: Diagnostic Neuroimaging

## 2021-08-29 ENCOUNTER — Ambulatory Visit (INDEPENDENT_AMBULATORY_CARE_PROVIDER_SITE_OTHER): Payer: Medicare Other | Admitting: Diagnostic Neuroimaging

## 2021-08-29 ENCOUNTER — Encounter: Payer: Self-pay | Admitting: *Deleted

## 2021-08-29 ENCOUNTER — Telehealth: Payer: Self-pay | Admitting: Diagnostic Neuroimaging

## 2021-08-29 VITALS — BP 115/69 | HR 72 | Ht 67.0 in | Wt 179.6 lb

## 2021-08-29 DIAGNOSIS — G35 Multiple sclerosis: Secondary | ICD-10-CM
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
## 2021-08-29 NOTE — Addendum Note (Signed)
Addended by: Joycelyn Schmid R on: 08/29/2021 12:07 PM ? ? Modules accepted: Orders ? ?

## 2021-08-29 NOTE — Telephone Encounter (Signed)
Sent to [facility] in [place] ph # [phone] ?

## 2021-08-30 ENCOUNTER — Other Ambulatory Visit: Payer: Self-pay | Admitting: *Deleted

## 2021-08-30 DIAGNOSIS — G35 Multiple sclerosis: Secondary | ICD-10-CM

## 2021-08-30 LAB — CBC WITH DIFFERENTIAL/PLATELET
Basophils Absolute: 0.1 10*3/uL (ref 0.0–0.2)
Basos: 1 %
EOS (ABSOLUTE): 0.3 10*3/uL (ref 0.0–0.4)
Eos: 5 %
Hematocrit: 41 % (ref 34.0–46.6)
Hemoglobin: 14.7 g/dL (ref 11.1–15.9)
Immature Grans (Abs): 0 10*3/uL (ref 0.0–0.1)
Immature Granulocytes: 0 %
Lymphocytes Absolute: 1.9 10*3/uL (ref 0.7–3.1)
Lymphs: 28 %
MCH: 35.5 pg — ABNORMAL HIGH (ref 26.6–33.0)
MCHC: 35.9 g/dL — ABNORMAL HIGH (ref 31.5–35.7)
MCV: 99 fL — ABNORMAL HIGH (ref 79–97)
Monocytes Absolute: 1 10*3/uL — ABNORMAL HIGH (ref 0.1–0.9)
Monocytes: 16 %
Neutrophils Absolute: 3.3 10*3/uL (ref 1.4–7.0)
Neutrophils: 50 %
Platelets: 200 10*3/uL (ref 150–450)
RBC: 4.14 x10E6/uL (ref 3.77–5.28)
RDW: 11.5 % — ABNORMAL LOW (ref 11.7–15.4)
WBC: 6.6 10*3/uL (ref 3.4–10.8)

## 2021-08-30 LAB — COMPREHENSIVE METABOLIC PANEL
ALT: 19 IU/L (ref 0–32)
AST: 24 IU/L (ref 0–40)
Albumin/Globulin Ratio: 1.9 (ref 1.2–2.2)
Albumin: 4.6 g/dL (ref 3.8–4.9)
Alkaline Phosphatase: 83 IU/L (ref 44–121)
BUN/Creatinine Ratio: 16 (ref 9–23)
BUN: 15 mg/dL (ref 6–24)
Bilirubin Total: 0.3 mg/dL (ref 0.0–1.2)
CO2: 24 mmol/L (ref 20–29)
Calcium: 9.7 mg/dL (ref 8.7–10.2)
Chloride: 102 mmol/L (ref 96–106)
Creatinine, Ser: 0.96 mg/dL (ref 0.57–1.00)
Globulin, Total: 2.4 g/dL (ref 1.5–4.5)
Glucose: 93 mg/dL (ref 70–99)
Potassium: 3.6 mmol/L (ref 3.5–5.2)
Sodium: 143 mmol/L (ref 134–144)
Total Protein: 7 g/dL (ref 6.0–8.5)
eGFR: 71 mL/min/{1.73_m2} (ref >59)

## 2021-08-31 NOTE — Telephone Encounter (Signed)
Faxed orders for immunoglobulin to [facility] locations in [place], [address] and [facility].  ?

## 2021-09-06 ENCOUNTER — Telehealth: Payer: Self-pay | Admitting: *Deleted

## 2021-09-06 ENCOUNTER — Encounter (HOSPITAL_COMMUNITY): Payer: Medicare Other

## 2021-09-06 LAB — IMMUNOGLOBULINS A/E/G/M, SERUM
IgA/Immunoglobulin A, Serum: 98 mg/dL (ref 87–352)
IgE (Immunoglobulin E), Serum: 11 IU/mL (ref 6–495)
IgG (Immunoglobin G), Serum: 1026 mg/dL (ref 586–1602)
IgM (Immunoglobulin M), Srm: 99 mg/dL (ref 26–217)

## 2021-09-06 NOTE — Telephone Encounter (Signed)
Called [facility] where patient had Ig panel drawn, spoke with Pam who stated result will be ready on Friday.  ?

## 2021-09-06 NOTE — Telephone Encounter (Signed)
Called patient and LVM advising she cannot get Ocrevus infusion today without all Ig lab results. Three of 4 are resulted, and are in normal range. I called [facility], spoke with [place] and informed her that patient's full Ig panel is not resulted. I advised her I left patient VM and sent her a my chart advising she cannot get infusion today. Sherron Ales will try to call patient . She  verbalized understanding, appreciation. ? ?

## 2021-09-07 NOTE — Telephone Encounter (Signed)
All labs are back, Ig panel normal. I sent patient my chart advising she can reschedule her Ocrevus infusion.  ?

## 2021-09-20 ENCOUNTER — Telehealth: Payer: Self-pay | Admitting: *Deleted

## 2021-09-20 ENCOUNTER — Non-Acute Institutional Stay (HOSPITAL_COMMUNITY)
Admission: RE | Admit: 2021-09-20 | Discharge: 2021-09-20 | Disposition: A | Discharge status: Home / Self Care | Payer: Medicare Other | Source: Ambulatory Visit | Attending: Internal Medicine | Admitting: Internal Medicine

## 2021-09-20 DIAGNOSIS — G35 Multiple sclerosis: Secondary | ICD-10-CM | POA: Diagnosis present

## 2021-09-20 MED ORDER — SODIUM CHLORIDE 0.9 % IV SOLN
INTRAVENOUS | Status: DC | PRN
Start: 2021-09-20 — End: 2021-09-21

## 2021-09-20 MED ORDER — ACETAMINOPHEN 325 MG PO TABS
650.0000 mg | ORAL_TABLET | Freq: Once | ORAL | Status: AC
  Administered 2021-09-20: 650 mg via ORAL
  Filled 2021-09-20: qty 2

## 2021-09-20 MED ORDER — METHYLPREDNISOLONE SODIUM SUCC 125 MG IJ SOLR
125.0000 mg | Freq: Once | INTRAMUSCULAR | Status: AC
  Administered 2021-09-20: 125 mg via INTRAVENOUS
  Filled 2021-09-20: qty 2

## 2021-09-20 MED ORDER — SODIUM CHLORIDE 0.9 % IV SOLN
600.0000 mg | Freq: Once | INTRAVENOUS | Status: AC
  Administered 2021-09-20: 600 mg via INTRAVENOUS
  Filled 2021-09-20: qty 20

## 2021-09-20 MED ORDER — FAMOTIDINE IN NACL 20-0.9 MG/50ML-% IV SOLN
20.0000 mg | Freq: Once | INTRAVENOUS | Status: AC
  Administered 2021-09-20: 20 mg via INTRAVENOUS
  Filled 2021-09-20: qty 50

## 2021-09-20 MED ORDER — DIPHENHYDRAMINE HCL 50 MG/ML IJ SOLN
50.0000 mg | Freq: Once | INTRAMUSCULAR | Status: AC
  Administered 2021-09-20: 50 mg via INTRAVENOUS
  Filled 2021-09-20: qty 1

## 2021-09-20 NOTE — Progress Notes (Addendum)
[facility] NOTE: ? ?Diagnosis: Multiple Sclerosis ? ?Provider: Andrey Spearman MD ? ?Procedure: Ocrevus 600mg  ? ? ?Patient received IV Ocrevus.  Pre infusion medications -  Tylenol PO, Pepcid IV, Benadryl IV, Solu-medrol  IV were given. Medication was titrated per protocol. Provider's RN Stanton Kidney contacted about giving PRN dose of benadryl if pt develops itching during infusion, pt states she has a history of requiring additional medications;  however pt did not require intervention and had no complaints throughout infusion. Tolerated well, vitals stable, patient declined to wait for the 60 minutes post infusion observation. Pt declined AVS. Pt to RTC in 6 months, instructed pt to schedule next infusion at front desk prior to leaving, verbalized understanding. Patient alert, oriented and ambulatory at the time of discharge  ?

## 2021-09-20 NOTE — Telephone Encounter (Signed)
Received staff message from Clydene Fake RN/ [facility]:  ? ? I have this pt here getting Ocrevus today. She has said previously she required more benadryl during her infusion for itching in throat. She tolerated the infusion fine last time I gave it, and I gave her the 50mg  IV bendaryl before we started, but just in case she needs it can I order an additional 25 IV Benadryl PRN?  ?Per VO of Dr : only if needed for itching. Marjory Lies RN verbalized understanding, appreciation. ? ?

## 2021-11-30 ENCOUNTER — Encounter (HOSPITAL_COMMUNITY): Payer: Self-pay | Admitting: Emergency Medicine

## 2021-11-30 ENCOUNTER — Emergency Department (HOSPITAL_COMMUNITY): Payer: Medicare Other

## 2021-11-30 ENCOUNTER — Observation Stay (HOSPITAL_COMMUNITY)
Admission: EM | Admit: 2021-11-30 | Discharge: 2021-12-01 | Disposition: A | Discharge status: Home / Self Care | Payer: Medicare Other | Attending: Internal Medicine | Admitting: Internal Medicine

## 2021-11-30 ENCOUNTER — Other Ambulatory Visit: Payer: Self-pay

## 2021-11-30 DIAGNOSIS — R011 Cardiac murmur, unspecified: Secondary | ICD-10-CM | POA: Diagnosis not present

## 2021-11-30 DIAGNOSIS — G35 Multiple sclerosis: Secondary | ICD-10-CM | POA: Insufficient documentation

## 2021-11-30 DIAGNOSIS — I10 Essential (primary) hypertension: Secondary | ICD-10-CM | POA: Insufficient documentation

## 2021-11-30 DIAGNOSIS — E860 Dehydration: Secondary | ICD-10-CM | POA: Diagnosis not present

## 2021-11-30 DIAGNOSIS — J189 Pneumonia, unspecified organism: Secondary | ICD-10-CM | POA: Insufficient documentation

## 2021-11-30 DIAGNOSIS — E039 Hypothyroidism, unspecified: Secondary | ICD-10-CM | POA: Diagnosis not present

## 2021-11-30 DIAGNOSIS — Z79899 Other long term (current) drug therapy: Secondary | ICD-10-CM | POA: Diagnosis not present

## 2021-11-30 DIAGNOSIS — G894 Chronic pain syndrome: Secondary | ICD-10-CM

## 2021-11-30 DIAGNOSIS — Y92096 Garden or yard of other non-institutional residence as the place of occurrence of the external cause: Secondary | ICD-10-CM | POA: Diagnosis not present

## 2021-11-30 DIAGNOSIS — W091XXA Fall from playground swing, initial encounter: Secondary | ICD-10-CM | POA: Diagnosis not present

## 2021-11-30 DIAGNOSIS — R319 Hematuria, unspecified: Secondary | ICD-10-CM

## 2021-11-30 DIAGNOSIS — E876 Hypokalemia: Secondary | ICD-10-CM | POA: Diagnosis not present

## 2021-11-30 DIAGNOSIS — G8929 Other chronic pain: Secondary | ICD-10-CM | POA: Insufficient documentation

## 2021-11-30 DIAGNOSIS — S299XXA Unspecified injury of thorax, initial encounter: Secondary | ICD-10-CM | POA: Diagnosis present

## 2021-11-30 DIAGNOSIS — N179 Acute kidney failure, unspecified: Secondary | ICD-10-CM | POA: Diagnosis present

## 2021-11-30 DIAGNOSIS — N390 Urinary tract infection, site not specified: Secondary | ICD-10-CM | POA: Diagnosis not present

## 2021-11-30 DIAGNOSIS — Z91040 Latex allergy status: Secondary | ICD-10-CM | POA: Diagnosis not present

## 2021-11-30 DIAGNOSIS — J45909 Unspecified asthma, uncomplicated: Secondary | ICD-10-CM | POA: Diagnosis not present

## 2021-11-30 DIAGNOSIS — Z87891 Personal history of nicotine dependence: Secondary | ICD-10-CM | POA: Insufficient documentation

## 2021-11-30 DIAGNOSIS — R109 Unspecified abdominal pain: Secondary | ICD-10-CM | POA: Insufficient documentation

## 2021-11-30 DIAGNOSIS — Z20822 Contact with and (suspected) exposure to covid-19: Secondary | ICD-10-CM | POA: Insufficient documentation

## 2021-11-30 LAB — RESP PANEL BY RT-PCR (FLU A&B, COVID) ARPGX2
Influenza A by PCR: NEGATIVE
Influenza B by PCR: NEGATIVE
SARS Coronavirus 2 by RT PCR: NEGATIVE

## 2021-11-30 LAB — CBC WITH DIFFERENTIAL/PLATELET
Abs Immature Granulocytes: 0.08 10*3/uL — ABNORMAL HIGH (ref 0.00–0.07)
Basophils Absolute: 0.1 10*3/uL (ref 0.0–0.1)
Basophils Relative: 1 %
Eosinophils Absolute: 0.2 10*3/uL (ref 0.0–0.5)
Eosinophils Relative: 1 %
HCT: 46.4 % — ABNORMAL HIGH (ref 36.0–46.0)
Hemoglobin: 16.2 g/dL — ABNORMAL HIGH (ref 12.0–15.0)
Immature Granulocytes: 1 %
Lymphocytes Relative: 9 %
Lymphs Abs: 1.5 10*3/uL (ref 0.7–4.0)
MCH: 35.4 pg — ABNORMAL HIGH (ref 26.0–34.0)
MCHC: 34.9 g/dL (ref 30.0–36.0)
MCV: 101.5 fL — ABNORMAL HIGH (ref 80.0–100.0)
Monocytes Absolute: 1.7 10*3/uL — ABNORMAL HIGH (ref 0.1–1.0)
Monocytes Relative: 10 %
Neutro Abs: 13.3 10*3/uL — ABNORMAL HIGH (ref 1.7–7.7)
Neutrophils Relative %: 78 %
Platelets: 208 10*3/uL (ref 150–400)
RBC: 4.57 MIL/uL (ref 3.87–5.11)
RDW: 12 % (ref 11.5–15.5)
WBC: 16.9 10*3/uL — ABNORMAL HIGH (ref 4.0–10.5)
nRBC: 0 % (ref 0.0–0.2)

## 2021-11-30 LAB — COMPREHENSIVE METABOLIC PANEL
ALT: 20 U/L (ref 0–44)
AST: 20 U/L (ref 15–41)
Albumin: 4.4 g/dL (ref 3.5–5.0)
Alkaline Phosphatase: 91 U/L (ref 38–126)
Anion gap: 13 (ref 5–15)
BUN: 17 mg/dL (ref 6–20)
CO2: 26 mmol/L (ref 22–32)
Calcium: 10 mg/dL (ref 8.9–10.3)
Chloride: 101 mmol/L (ref 98–111)
Creatinine, Ser: 1.13 mg/dL — ABNORMAL HIGH (ref 0.44–1.00)
GFR, Estimated: 59 mL/min — ABNORMAL LOW (ref >60)
Glucose, Bld: 108 mg/dL — ABNORMAL HIGH (ref 70–99)
Potassium: 3.2 mmol/L — ABNORMAL LOW (ref 3.5–5.1)
Sodium: 140 mmol/L (ref 135–145)
Total Bilirubin: 1 mg/dL (ref 0.3–1.2)
Total Protein: 8 g/dL (ref 6.5–8.1)

## 2021-11-30 LAB — URINALYSIS, ROUTINE W REFLEX MICROSCOPIC
Bilirubin Urine: NEGATIVE
Glucose, UA: NEGATIVE mg/dL
Ketones, ur: NEGATIVE mg/dL
Nitrite: NEGATIVE
Protein, ur: 30 mg/dL — AB
Specific Gravity, Urine: 1.021 (ref 1.005–1.030)
WBC, UA: >50 WBC/hpf — ABNORMAL HIGH (ref 0–5)
pH: 5 (ref 5.0–8.0)

## 2021-11-30 LAB — HEPATIC FUNCTION PANEL
ALT: 20 U/L (ref 0–44)
AST: 20 U/L (ref 15–41)
Albumin: 4.2 g/dL (ref 3.5–5.0)
Alkaline Phosphatase: 89 U/L (ref 38–126)
Bilirubin, Direct: 0.1 mg/dL (ref 0.0–0.2)
Indirect Bilirubin: 0.6 mg/dL (ref 0.3–0.9)
Total Bilirubin: 0.7 mg/dL (ref 0.3–1.2)
Total Protein: 8 g/dL (ref 6.5–8.1)

## 2021-11-30 LAB — RAPID URINE DRUG SCREEN, HOSP PERFORMED
Amphetamines: POSITIVE — AB
Barbiturates: NOT DETECTED
Benzodiazepines: NOT DETECTED
Cocaine: NOT DETECTED
Opiates: POSITIVE — AB
Tetrahydrocannabinol: POSITIVE — AB

## 2021-11-30 LAB — PROTIME-INR
INR: 1 (ref 0.8–1.2)
Prothrombin Time: 13.1 seconds (ref 11.4–15.2)

## 2021-11-30 LAB — LIPASE, BLOOD: Lipase: 31 U/L (ref 11–51)

## 2021-11-30 LAB — AMMONIA: Ammonia: 41 umol/L — ABNORMAL HIGH (ref 9–35)

## 2021-11-30 LAB — CK: Total CK: 77 U/L (ref 38–234)

## 2021-11-30 LAB — PROCALCITONIN: Procalcitonin: <0.1 ng/mL

## 2021-11-30 LAB — LACTIC ACID, PLASMA
Lactic Acid, Venous: 1 mmol/L (ref 0.5–1.9)
Lactic Acid, Venous: 1.1 mmol/L (ref 0.5–1.9)

## 2021-11-30 LAB — MAGNESIUM: Magnesium: 2.3 mg/dL (ref 1.7–2.4)

## 2021-11-30 LAB — PHOSPHORUS: Phosphorus: 3.8 mg/dL (ref 2.5–4.6)

## 2021-11-30 LAB — I-STAT BETA HCG BLOOD, ED (MC, WL, AP ONLY): I-stat hCG, quantitative: <5 m[IU]/mL (ref <5)

## 2021-11-30 MED ORDER — SODIUM CHLORIDE 0.9 % IV SOLN: Freq: Once | INTRAVENOUS | Status: AC

## 2021-11-30 MED ORDER — HYDROCODONE-ACETAMINOPHEN 5-325 MG PO TABS
2.0000 | ORAL_TABLET | Freq: Once | ORAL | Status: AC
  Administered 2021-11-30: 2 via ORAL
  Filled 2021-11-30: qty 2

## 2021-11-30 MED ORDER — IOHEXOL 350 MG/ML SOLN
100.0000 mL | Freq: Once | INTRAVENOUS | Status: AC | PRN
  Administered 2021-11-30: 100 mL via INTRAVENOUS

## 2021-11-30 MED ORDER — MORPHINE SULFATE (PF) 4 MG/ML IV SOLN
4.0000 mg | Freq: Once | INTRAVENOUS | Status: AC
  Administered 2021-11-30: 4 mg via INTRAVENOUS
  Filled 2021-11-30: qty 1

## 2021-11-30 MED ORDER — POTASSIUM CHLORIDE CRYS ER 20 MEQ PO TBCR
40.0000 meq | EXTENDED_RELEASE_TABLET | Freq: Once | ORAL | Status: AC
  Administered 2021-11-30: 40 meq via ORAL
  Filled 2021-11-30: qty 2

## 2021-11-30 MED ORDER — CEFTRIAXONE SODIUM 2 G IJ SOLR
2.0000 g | Freq: Once | INTRAMUSCULAR | Status: AC
  Administered 2021-11-30: 2 g via INTRAVENOUS
  Filled 2021-11-30: qty 20

## 2021-11-30 MED ORDER — AZITHROMYCIN 500 MG IV SOLR
500.0000 mg | Freq: Once | INTRAVENOUS | Status: AC
  Administered 2021-11-30: 500 mg via INTRAVENOUS
  Filled 2021-11-30: qty 5

## 2021-11-30 MED ORDER — HYDROMORPHONE HCL 1 MG/ML IJ SOLN
0.5000 mg | INTRAMUSCULAR | Status: DC | PRN
  Administered 2021-11-30 – 2021-12-01 (×3): 1 mg via INTRAVENOUS
  Filled 2021-11-30 (×3): qty 1

## 2021-11-30 NOTE — Assessment & Plan Note (Signed)
-   treat with Rocephin         await results of urine culture and adjust antibiotic coverage as needed  

## 2021-11-30 NOTE — Assessment & Plan Note (Signed)
Obtain echo °

## 2021-11-30 NOTE — Assessment & Plan Note (Signed)
-   will replace and repeat in AM,  check magnesium level and replace as needed ° °

## 2021-11-30 NOTE — ED Provider Notes (Signed)
[facility] EMERGENCY DEPARTMENT Provider Note   CSN: [id number] Arrival date & time: 11/30/21  1324     History  Chief Complaint  Patient presents with   Rib Injury    Clementine Soulliere is a 51 y.o. female.  51 year old female with prior medical history as detailed below presents for evaluation.  Patient reports that she fell from a swing on Saturday.  She landed hard on her right ribs.  Tree roots were underneath her on the ground.  She complains of significant pain to the right lateral chest wall after this fall.  She reports significant pain with deep breathing.  She reports interim development of fever to 101 with significant cough with production of sputum.  She reports that her regular pain medication is not controlling her pain.  She denies head injury or loss conscious.  She reports that even with her chronic pain meds and rest her pain is worsened.  The history is provided by the patient and medical records.       Home Medications Prior to Admission medications   Medication Sig Start Date End Date Taking? Authorizing Provider  albuterol (VENTOLIN HFA) 108 (90 Base) MCG/ACT inhaler Inhale 1-2 puffs into the lungs every 6 (six) hours as needed for wheezing or shortness of breath. 11/22/19   Lurene Shadow, PA-C  ALPRAZolam (XANAX) 0.5 MG tablet TAKE 1 TABLET BY MOUTH THREE TIMES A DAY AS NEEDED FOR ANXIETY 11/21/17   [provider]  amphetamine-dextroamphetamine (ADDERALL) 30 MG tablet Take 30 mg by mouth 2 (two) times daily.  09/15/12   [provider]  baclofen (LIORESAL) 10 MG tablet Take 10 mg by mouth 3 (three) times daily.    [provider]  Biotin 5000 MCG CAPS Take 1 capsule by mouth daily.    [provider]  busPIRone (BUSPAR) 10 MG tablet 10 mg 3 (three) times daily. 07/13/16   [provider]  celecoxib (CELEBREX) 100 MG capsule 100 mg 2 (two) times daily. 07/16/16   [provider]  cetirizine  (ZYRTEC) 10 MG tablet Take 10 mg by mouth daily.    [provider]  diclofenac sodium (VOLTAREN) 1 % GEL Apply topically. 05/08/18   [provider]  diphenoxylate-atropine (LOMOTIL) 2.5-0.025 MG tablet 2.5-0.025,mg as needed 03/15/15   [provider]  DULoxetine (CYMBALTA) 30 MG capsule Take 30 mg by mouth at bedtime. 05/17/16   [provider]  DULoxetine (CYMBALTA) 60 MG capsule Take 60 mg by mouth daily.  09/02/12   [provider]  furosemide (LASIX) 40 MG tablet Take 40 mg by mouth daily.  04/24/15   [provider]  glycopyrrolate (ROBINUL) 2 MG tablet Take 2 mg by mouth daily. Patient not taking: Reported on 02/28/2021 04/15/17   [provider]  hydrochlorothiazide (HYDRODIURIL) 25 MG tablet Take 25 mg by mouth daily. 06/01/20   [provider]  HYDROmorphone (DILAUDID) 2 MG tablet Take 2 mg by mouth every 6 (six) hours as needed. for pain 12/16/17   [provider]  hydrOXYzine (ATARAX/VISTARIL) 10 MG tablet TAKE ONE TABLET (10 MG DOSE) BY MOUTH 3 (THREE) TIMES A DAY AS NEEDED FOR ITCHING FOR UP TO 10 DAYS. 11/05/17   [provider]  Ketorolac Tromethamine 15.75 MG/SPRAY SOLN Place 1 spray into the nose every 6 (six) hours as needed.  05/13/17   [provider]  levETIRAcetam (KEPPRA) 750 MG tablet 1,500 mg 2 (two) times daily.  09/08/12   [provider]  liothyronine (CYTOMEL) 5 MCG tablet Take 5 mcg by mouth daily.  05/22/16   [provider]  lurasidone (LATUDA) 40 MG TABS tablet Take 40 mg by mouth at bedtime.    [provider]  magnesium oxide (MAG-OX) 400 MG tablet Take 400 mg by mouth daily.    [provider]  Multiple Vitamins-Minerals (MULTIVITAMIN WITH MINERALS) tablet Take 1 tablet by mouth daily.    [provider]  ocrelizumab (OCREVUS) 300 MG/10ML injection Inject 300 mg into the vein. Last infusion 02/2017    [provider]   olopatadine (PATANOL) 0.1 % ophthalmic solution PLACE ONE DROP INTO BOTH EYES 2 (TWO) TIMES DAILY. 06/20/17   [provider]  ondansetron (ZOFRAN) 8 MG tablet Take 8 mg by mouth as needed. 07/25/17   [provider]  oxybutynin (DITROPAN-XL) 5 MG 24 hr tablet TAKE 1 TABLET BY MOUTH EVERYDAY AT BEDTIME Patient not taking: Reported on 08/29/2021 07/14/18   Penumalli, Glenford Bayley, MD  oxyCODONE-acetaminophen (PERCOCET) 10-325 MG per tablet Take 1 tablet by mouth every 4 (four) hours as needed for pain. 10/28/12   Cindee Salt, MD  pantoprazole (PROTONIX) 40 MG tablet Take 40 mg by mouth 2 (two) times daily. 04/04/18   [provider]  pregabalin (LYRICA) 150 MG capsule Take 150 mg by mouth 3 (three) times daily. 07/23/16 09/05/17  [provider]  promethazine (PHENERGAN) 25 MG tablet Take 25 mg by mouth every 4 (four) hours as needed.  09/01/12   [provider]  SYNTHROID 112 MCG tablet Take 112 mcg by mouth daily. 02/09/21   [provider]  tizanidine (ZANAFLEX) 6 MG capsule Take 6 mg by mouth 3 (three) times daily.    [provider]  valACYclovir (VALTREX) 1000 MG tablet TAKE 2 TABS AT ONSET AND 2 TABS 12 HOURS LATER X1DAY FOR EACH FLARE 06/09/17   [provider]  zolpidem (AMBIEN) 10 MG tablet Take 10 mg by mouth at bedtime as needed. 09/01/19   [provider]      Allergies    Levaquin [levofloxacin in d5w], Levofloxacin, Bactrim [sulfamethoxazole-trimethoprim], Doxycycline, Other, and Latex    Review of Systems   Review of Systems  All other systems reviewed and are negative.   Physical Exam Updated Vital Signs BP 109/80   Pulse 73   Temp 99.4 F (37.4 C) (Oral)   Resp (!) 21   LMP 12/30/2012   SpO2 90%  Physical Exam Vitals and nursing note reviewed.  Constitutional:      General: She is not in acute distress.    Appearance: Normal appearance. She is well-developed.  HENT:     Head: Normocephalic and  atraumatic.  Eyes:     Conjunctiva/sclera: Conjunctivae normal.     Pupils: Pupils are equal, round, and reactive to light.  Cardiovascular:     Rate and Rhythm: Normal rate and regular rhythm.     Heart sounds: Normal heart sounds.     Comments: Significant tenderness overlying the right lateral chest wall.  No ecchymosis or crepitus appreciated.  No rash appreciated. Pulmonary:     Effort: Pulmonary effort is normal. No respiratory distress.     Breath sounds: Normal breath sounds.  Abdominal:     General: There is no distension.     Palpations: Abdomen is soft.     Tenderness: There is no abdominal tenderness.  Musculoskeletal:        General: No deformity. Normal range of motion.  Cervical back: Normal range of motion and neck supple.  Skin:    General: Skin is warm and dry.  Neurological:     General: No focal deficit present.     Mental Status: She is alert and oriented to person, place, and time.     ED Results / Procedures / Treatments   Labs (all labs ordered are listed, but only abnormal results are displayed) Labs Reviewed  CBC WITH DIFFERENTIAL/PLATELET - Abnormal; Notable for the following components:      Result Value   WBC 16.9 (*)    Hemoglobin 16.2 (*)    HCT 46.4 (*)    MCV 101.5 (*)    MCH 35.4 (*)    Neutro Abs 13.3 (*)    Monocytes Absolute 1.7 (*)    Abs Immature Granulocytes 0.08 (*)    All other components within normal limits  COMPREHENSIVE METABOLIC PANEL - Abnormal; Notable for the following components:   Potassium 3.2 (*)    Glucose, Bld 108 (*)    Creatinine, Ser 1.13 (*)    GFR, Estimated 59 (*)    All other components within normal limits  CULTURE, BLOOD (ROUTINE X 2)  CULTURE, BLOOD (ROUTINE X 2)  RESP PANEL BY RT-PCR (FLU A&B, COVID) ARPGX2  LIPASE, BLOOD  LACTIC ACID, PLASMA  LACTIC ACID, PLASMA  URINALYSIS, ROUTINE W REFLEX MICROSCOPIC  RAPID URINE DRUG SCREEN, HOSP PERFORMED  I-STAT BETA HCG BLOOD, ED ([facility], [facility], [facility] ONLY)     EKG None  Radiology DG Chest 2 View  Result Date: 11/30/2021 CLINICAL DATA:  Chest pain. EXAM: CHEST - 2 VIEW COMPARISON:  09/09/2017 FINDINGS: Normal cardiomediastinal contours. The lungs appear hyperinflated. No pleural effusion or edema identified. No airspace opacities. Scarring is noted within both lung bases. IMPRESSION: 1. No acute cardiopulmonary abnormalities. 2. Bibasilar scarring. Electronically Signed   By: Signa Kell [name]   On: 11/30/2021 14:52    Procedures Procedures    Medications Ordered in ED Medications  HYDROcodone-acetaminophen (NORCO/VICODIN) 5-325 MG per tablet 2 tablet (2 tablets Oral Given 11/30/21 1429)  morphine (PF) 4 MG/ML injection 4 mg (4 mg Intravenous Given 11/30/21 1713)    ED Course/ Medical Decision Making/ A&P                           Medical Decision Making Amount and/or Complexity of Data Reviewed Labs: ordered. Radiology: ordered.  Risk Prescription drug management. Decision regarding hospitalization.    Medical Screen Complete  This patient presented to the ED with complaint of chest pain, cough, fever.  This complaint involves an extensive number of treatment options. The initial differential diagnosis includes, but is not limited to, trauma related to fall, pneumonia,  This presentation is: Acute, Self-Limited, Previously Undiagnosed, Uncertain Prognosis, Complicated, Systemic Symptoms, and Threat to Life/Bodily Function  Patient is presenting with complaint of fall with right-sided rib injury.  Patient also complains of worsening cough with productive sputum and fevers.  Exam is not strongly suggestive of significant traumatic injury.  CT imaging reveals evidence of likely multifocal pneumonia.    Patient would benefit from admission.  Hospitalist service made aware of case and will evaluate for same.  Additional history obtained:  External records from outside sources obtained and reviewed including prior ED  visits and prior Inpatient records.    Lab Tests:  I ordered and personally interpreted labs.  The pertinent results include: BC, CMP, tox screen, UA, blood cultures, COVID, flu  Imaging Studies ordered:  I ordered imaging studies including chest x-ray, CTA chest I independently visualized and interpreted obtained imaging which showed multifocal pneumonia I agree with the radiologist interpretation.   Cardiac Monitoring:  The patient was maintained on a cardiac monitor.  I personally viewed and interpreted the cardiac monitor which showed an underlying rhythm of: NSR   Medicines ordered:  I ordered medication including Rocephin, azithromycin, morphine for infection, pain Reevaluation of the patient after these medicines showed that the patient: improved   Problem List / ED Course:  Shortness of breath, chest pain, pneumonia, hypoxia   Reevaluation:  After the interventions noted above, I reevaluated the patient and found that they have: improved   Disposition:  After consideration of the diagnostic results and the patients response to treatment, I feel that the patent would benefit from admission.   CRITICAL CARE Performed by: Wynetta Fines   Total critical care time: 30 minutes  Critical care time was exclusive of separately billable procedures and treating other patients.  Critical care was necessary to treat or prevent imminent or life-threatening deterioration.  Critical care was time spent personally by me on the following activities: development of treatment plan with patient and/or surrogate as well as nursing, discussions with consultants, evaluation of patient's response to treatment, examination of patient, obtaining history from patient or surrogate, ordering and performing treatments and interventions, ordering and review of laboratory studies, ordering and review of radiographic studies, pulse oximetry and re-evaluation of patient's  condition.          Final Clinical Impression(s) / ED Diagnoses Final diagnoses:  Community acquired pneumonia, unspecified laterality    Rx / DC Orders ED Discharge Orders     None         Wynetta Fines, MD 11/30/21 2134

## 2021-11-30 NOTE — Assessment & Plan Note (Addendum)
Likely secondary to dehydration will rehydrate and follow fluid status Obtain urine electrolytes and orthostatics prior to discharge

## 2021-11-30 NOTE — Assessment & Plan Note (Signed)
Check TSH and continue Synthroid ?

## 2021-11-30 NOTE — ED Provider Triage Note (Signed)
Emergency Medicine Provider Triage Evaluation Note  [name] , a 51 y.o. female  was evaluated in triage.  Pt complains of fall from swing that occurred Saturday.  Patient hit tree roots during the fall.  Patient states her 80 pound dog jumped on her chest wall immediately after.  Patient has been refraining from taking deep breaths secondary to pain.  Now reports productive cough.  Also reports abdominal distention and pain.  Appears uncomfortable during exam.  Review of Systems  Positive: As above Negative: Of as above  Physical Exam  BP (!) 124/93 (BP Location: Right Arm)   Pulse 97   Temp 99.4 F (37.4 C) (Oral)   Resp 17   LMP 12/30/2012   SpO2 91%  Gen:   Awake, no distress   Resp:  Normal effort  MSK:   Moves extremities without difficulty  Other:    Medical Decision Making  Medically screening exam initiated at 2:00 PM.  Appropriate orders placed.  [name] was informed that the remainder of the evaluation will be completed by another provider, this initial triage assessment does not replace that evaluation, and the importance of remaining in the ED until their evaluation is complete.     Marita Kansas, PA-C 11/30/21 1401

## 2021-11-30 NOTE — Subjective & Objective (Signed)
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 Denies injuring her head

## 2021-11-30 NOTE — Assessment & Plan Note (Signed)
- -  Patient presenting with productive cough, fever and infiltrate in   lower lobe on chest CT    will admit for treatment of CAP will start on appropriate antibiotic coverage. - Rocephin/azithromycin   Obtain:  sputum cultures,                 Obtain respiratory panel                  influenza serologies pending                  COVID  Pending but unlikely                  blood cultures and sputum cultures ordered                   strep pneumo UA antigen,                    check for Legionella antigen.                Provide oxygen as needed.

## 2021-11-30 NOTE — ED Notes (Signed)
Patient transported to CT 

## 2021-11-30 NOTE — Assessment & Plan Note (Signed)
Patient followed up by neurology current appears to be stable

## 2021-11-30 NOTE — Assessment & Plan Note (Signed)
Chronic stable continue home medications ?

## 2021-11-30 NOTE — H&P (Signed)
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 the nose every 6 (six) hours as needed.  05/13/17   [provider]  levETIRAcetam (KEPPRA) 750 MG tablet 1,500 mg 2 (two) times daily.  09/08/12   [provider]  liothyronine (CYTOMEL) 5 MCG tablet Take 5 mcg by mouth daily.  05/22/16   [provider]  lurasidone (LATUDA) 40 MG TABS tablet Take 40 mg by mouth at  bedtime.    [provider]  magnesium oxide (MAG-OX) 400 MG tablet Take 400 mg by mouth daily.    [provider]  Multiple Vitamins-Minerals (MULTIVITAMIN WITH MINERALS) tablet Take 1 tablet by mouth daily.    [provider]  ocrelizumab (OCREVUS) 300 MG/10ML injection Inject 300 mg into the vein. Last infusion 02/2017    [provider]  olopatadine (PATANOL) 0.1 % ophthalmic solution PLACE ONE DROP INTO BOTH EYES 2 (TWO) TIMES DAILY. 06/20/17   [provider]  ondansetron (ZOFRAN) 8 MG tablet Take 8 mg by mouth as needed. 07/25/17   [provider]  oxybutynin (DITROPAN-XL) 5 MG 24 hr tablet TAKE 1 TABLET BY MOUTH EVERYDAY AT BEDTIME Patient not taking: Reported on 08/29/2021 07/14/18   Penumalli, Earlean Polka, MD  oxyCODONE-acetaminophen (PERCOCET) 10-325 MG per tablet Take 1 tablet by mouth every 4 (four) hours as needed for pain. 10/28/12   Daryll Brod, MD  pantoprazole (PROTONIX) 40 MG tablet Take 40 mg by mouth 2 (two) times daily. 04/04/18   [provider]  pregabalin (LYRICA) 150 MG capsule Take 150 mg by mouth 3 (three) times daily. 07/23/16 09/05/17  [provider]  promethazine (PHENERGAN) 25 MG tablet Take 25 mg by mouth every 4 (four) hours as needed.  09/01/12   [provider]  SYNTHROID 112 MCG tablet Take 112 mcg by mouth daily. 02/09/21   [provider]  tizanidine (ZANAFLEX) 6 MG capsule Take 6 mg by mouth 3 (three) times daily.    [provider]  valACYclovir (VALTREX) 1000 MG tablet TAKE 2 TABS AT ONSET AND 2 TABS 12 HOURS LATER X1DAY FOR EACH FLARE 06/09/17   [provider]  zolpidem (AMBIEN) 10 MG tablet Take 10 mg by mouth at bedtime as needed. 09/01/19   [provider]  ___________________________________________________________________________________________________ Physical Exam:    11/30/2021    6:45 PM 11/30/2021    5:15 PM 11/30/2021    5:00 PM  Vitals  with BMI  Systolic 108 109 409  Diastolic 71 80 94  Pulse 71 73 78     1. General:  in No  Acute distress   Chronically ill   -appearing 2. Psychological: Alert and   Oriented 3. Head/ENT:   Dry Mucous Membranes                          Head Non traumatic, neck supple                      Poor Dentition 4. SKIN:  decreased Skin turgor,  Skin clean Dry and intact no rash 5. Heart: Regular rate and rhythm systolic Murmur, no Rub or gallop 6. Lungs:  , no wheezes or crackles   7. Abdomen: Soft,  non-tender, Non distended   obese  bowel sounds present 8. Lower extremities: no clubbing, cyanosis, no  edema 9. Neurologically Grossly intact, moving all 4 extremities equally  10. MSK: Normal range of motion    Chart has been reviewed  ______________________________________________________________________________________________  Assessment/Plan 51 y.o. female with medical history significant of asthma chronic back pain depression fibromyalgia hypothyroidism 3 spinal abscess multiple sclerosis neuropathy  Admitted for community-acquired pneumonia UTI and AKI  Present on Admission:  Chronic pain  Hypothyroidism  Multiple sclerosis (HCC)  CAP (community acquired pneumonia)  AKI (acute kidney injury) (HCC)  UTI (urinary tract infection)  Hypokalemia  Murmur, cardiac     Chronic pain Chronic stable continue home medications  Hypothyroidism Check TSH and continue Synthroid  Multiple sclerosis (HCC) Patient followed up by neurology current appears to be stable   CAP (community acquired pneumonia)  - -Patient presenting with productive cough, fever and infiltrate in   lower lobe on chest CT    will admit for treatment of CAP will start on appropriate antibiotic coverage. - Rocephin/azithromycin   Obtain:  sputum cultures,                 Obtain respiratory panel                  influenza serologies pending                  COVID  Pending but unlikely                   blood cultures and sputum cultures ordered                   strep pneumo UA antigen,                    check for Legionella antigen.                Provide oxygen as needed.     AKI (acute kidney injury) (HCC) Likely secondary to dehydration will rehydrate and follow fluid status Obtain urine electrolytes and orthostatics prior to discharge  UTI (urinary tract infection)  - treat with Rocephin        await results of urine culture and adjust antibiotic coverage as needed   Hypokalemia - will replace and repeat in AM,  check magnesium level and replace as needed   Murmur, cardiac Obtain echo    Other plan as per orders.  DVT prophylaxis:  SCD  Code Status:    Code Status: Prior FULL CODE    as per patient   I had personally discussed CODE STATUS with patient     Family Communication:   Family not at  Bedside    Disposition Plan:     likely will need placement for rehabilitation                            Following barriers for discharge:                            Electrolytes corrected                                                          Pain controlled with PO medications                               Afebrile, white count improving able to transition to PO antibiotics                         Would benefit from PT/OT eval prior to DC  Ordered                   Swallow eval - SLP ordered                                        Consults called: none   Admission status:  ED Disposition     ED Disposition  Admit   Condition  --   Pope: Lynd [[id number]]  Level of Care: Telemetry Medical [104]  May place patient in observation at [facility] or [place] if equivalent level of care is available:: No  Covid Evaluation: Symptomatic Person Under Investigation (PUI) or recent exposure (last 10 days) *Testing Required*  Diagnosis: CAP (community acquired pneumoniaUT:[id number]  Admitting Physician: Toy Baker  [[id number]]  Attending Physician: Toy Baker [[id number]]           Obs     Level of care     tele  For 12H      Malayshia All [place] 11/30/2021, 9:59 PM    [facility]     after 2 AM please page floor coverage PA If 7AM-7PM, please contact the day team taking care of the patient using [email or web address]   Patient was evaluated in the context of the global COVID-19 pandemic, which necessitated consideration that the patient might be at risk for infection with the SARS-CoV-2 virus that causes COVID-19. Institutional protocols and algorithms that pertain to the evaluation of patients at risk for COVID-19 are in a state of rapid change based on information released by regulatory bodies including the CDC and federal and state organizations. These policies and algorithms were followed during the patient's care.

## 2021-11-30 NOTE — ED Triage Notes (Signed)
Pt states she fell out of a backyard swing approx 4 ft on Saturday. The swing broke, and she landed on some tree roots. Pt hit her right sided ribs. Pt states her 80lb dog jumped onto her chest as well. She's in really bad pain in her right rib cage. States she has started running a fever and has been coughing up green phlegm.

## 2021-12-01 ENCOUNTER — Other Ambulatory Visit (HOSPITAL_COMMUNITY): Payer: Self-pay

## 2021-12-01 DIAGNOSIS — G894 Chronic pain syndrome: Secondary | ICD-10-CM | POA: Diagnosis not present

## 2021-12-01 DIAGNOSIS — E039 Hypothyroidism, unspecified: Secondary | ICD-10-CM | POA: Diagnosis not present

## 2021-12-01 DIAGNOSIS — S299XXA Unspecified injury of thorax, initial encounter: Secondary | ICD-10-CM | POA: Diagnosis not present

## 2021-12-01 LAB — COMPREHENSIVE METABOLIC PANEL
ALT: 19 U/L (ref 0–44)
AST: 19 U/L (ref 15–41)
Albumin: 3.6 g/dL (ref 3.5–5.0)
Alkaline Phosphatase: 80 U/L (ref 38–126)
Anion gap: 9 (ref 5–15)
BUN: 20 mg/dL (ref 6–20)
CO2: 30 mmol/L (ref 22–32)
Calcium: 9.1 mg/dL (ref 8.9–10.3)
Chloride: 100 mmol/L (ref 98–111)
Creatinine, Ser: 0.88 mg/dL (ref 0.44–1.00)
GFR, Estimated: >60 mL/min (ref >60)
Glucose, Bld: 121 mg/dL — ABNORMAL HIGH (ref 70–99)
Potassium: 3.2 mmol/L — ABNORMAL LOW (ref 3.5–5.1)
Sodium: 139 mmol/L (ref 135–145)
Total Bilirubin: 0.6 mg/dL (ref 0.3–1.2)
Total Protein: 7.3 g/dL (ref 6.5–8.1)

## 2021-12-01 LAB — PROCALCITONIN: Procalcitonin: <0.1 ng/mL

## 2021-12-01 LAB — CBC
HCT: 40.3 % (ref 36.0–46.0)
Hemoglobin: 14.1 g/dL (ref 12.0–15.0)
MCH: 35.9 pg — ABNORMAL HIGH (ref 26.0–34.0)
MCHC: 35 g/dL (ref 30.0–36.0)
MCV: 102.5 fL — ABNORMAL HIGH (ref 80.0–100.0)
Platelets: 189 10*3/uL (ref 150–400)
RBC: 3.93 MIL/uL (ref 3.87–5.11)
RDW: 11.9 % (ref 11.5–15.5)
WBC: 9.3 10*3/uL (ref 4.0–10.5)
nRBC: 0 % (ref 0.0–0.2)

## 2021-12-01 LAB — PREALBUMIN: Prealbumin: 25 mg/dL (ref 18–38)

## 2021-12-01 LAB — HIV ANTIBODY (ROUTINE TESTING W REFLEX): HIV Screen 4th Generation wRfx: NONREACTIVE

## 2021-12-01 MED ORDER — POTASSIUM CHLORIDE CRYS ER 20 MEQ PO TBCR
40.0000 meq | EXTENDED_RELEASE_TABLET | Freq: Once | ORAL | Status: AC
  Administered 2021-12-01: 40 meq via ORAL
  Filled 2021-12-01: qty 2

## 2021-12-01 MED ORDER — LEVOTHYROXINE SODIUM 112 MCG PO TABS
112.0000 ug | ORAL_TABLET | Freq: Every day | ORAL | Status: DC
  Administered 2021-12-01: 112 ug via ORAL
  Filled 2021-12-01: qty 1

## 2021-12-01 MED ORDER — OXYCODONE-ACETAMINOPHEN 10-325 MG PO TABS: 1.0000 | ORAL_TABLET | ORAL | Status: DC

## 2021-12-01 MED ORDER — SODIUM CHLORIDE 0.9 % IV SOLN: INTRAVENOUS | Status: DC

## 2021-12-01 MED ORDER — ACETAMINOPHEN 650 MG RE SUPP: 650.0000 mg | Freq: Four times a day (QID) | RECTAL | Status: DC | PRN

## 2021-12-01 MED ORDER — OXYCODONE HCL 5 MG PO TABS
10.0000 mg | ORAL_TABLET | ORAL | Status: DC | PRN
  Administered 2021-12-01: 10 mg via ORAL
  Filled 2021-12-01: qty 2

## 2021-12-01 MED ORDER — DULOXETINE HCL 30 MG PO CPEP
60.0000 mg | ORAL_CAPSULE | Freq: Every morning | ORAL | Status: DC
  Administered 2021-12-01: 60 mg via ORAL
  Filled 2021-12-01: qty 2

## 2021-12-01 MED ORDER — DICLOFENAC SODIUM 1 % EX GEL
2.0000 g | Freq: Four times a day (QID) | CUTANEOUS | Status: DC
  Filled 2021-12-01: qty 100

## 2021-12-01 MED ORDER — ALBUTEROL SULFATE (2.5 MG/3ML) 0.083% IN NEBU: 2.5000 mg | INHALATION_SOLUTION | RESPIRATORY_TRACT | Status: DC | PRN

## 2021-12-01 MED ORDER — BACLOFEN 10 MG PO TABS: 10.0000 mg | ORAL_TABLET | Freq: Three times a day (TID) | ORAL | Status: DC

## 2021-12-01 MED ORDER — PANTOPRAZOLE SODIUM 40 MG PO TBEC: 40.0000 mg | DELAYED_RELEASE_TABLET | Freq: Two times a day (BID) | ORAL | Status: DC

## 2021-12-01 MED ORDER — HYDROCODONE-ACETAMINOPHEN 5-325 MG PO TABS: 1.0000 | ORAL_TABLET | ORAL | Status: DC | PRN

## 2021-12-01 MED ORDER — LEVETIRACETAM 500 MG PO TABS
1500.0000 mg | ORAL_TABLET | Freq: Two times a day (BID) | ORAL | Status: DC
  Administered 2021-12-01: 1500 mg via ORAL
  Filled 2021-12-01: qty 3

## 2021-12-01 MED ORDER — GUAIFENESIN ER 600 MG PO TB12: 600.0000 mg | ORAL_TABLET | Freq: Two times a day (BID) | ORAL | Status: DC

## 2021-12-01 MED ORDER — ACETAMINOPHEN 325 MG PO TABS
650.0000 mg | ORAL_TABLET | Freq: Four times a day (QID) | ORAL | Status: DC | PRN
  Administered 2021-12-01: 650 mg via ORAL
  Filled 2021-12-01: qty 2

## 2021-12-01 MED ORDER — ALBUTEROL SULFATE HFA 108 (90 BASE) MCG/ACT IN AERS: 1.0000 | INHALATION_SPRAY | Freq: Four times a day (QID) | RESPIRATORY_TRACT | Status: DC | PRN

## 2021-12-01 MED ORDER — DICLOFENAC SODIUM 1 % EX GEL
2.0000 g | Freq: Four times a day (QID) | CUTANEOUS | 0 refills | Status: DC
  Filled 2021-12-01: qty 100, 30d supply, fill #0

## 2021-12-01 MED ORDER — AZITHROMYCIN 500 MG PO TABS
500.0000 mg | ORAL_TABLET | Freq: Every day | ORAL | 0 refills | Status: AC
  Filled 2021-12-01: qty 4, 4d supply, fill #0

## 2021-12-01 MED ORDER — LACTATED RINGERS IV SOLN: INTRAVENOUS | Status: DC

## 2021-12-01 MED ORDER — ATORVASTATIN CALCIUM 10 MG PO TABS: 10.0000 mg | ORAL_TABLET | Freq: Every day | ORAL | Status: DC

## 2021-12-01 MED ORDER — LACTULOSE 10 GM/15ML PO SOLN
20.0000 g | Freq: Two times a day (BID) | ORAL | Status: DC
Start: 2021-12-01 — End: 2021-12-01
  Filled 2021-12-01: qty 30

## 2021-12-01 MED ORDER — SODIUM CHLORIDE 0.9 % IV SOLN: 500.0000 mg | INTRAVENOUS | Status: DC

## 2021-12-01 MED ORDER — ALPRAZOLAM 0.25 MG PO TABS
0.2500 mg | ORAL_TABLET | Freq: Three times a day (TID) | ORAL | Status: DC | PRN
Start: 2021-12-01 — End: 2021-12-01

## 2021-12-01 MED ORDER — PREGABALIN 25 MG PO CAPS: 150.0000 mg | ORAL_CAPSULE | Freq: Three times a day (TID) | ORAL | Status: DC

## 2021-12-01 MED ORDER — SODIUM CHLORIDE 0.9 % IV SOLN: 2.0000 g | INTRAVENOUS | Status: DC

## 2021-12-01 MED ORDER — BUSPIRONE HCL 10 MG PO TABS: 10.0000 mg | ORAL_TABLET | Freq: Three times a day (TID) | ORAL | Status: DC

## 2021-12-01 NOTE — Discharge Instructions (Signed)
Use the given incentive spirometer every 30 minutes while you are awake.  Stay active and sit in the chair as much as possible.  Follow with Primary MD Primus Bravo, NP in 7 days   Get CBC, CMP, ammonia level, 2 view Chest X ray -  checked next visit within 1 week by Primary MD    Activity: As tolerated with Full fall precautions use walker/cane & assistance as needed  Disposition Home    Diet: Heart Healthy    Special Instructions: If you have smoked or chewed Tobacco  in the last 2 yrs please stop smoking, stop any regular Alcohol  and or any Recreational drug use.  On your next visit with your primary care physician please Get Medicines reviewed and adjusted.  Please request your Prim.MD to go over all Hospital Tests and Procedure/Radiological results at the follow up, please get all Hospital records sent to your Prim MD by signing hospital release before you go home.  If you experience worsening of your admission symptoms, develop shortness of breath, life threatening emergency, suicidal or homicidal thoughts you must seek medical attention immediately by calling 911 or calling your MD immediately  if symptoms less severe.  You Must read complete instructions/literature along with all the possible adverse reactions/side effects for all the Medicines you take and that have been prescribed to you. Take any new Medicines after you have completely understood and accpet all the possible adverse reactions/side effects.

## 2021-12-01 NOTE — ED Notes (Signed)
Patient refused IV fluids, states she is ready to go. States the doctor gave her the impression she would be going home.

## 2021-12-01 NOTE — ED Notes (Signed)
Patient given discharge instructions, all questions answered. Patient in possession of all belongings, directed to the discharge area  

## 2021-12-01 NOTE — Discharge Summary (Addendum)
[name] ZGY:[id number] DOB: 1971/01/25 DOA: 11/30/2021  PCP: Nena Polio, NP  Admit date: 11/30/2021  Discharge date: 12/01/2021  Admitted From: Home   Disposition:  Home   Recommendations for Outpatient Follow-up:   Follow up with PCP in 1-2 weeks  PCP Please obtain BMP/CBC, 2 view CXR in 1week,  (see Discharge instructions)   PCP Please follow up on the following pending results: Check CBC, CMP, ammonia level and a two-view chest x-ray in the next 7 to 10 days. Request PCP to obtain outpatient echocardiogram.  Kindly follow-up pending strep pneumonia and Legionella antigen results.   Home Health: None   Equipment/Devices: None  Consultations: None  Discharge Condition: Stable    CODE STATUS: Full    Diet Recommendation: Heart Healthy     Chief Complaint  Patient presents with   Rib Injury     Brief history of present illness from the day of admission and additional interim summary    51 y.o. female with medical history significant of asthma, chronic back pain, depression fibromyalgia, hypothyroidism, multiple sclerosis, peripheral neuropathy, dyslipidemia, hypothyroidism who presents to the hospital after she had an accident where her door swing on which she was laying broke and she fell on the ground hurting her mid back, subsequently her pet dog jumped on her chest and hurt her rib cage, she was having acute on chronic mid back and rib cage pain and came to the hospital.  Work-up here was unremarkable, CT scan showed changes suggestive of atelectasis versus possible pneumonia.                                                                 Hospital Course    Acute on chronic rib cage injury due to mechanical fall and dog jumping on her chest.  Patient having musculoskeletal pain, already on  extensive pain regimen at home for chronic pain, will add Bengay cream, added incentive spirometer as I think her changes on CT scan represent atelectasis caused by rib cage pain and not able to take big breaths.  Less likelihood of pneumonia although it cannot be completely ruled out hence will complete 5-day course of azithromycin.  She is clinically better and is wanting to be discharged immediately as she has chores to attend, she will be discharged home with 4 more days of azithromycin and topical Bengay cream along with her extensive home narcotic and pain control regimen.  Follow-up with PCP in a week.  Request PCP to check CBC, CMP, ammonia level and two-view chest x-ray in 7 to 10 days  Multiple sclerosis.  No acute issue.  Continue home regimen.  Hypokalemia.  Replaced.  Dehydration with AKI given IV fluids this morning.  AKI resolved.  Vaping.  Patient was sitting in bed when I entered and vaping,  she appeared to be in no discomfort, she was counseled to stop vaping.  Systolic soft heart murmur.  Request PCP to obtain outpatient echocardiogram.   Discharge diagnosis     Principal Problem:   CAP (community acquired pneumonia) Active Problems:   Multiple sclerosis ([place])   Hypothyroidism   Chronic pain   AKI (acute kidney injury) ([place])   UTI (urinary tract infection)   Hypokalemia   Murmur, cardiac    Discharge instructions    Discharge Instructions     Diet - low sodium heart healthy   Complete by: As directed    Discharge instructions   Complete by: As directed    Use the given incentive spirometer every 30 minutes while you are awake.  Stay active and sit in the chair as much as possible.  Follow with Primary MD Nena Polio, NP in 7 days   Get CBC, CMP, ammonia level, 2 view Chest X ray -  checked next visit within 1 week by Primary MD    Activity: As tolerated with Full fall precautions use walker/cane & assistance as needed  Disposition Home    Diet:  Heart Healthy    Special Instructions: If you have smoked or chewed Tobacco  in the last 2 yrs please stop smoking, stop any regular Alcohol  and or any Recreational drug use.  On your next visit with your primary care physician please Get Medicines reviewed and adjusted.  Please request your Prim.MD to go over all Hospital Tests and Procedure/Radiological results at the follow up, please get all Hospital records sent to your Prim MD by signing hospital release before you go home.  If you experience worsening of your admission symptoms, develop shortness of breath, life threatening emergency, suicidal or homicidal thoughts you must seek medical attention immediately by calling 911 or calling your MD immediately  if symptoms less severe.  You Must read complete instructions/literature along with all the possible adverse reactions/side effects for all the Medicines you take and that have been prescribed to you. Take any new Medicines after you have completely understood and accpet all the possible adverse reactions/side effects.   Increase activity slowly   Complete by: As directed        Discharge Medications   Allergies as of 12/01/2021       Reactions   Levaquin [levofloxacin In D5w] Shortness Of Breath   Levofloxacin Swelling   extremities   Bactrim [sulfamethoxazole-trimethoprim] Other (See Comments)   "really red all over, hot skinned"   Doxycycline Hives   Other    Seasonal allergies   Latex Rash        Medication List     STOP taking these medications    oxybutynin 5 MG 24 hr tablet Commonly known as: DITROPAN-XL       TAKE these medications    albuterol 108 (90 Base) MCG/ACT inhaler Commonly known as: VENTOLIN HFA Inhale 1-2 puffs into the lungs every 6 (six) hours as needed for wheezing or shortness of breath.   ALPRAZolam 0.5 MG tablet Commonly known as: XANAX Take 0.5 mg by mouth 3 (three) times daily as needed for anxiety.    amphetamine-dextroamphetamine 30 MG tablet Commonly known as: ADDERALL Take 30 mg by mouth 2 (two) times daily.   atorvastatin 10 MG tablet Commonly known as: LIPITOR Take 10 mg by mouth daily.   azithromycin 500 MG tablet Commonly known as: Zithromax Take 1 tablet (500 mg total) by mouth daily for 4 days. Take 1  tablet daily for 3 days.   baclofen 10 MG tablet Commonly known as: LIORESAL Take 10 mg by mouth 3 (three) times daily.   busPIRone 10 MG tablet Commonly known as: BUSPAR Take 10 mg by mouth 3 (three) times daily.   celecoxib 100 MG capsule Commonly known as: CELEBREX Take 100 mg by mouth 2 (two) times daily.   cetirizine 10 MG tablet Commonly known as: ZYRTEC Take 10 mg by mouth daily.   cholecalciferol 25 MCG (1000 UNIT) tablet Commonly known as: VITAMIN D3 Take 1,000 Units by mouth daily.   diclofenac Sodium 1 % Gel Commonly known as: VOLTAREN Apply 2 g topically 4 (four) times daily as needed (pain). What changed: Another medication with the same name was added. Make sure you understand how and when to take each.   diclofenac Sodium 1 % Gel Commonly known as: VOLTAREN Apply 2 g topically 4 (four) times daily. Apply to right chest wall and mid back What changed: You were already taking a medication with the same name, and this prescription was added. Make sure you understand how and when to take each.   DULoxetine 60 MG capsule Commonly known as: CYMBALTA Take 60 mg by mouth in the morning.   DULoxetine 30 MG capsule Commonly known as: CYMBALTA Take 30 mg by mouth at bedtime.   Fish Oil 1000 MG Caps Take 1 capsule by mouth daily.   furosemide 40 MG tablet Commonly known as: LASIX Take 40 mg by mouth daily.   hydrochlorothiazide 25 MG tablet Commonly known as: HYDRODIURIL Take 25 mg by mouth daily.   HYDROmorphone 2 MG tablet Commonly known as: DILAUDID Take 2 mg by mouth in the morning and at bedtime. for pain   levETIRAcetam 750 MG  tablet Commonly known as: KEPPRA Take 1,500 mg by mouth 2 (two) times daily.   liothyronine 5 MCG tablet Commonly known as: CYTOMEL Take 5 mcg by mouth daily.   lurasidone 80 MG Tabs tablet Commonly known as: LATUDA Take 80 mg by mouth at bedtime.   magnesium oxide 400 MG tablet Commonly known as: MAG-OX Take 400 mg by mouth in the morning, at noon, and at bedtime.   multivitamin with minerals tablet Take 1 tablet by mouth daily.   naloxone 4 MG/0.1ML Liqd nasal spray kit Commonly known as: NARCAN SMARTSIG:1 Spray(s) Both Nares Once PRN   ocrelizumab 300 MG/10ML injection Commonly known as: OCREVUS Inject 300 mg into the vein. Last infusion 02/2017   oxyCODONE-acetaminophen 10-325 MG tablet Commonly known as: Percocet Take 1 tablet by mouth every 4 (four) hours as needed for pain. What changed: when to take this   pantoprazole 40 MG tablet Commonly known as: PROTONIX Take 40 mg by mouth 2 (two) times daily.   pregabalin 150 MG capsule Commonly known as: LYRICA Take 150 mg by mouth in the morning, at noon, and at bedtime.   pregabalin 150 MG capsule Commonly known as: LYRICA Take 150 mg by mouth 3 (three) times daily.   Synthroid 112 MCG tablet Generic drug: levothyroxine Take 112 mcg by mouth daily.   tizanidine 6 MG capsule Commonly known as: ZANAFLEX Take 12 mg by mouth at bedtime.   valACYclovir 500 MG tablet Commonly known as: VALTREX Take 500 mg by mouth daily.   Vitamin B Complex Tabs Take 1 tablet by mouth daily.   zolpidem 10 MG tablet Commonly known as: AMBIEN Take 10 mg by mouth at bedtime as needed for sleep.  Follow-up Information     Nena Polio, NP. Schedule an appointment as soon as possible for a visit in 1 week(s).   Specialty: Family Medicine Contact information: [place] [address] [phone]                 Major procedures and Radiology Reports - PLEASE review detailed and final  reports thoroughly  -       CT Angio Chest PE W and/or Wo Contrast  Result Date: 11/30/2021 CLINICAL DATA:  Acute nonlocalized right flank pain. Patient fell on Saturday. Pain to the right ribs. Pulmonary embolus is suspected with unknown D-dimer. EXAM: CT ANGIOGRAPHY CHEST CT ABDOMEN AND PELVIS WITH CONTRAST TECHNIQUE: Multidetector CT imaging of the chest was performed using the standard protocol during bolus administration of intravenous contrast. Multiplanar CT image reconstructions and MIPs were obtained to evaluate the vascular anatomy. Multidetector CT imaging of the abdomen and pelvis was performed using the standard protocol during bolus administration of intravenous contrast. RADIATION DOSE REDUCTION: This exam was performed according to the departmental dose-optimization program which includes automated exposure control, adjustment of the mA and/or kV according to patient size and/or use of iterative reconstruction technique. CONTRAST:  192m OMNIPAQUE IOHEXOL 350 MG/ML SOLN COMPARISON:  CT chest 09/06/2017.  CT abdomen and pelvis 01/13/2007 FINDINGS: CTA CHEST FINDINGS Cardiovascular: Good opacification of the central and segmental pulmonary arteries with moderate motion artifact. No focal filling defects are identified. No evidence of significant pulmonary embolus. Normal heart size. No pericardial effusions. Normal caliber thoracic aorta. No dissection. Mediastinum/Nodes: Thyroid gland is unremarkable. Esophagus is decompressed. No significant lymphadenopathy. Lungs/Pleura: Emphysematous changes in the lungs. Patchy airspace disease in both lung bases and in the right upper lung. Changes could represent multifocal pneumonia or atelectasis. Contusion would be less likely. No pleural effusions. No pneumothorax. Bronchial wall thickening with some intraluminal secretions suggesting chronic bronchitis. Musculoskeletal: No chest wall abnormality. No acute or significant osseous findings. Review of the  MIP images confirms the above findings. CT ABDOMEN and PELVIS FINDINGS Hepatobiliary: No focal liver abnormality is seen. Gallbladder is not visualized, possibly contracted or surgically absent. No biliary dilatation. Pancreas: Unremarkable. No pancreatic ductal dilatation or surrounding inflammatory changes. Spleen: Normal in size without focal abnormality. Adrenals/Urinary Tract: Adrenal glands are unremarkable. Kidneys are normal, without renal calculi, focal lesion, or hydronephrosis. Bladder is unremarkable. Stomach/Bowel: Stomach is within normal limits. Appendix appears normal. No evidence of bowel wall thickening, distention, or inflammatory changes. Vascular/Lymphatic: No significant vascular findings are present. No enlarged abdominal or pelvic lymph nodes. Reproductive: Uterus and bilateral adnexa are unremarkable. Other: No abdominal wall hernia or abnormality. No abdominopelvic ascites. Musculoskeletal: Degenerative changes in the spine. No acute bony abnormalities identified. Review of the MIP images confirms the above findings. IMPRESSION: 1. No evidence of significant pulmonary embolus. 2. Emphysematous and chronic bronchitic changes in the lungs. 3. Patchy airspace disease in the lung bases and right upper lung, possibly multifocal pneumonia or atelectasis. Contusion would be less likely. 4. No acute process demonstrated in the abdomen or pelvis. Electronically Signed   By: WLucienne CapersM.D.   On: 11/30/2021 19:54   CT ABDOMEN PELVIS W CONTRAST  Result Date: 11/30/2021 CLINICAL DATA:  Acute nonlocalized right flank pain. Patient fell on Saturday. Pain to the right ribs. Pulmonary embolus is suspected with unknown D-dimer. EXAM: CT ANGIOGRAPHY CHEST CT ABDOMEN AND PELVIS WITH CONTRAST TECHNIQUE: Multidetector CT imaging of the chest was performed using the standard protocol during bolus administration of  intravenous contrast. Multiplanar CT image reconstructions and MIPs were obtained to  evaluate the vascular anatomy. Multidetector CT imaging of the abdomen and pelvis was performed using the standard protocol during bolus administration of intravenous contrast. RADIATION DOSE REDUCTION: This exam was performed according to the departmental dose-optimization program which includes automated exposure control, adjustment of the mA and/or kV according to patient size and/or use of iterative reconstruction technique. CONTRAST:  143m OMNIPAQUE IOHEXOL 350 MG/ML SOLN COMPARISON:  CT chest 09/06/2017.  CT abdomen and pelvis 01/13/2007 FINDINGS: CTA CHEST FINDINGS Cardiovascular: Good opacification of the central and segmental pulmonary arteries with moderate motion artifact. No focal filling defects are identified. No evidence of significant pulmonary embolus. Normal heart size. No pericardial effusions. Normal caliber thoracic aorta. No dissection. Mediastinum/Nodes: Thyroid gland is unremarkable. Esophagus is decompressed. No significant lymphadenopathy. Lungs/Pleura: Emphysematous changes in the lungs. Patchy airspace disease in both lung bases and in the right upper lung. Changes could represent multifocal pneumonia or atelectasis. Contusion would be less likely. No pleural effusions. No pneumothorax. Bronchial wall thickening with some intraluminal secretions suggesting chronic bronchitis. Musculoskeletal: No chest wall abnormality. No acute or significant osseous findings. Review of the MIP images confirms the above findings. CT ABDOMEN and PELVIS FINDINGS Hepatobiliary: No focal liver abnormality is seen. Gallbladder is not visualized, possibly contracted or surgically absent. No biliary dilatation. Pancreas: Unremarkable. No pancreatic ductal dilatation or surrounding inflammatory changes. Spleen: Normal in size without focal abnormality. Adrenals/Urinary Tract: Adrenal glands are unremarkable. Kidneys are normal, without renal calculi, focal lesion, or hydronephrosis. Bladder is unremarkable.  Stomach/Bowel: Stomach is within normal limits. Appendix appears normal. No evidence of bowel wall thickening, distention, or inflammatory changes. Vascular/Lymphatic: No significant vascular findings are present. No enlarged abdominal or pelvic lymph nodes. Reproductive: Uterus and bilateral adnexa are unremarkable. Other: No abdominal wall hernia or abnormality. No abdominopelvic ascites. Musculoskeletal: Degenerative changes in the spine. No acute bony abnormalities identified. Review of the MIP images confirms the above findings. IMPRESSION: 1. No evidence of significant pulmonary embolus. 2. Emphysematous and chronic bronchitic changes in the lungs. 3. Patchy airspace disease in the lung bases and right upper lung, possibly multifocal pneumonia or atelectasis. Contusion would be less likely. 4. No acute process demonstrated in the abdomen or pelvis. Electronically Signed   By: WLucienne CapersM.D.   On: 11/30/2021 19:54   DG Chest 2 View  Result Date: 11/30/2021 CLINICAL DATA:  Chest pain. EXAM: CHEST - 2 VIEW COMPARISON:  09/09/2017 FINDINGS: Normal cardiomediastinal contours. The lungs appear hyperinflated. No pleural effusion or edema identified. No airspace opacities. Scarring is noted within both lung bases. IMPRESSION: 1. No acute cardiopulmonary abnormalities. 2. Bibasilar scarring. Electronically Signed   By: TKerby MoorsM.D.   On: 11/30/2021 14:52      Today   Subjective    KAlaena Stradertoday has no headache,no chest abdominal pain,no new weakness tingling or numbness, feels much better wants to go home today.     Objective   Blood pressure 106/77, pulse 61, temperature 98.2 F (36.8 C), temperature source Oral, resp. rate 18, last menstrual period 12/30/2012, SpO2 96 %.  No intake or output data in the 24 hours ending 12/01/21 0832  Exam  Awake Alert, No new F.N deficits,    [place].AT,PERRAL Supple Neck,   Symmetrical Chest wall movement, Good air movement bilaterally,  CTAB RRR,No Gallops,   +ve B.Sounds, Abd Soft, Non tender,  No Cyanosis, no visible bruising in the right chest wall or upper back  at the site of pain and tenderness, examined with female chaperone in the room   Data Review   Recent Labs  Lab 11/30/21 1420 12/01/21 0555  WBC 16.9* 9.3  HGB 16.2* 14.1  HCT 46.4* 40.3  PLT 208 189  MCV 101.5* 102.5*  MCH 35.4* 35.9*  MCHC 34.9 35.0  RDW 12.0 11.9  LYMPHSABS 1.5  --   MONOABS 1.7*  --   EOSABS 0.2  --   BASOSABS 0.1  --     Recent Labs  Lab 11/30/21 1420 11/30/21 1711 11/30/21 1815 11/30/21 2114 12/01/21 0555  NA 140  --   --   --  139  K 3.2*  --   --   --  3.2*  CL 101  --   --   --  100  CO2 26  --   --   --  30  GLUCOSE 108*  --   --   --  121*  BUN 17  --   --   --  20  CREATININE 1.13*  --   --   --  0.88  CALCIUM 10.0  --   --   --  9.1  AST 20  --   --  20 19  ALT 20  --   --  20 19  ALKPHOS 91  --   --  89 80  BILITOT 1.0  --   --  0.7 0.6  ALBUMIN 4.4  --   --  4.2 3.6  MG  --   --   --  2.3  --   PHOS  --   --   --  3.8  --   PROCALCITON  --   --   --  <0.10  --   LATICACIDVEN  --  1.0 1.1  --   --   INR  --   --   --  1.0  --   AMMONIA  --   --   --  41*  --     Total Time in preparing paper work, data evaluation and todays exam - 61 minutes  Lala Lund M.D on 12/01/2021 at 8:32 AM  [facility]

## 2021-12-01 NOTE — Evaluation (Signed)
Occupational Therapy Evaluation and Discharge Patient Details Name: [name] MRN: [id number] DOB: 02-22-1971 Today's Date: 12/01/2021   History of Present Illness 51 y.o. female admitted for community-acquired pneumonia UTI and AKI. PHMx: asthma, chronic back pain, depression, fibromyalgia, hypothyroidism, 3 spinal abscess, multiple sclerosis neuropathy.   Clinical Impression   This 51 yo female admitted with above presents to acute OT with no further OT needs, pt is Independent with all basic ADLs and mobility just moving slower due pain from fall she had last Saturday. Acute OT will sign off.      Recommendations for follow up therapy are one component of a multi-disciplinary discharge planning process, led by the attending physician.  Recommendations may be updated based on patient status, additional functional criteria and insurance authorization.   Follow Up Recommendations  No OT follow up    Assistance Recommended at Discharge None  Patient can return home with the following Assistance with cooking/housework    Functional Status Assessment  Patient has had a recent decline in their functional status and demonstrates the ability to make significant improvements in function in a reasonable and predictable amount of time. (but no further skilled OT needs identified)  Equipment Recommendations  None recommended by OT       Precautions / Restrictions Precautions Precautions: None Restrictions Weight Bearing Restrictions: No      Mobility Bed Mobility Overal bed mobility: Independent                  Transfers Overall transfer level: Independent                        Balance Overall balance assessment: No apparent balance deficits (not formally assessed)                                         ADL either performed or assessed with clinical judgement   ADL                                         General  ADL Comments: Pt was fully dressed when I entered and reported she did it all on her own just slowly due to pain. She also reports getting up to bathroom on her own and I did see her up on her feet in room when I went back in to tell her something--no balance issues noted, pt just wanting to leave.     Vision Patient Visual Report: No change from baseline              Pertinent Vitals/Pain Pain Assessment Pain Assessment: Faces Faces Pain Scale: Hurts even more Pain Location: where she feel on tree roots and dog jumped on her while she was still on the ground Pain Descriptors / Indicators: Aching, Sore Pain Intervention(s): Limited activity within patient's tolerance     Hand Dominance Right   Extremity/Trunk Assessment Upper Extremity Assessment Upper Extremity Assessment: Overall WFL for tasks assessed           Communication Communication Communication: No difficulties   Cognition Arousal/Alertness: Awake/alert Behavior During Therapy:  (labile) Overall Cognitive Status: Within Functional Limits for tasks assessed  Home Living Family/patient expects to be discharged to:: Private residence Living Arrangements: Spouse/significant other Available Help at Discharge: Family;Available PRN/intermittently                                    Prior Functioning/Environment Prior Level of Function : Independent/Modified Independent                        OT Problem List: Pain         OT Goals(Current goals can be found in the care plan section) Acute Rehab OT Goals Patient Stated Goal: to go home ASAP         AM-PAC OT "6 Clicks" Daily Activity     Outcome Measure Help from another person eating meals?: None Help from another person taking care of personal grooming?: None Help from another person toileting, which includes using toliet, bedpan, or urinal?: None Help from  another person bathing (including washing, rinsing, drying)?: None Help from another person to put on and taking off regular upper body clothing?: None Help from another person to put on and taking off regular lower body clothing?: None 6 Click Score: 24   End of Session Nurse Communication:  (pt asking about prescriptions for home and discharge paperwork because she is ready to go)  Activity Tolerance: Patient tolerated treatment well Patient left:  (sitting on edge of stretcher)  OT Visit Diagnosis: Pain Pain - part of body:  (lower back, rib area)                Time: 0277-4128 OT Time Calculation (min): 20 min Charges:  OT General Charges $OT Visit: 1 Visit OT Evaluation $OT Eval Low Complexity: 1 Low  Ignacia Palma, OTR/L [facility] [phone] Office [phone]    Evette Georges 12/01/2021, 12:20 PM

## 2021-12-02 LAB — URINE CULTURE: Culture: 10000 — AB

## 2021-12-05 LAB — CULTURE, BLOOD (ROUTINE X 2)
Culture: NO GROWTH
Special Requests: ADEQUATE

## 2021-12-07 ENCOUNTER — Telehealth: Payer: Self-pay

## 2021-12-07 NOTE — Telephone Encounter (Signed)
RNCM received inbound call from patient stating she wants to speak with Dr. Rodena Medin re: her previous ED visit and she felt he didn't tell her about her fracture and partial collapsed lung. This RNCM advised patient to follow up with PCP. Patient reports she has an appt on tomorrow with PCP. RNCM advised once she's discharged the EDP does not make follow up calls.

## 2021-12-09 LAB — CULTURE, BLOOD (ROUTINE X 2): Special Requests: ADEQUATE

## 2021-12-10 ENCOUNTER — Telehealth (HOSPITAL_BASED_OUTPATIENT_CLINIC_OR_DEPARTMENT_OTHER): Payer: Self-pay | Admitting: *Deleted

## 2021-12-10 NOTE — Telephone Encounter (Signed)
Post ED Visit - Positive Culture Follow-up  Culture report reviewed by antimicrobial stewardship pharmacist: [facility] []  , Pharm.D. []  Enzo Bi, Pharm.D., BCPS AQ-ID []  , Pharm.D., BCPS []  Celedonio Miyamoto, Pharm.D., BCPS []  [place], Garvin Fila.D., BCPS, AAHIVP []  , Pharm.D., BCPS, AAHIVP []  Georgina Pillion, PharmD, BCPS []  , PharmD, BCPS []  [place], PharmD, BCPS []  [place], PharmD []  , PharmD, BCPS [x]  Estella Husk, PharmD  Pharmacy Team []  Lysle Pearl, PharmD []  , PharmD []  Phillips Climes, PharmD []  , Rph []  Agapito Games) , PharmD []  Verlan Friends, PharmD []  , PharmD []  Mervyn Gay, PharmD []  , PharmD []  Delmar Landau, PharmD []  [facility], PharmD []  , PharmD []  Len Childs, PharmD   Positive blood culture Treated with Azythromycin,and no further patient follow-up is required at this time.  12/10/2021, 12:31 PM

## 2021-12-10 NOTE — Progress Notes (Signed)
ED Antimicrobial Stewardship Positive Culture Follow Up   [name] is an 51 y.o. female who presented to [facility] on 11/30/2021 with a chief complaint of  Chief Complaint  Patient presents with   Rib Injury    Recent Results (from the past 720 hour(s))  Resp Panel by RT-PCR (Flu A&B, Covid) Anterior Nasal Swab     Status: None   Collection Time: 11/30/21  4:53 PM   Specimen: Anterior Nasal Swab  Result Value Ref Range Status   SARS Coronavirus 2 by RT PCR NEGATIVE NEGATIVE Final    Comment: (NOTE) SARS-CoV-2 target nucleic acids are NOT DETECTED.  The SARS-CoV-2 RNA is generally detectable in upper respiratory specimens during the acute phase of infection. The lowest concentration of SARS-CoV-2 viral copies this assay can detect is 138 copies/mL. A negative result does not preclude SARS-Cov-2 infection and should not be used as the sole basis for treatment or other patient management decisions. A negative result may occur with  improper specimen collection/handling, submission of specimen other than nasopharyngeal swab, presence of viral mutation(s) within the areas targeted by this assay, and inadequate number of viral copies(<138 copies/mL). A negative result must be combined with clinical observations, patient history, and epidemiological information. The expected result is Negative.  Fact Sheet for Patients:  BloggerCourse.com  Fact Sheet for Healthcare Providers:  SeriousBroker.it  This test is no t yet approved or cleared by the [place] FDA and  has been authorized for detection and/or diagnosis of SARS-CoV-2 by FDA under an Emergency Use Authorization (EUA). This EUA will remain  in effect (meaning this test can be used) for the duration of the COVID-19 declaration under Section 564(b)(1) of the Act, 21 U.S.C.section 360bbb-3(b)(1), unless the authorization is terminated  or revoked sooner.        Influenza A by PCR NEGATIVE NEGATIVE Final   Influenza B by PCR NEGATIVE NEGATIVE Final    Comment: (NOTE) The Xpert Xpress SARS-CoV-2/FLU/RSV plus assay is intended as an aid in the diagnosis of influenza from Nasopharyngeal swab specimens and should not be used as a sole basis for treatment. Nasal washings and aspirates are unacceptable for Xpert Xpress SARS-CoV-2/FLU/RSV testing.  Fact Sheet for Patients: BloggerCourse.com  Fact Sheet for Healthcare Providers: SeriousBroker.it  This test is not yet approved or cleared by the [place] FDA and has been authorized for detection and/or diagnosis of SARS-CoV-2 by FDA under an Emergency Use Authorization (EUA). This EUA will remain in effect (meaning this test can be used) for the duration of the COVID-19 declaration under Section 564(b)(1) of the Act, 21 U.S.C. section 360bbb-3(b)(1), unless the authorization is terminated or revoked.  Performed at [facility], [address] [place], [place] [address]   Culture, blood (routine x 2)     Status: None   Collection Time: 11/30/21  5:11 PM   Specimen: BLOOD  Result Value Ref Range Status   Specimen Description BLOOD LEFT ANTECUBITAL  Final   Special Requests   Final    BOTTLES DRAWN AEROBIC AND ANAEROBIC Blood Culture adequate volume   Culture   Final    NO GROWTH 5 DAYS Performed at [facility], [address], [place], [place] [address]    Report Status 12/05/2021 FINAL  Final  Culture, blood (routine x 2)     Status: None   Collection Time: 11/30/21  6:15 PM   Specimen: BLOOD RIGHT ARM  Result Value Ref Range Status   Specimen Description  BLOOD RIGHT ARM  Final   Special Requests   Final    BOTTLES DRAWN AEROBIC AND ANAEROBIC Blood Culture adequate volume   Culture   Final    TEST WILL BE CREDITED Performed at [facility], [address] [place], [place] [address]    Report Status 12/09/2021  FINAL  Final  Urine Culture     Status: Abnormal   Collection Time: 11/30/21  6:44 PM   Specimen: Urine, Clean Catch  Result Value Ref Range Status   Specimen Description URINE, CLEAN CATCH  Final   Special Requests   Final    NONE Performed at [facility], [address] [place], [place] [address]    Culture (A)  Final    10,000 COLONIES/mL MULTIPLE SPECIES PRESENT, SUGGEST RECOLLECTION   Report Status 12/02/2021 FINAL  Final    [x]  Given  time since blood culture obtainment and notification of insufficient quantity for culture and that patient has had multiple f/u encounters with PCP and OSH ED with evaluation, and has f/u scheduled on Monday, No further action needed per MD [place].  ED Provider: [place], PharmD, BCPS 12/10/2021 10:06 AM ED Clinical Pharmacist -  [phone]

## 2021-12-18 ENCOUNTER — Telehealth: Payer: Self-pay | Admitting: *Deleted

## 2021-12-18 NOTE — Telephone Encounter (Signed)
Received Ocrevus start form from [place]. Called [place], spoke with Jesusita Oka to clarify what was needed. He stated the patient doesn't need to fill out patient information form page 4, but page 5 needs to be completed for her to continue to get services from the patient foundation, MD signature not required. Prescriber service form, Page 5 filled out, faxed to [employer]. Received confirmation.  MD signed prescriber foundation form, faxed to [facility] Patient foundation, received confirmation.

## 2021-12-18 NOTE — Telephone Encounter (Signed)
Received prescriber form for [employer] PAP. Current form on file expires on 01/13/22.  Filled out, placed on MD desk for review, signature.

## 2022-03-12 ENCOUNTER — Encounter: Payer: Self-pay | Admitting: Diagnostic Neuroimaging

## 2022-03-12 ENCOUNTER — Ambulatory Visit (INDEPENDENT_AMBULATORY_CARE_PROVIDER_SITE_OTHER): Payer: Medicare Other | Admitting: Diagnostic Neuroimaging

## 2022-03-12 VITALS — BP 110/70 | HR 72 | Ht 67.0 in | Wt 187.6 lb

## 2022-03-12 DIAGNOSIS — E559 Vitamin D deficiency, unspecified: Secondary | ICD-10-CM

## 2022-03-12 DIAGNOSIS — G35 Multiple sclerosis: Secondary | ICD-10-CM | POA: Diagnosis not present

## 2022-03-12 NOTE — Patient Instructions (Addendum)
   

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
## 2022-03-15 ENCOUNTER — Telehealth: Payer: Self-pay | Admitting: Diagnostic Neuroimaging

## 2022-03-15 LAB — IMMUNOGLOBULINS A/E/G/M, SERUM
IgA/Immunoglobulin A, Serum: 97 mg/dL (ref 87–352)
IgE (Immunoglobulin E), Serum: 9 IU/mL (ref 6–495)
IgG (Immunoglobin G), Serum: 1054 mg/dL (ref 586–1602)
IgM (Immunoglobulin M), Srm: 93 mg/dL (ref 26–217)

## 2022-03-15 NOTE — Telephone Encounter (Signed)
medicare NPR [facility] [place] Case Number: [id number] sent to GI [phone]

## 2022-03-21 ENCOUNTER — Other Ambulatory Visit: Payer: Self-pay | Admitting: *Deleted

## 2022-03-21 DIAGNOSIS — G35 Multiple sclerosis: Secondary | ICD-10-CM

## 2022-03-23 ENCOUNTER — Non-Acute Institutional Stay (HOSPITAL_COMMUNITY)
Admission: RE | Admit: 2022-03-23 | Discharge: 2022-03-23 | Disposition: A | Discharge status: Home / Self Care | Payer: Medicare Other | Source: Ambulatory Visit | Attending: Internal Medicine | Admitting: Internal Medicine

## 2022-03-23 DIAGNOSIS — G35 Multiple sclerosis: Secondary | ICD-10-CM | POA: Insufficient documentation

## 2022-03-23 MED ORDER — FAMOTIDINE IN NACL 20-0.9 MG/50ML-% IV SOLN
20.0000 mg | Freq: Once | INTRAVENOUS | Status: AC
  Administered 2022-03-23: 20 mg via INTRAVENOUS
  Filled 2022-03-23: qty 50

## 2022-03-23 MED ORDER — DIPHENHYDRAMINE HCL 50 MG/ML IJ SOLN
25.0000 mg | Freq: Once | INTRAMUSCULAR | Status: AC
Start: 2022-03-23 — End: 2022-03-23
  Administered 2022-03-23: 25 mg via INTRAVENOUS
  Filled 2022-03-23: qty 1

## 2022-03-23 MED ORDER — METHYLPREDNISOLONE SODIUM SUCC 125 MG IJ SOLR
125.0000 mg | Freq: Once | INTRAMUSCULAR | Status: AC
  Administered 2022-03-23: 125 mg via INTRAVENOUS
  Filled 2022-03-23: qty 2

## 2022-03-23 MED ORDER — DIPHENHYDRAMINE HCL 50 MG/ML IJ SOLN
50.0000 mg | Freq: Once | INTRAMUSCULAR | Status: AC
  Administered 2022-03-23: 50 mg via INTRAVENOUS
  Filled 2022-03-23: qty 1

## 2022-03-23 MED ORDER — SODIUM CHLORIDE 0.9 % IV SOLN
600.0000 mg | Freq: Once | INTRAVENOUS | Status: AC
  Administered 2022-03-23: 600 mg via INTRAVENOUS
  Filled 2022-03-23: qty 20

## 2022-03-23 MED ORDER — SODIUM CHLORIDE 0.9 % IV SOLN: INTRAVENOUS | Status: DC | PRN

## 2022-03-23 MED ORDER — ACETAMINOPHEN 325 MG PO TABS
650.0000 mg | ORAL_TABLET | Freq: Once | ORAL | Status: AC
  Administered 2022-03-23: 650 mg via ORAL
  Filled 2022-03-23: qty 2

## 2022-03-23 NOTE — Progress Notes (Signed)
PATIENT CARE CENTER NOTE  Diagnosis: Multiple sclerosis (HCC) [G35]    Provider: Joycelyn Schmid, MD  Procedure: Ocrevus 600 mg  Note: Patient received Ocrevus 600 mg infusion (dose #1 of 1) via PIV. Pt received pre medications (Tylenol 650 mg PO, IV Benadryl 50 mg, IV Solu-medrol 125 mg, and Pepcid IVPB 20 mg) as ordered. During infusion pt complained of itchy throat, infusion  paused and PRN IV Benadryl 25 mg given, vital signs taken and remained stable at this time. Pt stated that this has happened in the past, and she needed extra dose of Benadryl. Pt stated that itchiness has resolved after PRN Benadryll given. Infusion restarted at slower rate (80 ml/hr). Infusion continued to be titrated as ordered, and pt tolerated duration of infusion with no issues. After infusion, Vital signs stable. Discharge instructions given. Patient declined to stay for 1 hour post infusion observation. Patient advised to schedule next appointment at front desk. Alert, oriented and ambulatory at discharge.

## 2022-03-27 ENCOUNTER — Ambulatory Visit
Admission: RE | Admit: 2022-03-27 | Discharge: 2022-03-27 | Disposition: A | Discharge status: Home / Self Care | Payer: Medicare Other | Source: Ambulatory Visit | Attending: Diagnostic Neuroimaging | Admitting: Diagnostic Neuroimaging

## 2022-03-27 DIAGNOSIS — G35 Multiple sclerosis: Secondary | ICD-10-CM | POA: Diagnosis not present

## 2022-03-27 DIAGNOSIS — E559 Vitamin D deficiency, unspecified: Secondary | ICD-10-CM

## 2022-09-13 ENCOUNTER — Telehealth: Payer: Self-pay | Admitting: Diagnostic Neuroimaging

## 2022-09-13 ENCOUNTER — Encounter: Payer: Self-pay | Admitting: Diagnostic Neuroimaging

## 2022-09-13 ENCOUNTER — Ambulatory Visit: Payer: Medicare Other | Admitting: Diagnostic Neuroimaging

## 2022-09-13 NOTE — Progress Notes (Deleted)
Pt is scheduled for Ocrevus infusion next Friday. I will place orders for her after her apt today. The infusion wanted to recommend additional dose of benadryl in case she needs it. Last time she had throat irritation during infusion and it resolved with additional dose of benadryl. Also may need updated labs.

## 2022-09-13 NOTE — Telephone Encounter (Signed)
Pt has rescheduled her appointment that she missed today, it unfortunately is after her upcoming infusion.  Pt is asking if she needs to r/s her infusion, please call.

## 2022-09-14 ENCOUNTER — Encounter: Payer: Self-pay | Admitting: Diagnostic Neuroimaging

## 2022-09-17 ENCOUNTER — Other Ambulatory Visit: Payer: Self-pay | Admitting: Neurology

## 2022-09-17 NOTE — Telephone Encounter (Signed)
Called the patient back and there was no answer. She is in need of lab work prior to her infusion. I have an opening with Dr Marjory Lies today 5/6 at 11:30 am or tomorrow (5/7) at 2:30 pm. Left a detailed message advising of this information.

## 2022-09-18 ENCOUNTER — Encounter: Payer: Self-pay | Admitting: Diagnostic Neuroimaging

## 2022-09-18 ENCOUNTER — Ambulatory Visit (INDEPENDENT_AMBULATORY_CARE_PROVIDER_SITE_OTHER): Payer: Medicare Other | Admitting: Diagnostic Neuroimaging

## 2022-09-18 VITALS — BP 104/62 | HR 69 | Ht 65.0 in | Wt 172.0 lb

## 2022-09-18 DIAGNOSIS — G35 Multiple sclerosis: Secondary | ICD-10-CM
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
## 2022-09-20 ENCOUNTER — Telehealth: Payer: Self-pay | Admitting: Neurology

## 2022-09-20 NOTE — Telephone Encounter (Signed)
Pt came in for visit and discussed starting Kesimpta. Start form signed and completed. Faxed to [employer] and received confirmation

## 2022-09-21 ENCOUNTER — Telehealth: Payer: Self-pay | Admitting: Diagnostic Neuroimaging

## 2022-09-21 ENCOUNTER — Encounter (HOSPITAL_COMMUNITY): Payer: Medicare Other

## 2022-09-21 NOTE — Telephone Encounter (Signed)
Stephanie called from [place]. Stated she needs the bridge to commercial form that was left out. Stated form can be faxed to [phone]

## 2022-09-22 LAB — CBC WITH DIFFERENTIAL/PLATELET
Basophils Absolute: 0.1 10*3/uL (ref 0.0–0.2)
Basos: 1 %
EOS (ABSOLUTE): 0.2 10*3/uL (ref 0.0–0.4)
Eos: 2 %
Hematocrit: 44.6 % (ref 34.0–46.6)
Hemoglobin: 15.3 g/dL (ref 11.1–15.9)
Immature Grans (Abs): 0 10*3/uL (ref 0.0–0.1)
Immature Granulocytes: 0 %
Lymphocytes Absolute: 1.4 10*3/uL (ref 0.7–3.1)
Lymphs: 21 %
MCH: 35.2 pg — ABNORMAL HIGH (ref 26.6–33.0)
MCHC: 34.3 g/dL (ref 31.5–35.7)
MCV: 103 fL — ABNORMAL HIGH (ref 79–97)
Monocytes Absolute: 0.7 10*3/uL (ref 0.1–0.9)
Monocytes: 11 %
Neutrophils Absolute: 4.1 10*3/uL (ref 1.4–7.0)
Neutrophils: 65 %
Platelets: 168 10*3/uL (ref 150–450)
RBC: 4.35 x10E6/uL (ref 3.77–5.28)
RDW: 11.2 % — ABNORMAL LOW (ref 11.7–15.4)
WBC: 6.5 10*3/uL (ref 3.4–10.8)

## 2022-09-22 LAB — IMMUNOGLOBULINS A/E/G/M, SERUM
IgA/Immunoglobulin A, Serum: 93 mg/dL (ref 87–352)
IgE (Immunoglobulin E), Serum: 10 IU/mL (ref 6–495)
IgG (Immunoglobin G), Serum: 1098 mg/dL (ref 586–1602)
IgM (Immunoglobulin M), Srm: 96 mg/dL (ref 26–217)

## 2022-09-24 ENCOUNTER — Telehealth: Payer: Self-pay

## 2022-09-24 NOTE — Telephone Encounter (Signed)
Contacted pt regarding labs, informed her labs are unremarkable, no concerns noted. informed to continue current plan and keep FU as scheduled.  Advised to call the office back with any questions or concerns as she had none at this time and was appreciative.

## 2022-09-24 NOTE — Telephone Encounter (Signed)
Penumalli, Glenford Bayley, MD  P Gna-Pod 3 Results Unremarkable labs. Continue current plan. Please call patient. -VRP

## 2022-09-24 NOTE — Telephone Encounter (Signed)
Completed this on the form and have faxed into [employer].

## 2022-09-24 NOTE — Telephone Encounter (Signed)
Completed PA for Kesimpta via CMM. Sent to [facility]. Key: [id number]. Should have a determination within 1-3 business days.

## 2022-09-24 NOTE — Telephone Encounter (Signed)
PA for Kesimpta was approved by [facility]. "Request Reference Number: [id number]. KESIMPTA INJ 20/. is approved through 09/24/2023. Your patient may now fill this prescription and it will be covered.. Authorization Expiration Date: Sep 24, 2023."

## 2022-09-26 MED ORDER — KESIMPTA 20 MG/0.4ML ~~LOC~~ SOAJ: 20.0000 mg | SUBCUTANEOUS | 5 refills | Status: DC

## 2022-09-26 MED ORDER — KESIMPTA 20 MG/0.4ML ~~LOC~~ SOAJ: 20.0000 mg | SUBCUTANEOUS | 0 refills | Status: DC

## 2022-09-26 NOTE — Telephone Encounter (Signed)
Pt stated she needs to talk to nurse about PA for Kesimpta. Stated she talked to [facility] and was told they don't have a prescription. Fabian November is requesting a call back to discuss

## 2022-09-26 NOTE — Addendum Note (Signed)
Addended by: Judi Cong on: 09/26/2022 02:40 PM   Modules accepted: Orders

## 2022-09-26 NOTE — Telephone Encounter (Signed)
I will forward the loading dose and maintenance dose to [facility] for the pt PA is approved and they should be able to fill it

## 2022-10-02 ENCOUNTER — Telehealth: Payer: Self-pay | Admitting: Diagnostic Neuroimaging

## 2022-10-02 NOTE — Telephone Encounter (Signed)
Gerre Pebbles called from [place]. Stated he needs to clarify prescription Ofatumumab (KESIMPTA) 20 MG/0.4ML SOAJ.

## 2022-10-02 NOTE — Telephone Encounter (Signed)
I called [facility]. I spoke with Matt, pharmD. I confirmed Kesimpta dose of 20mg  SQ on week 0,1,& 2. 20mg  SQ once monthly beginning on week 4.

## 2022-10-15 ENCOUNTER — Other Ambulatory Visit: Payer: Self-pay | Admitting: Neurology

## 2022-10-17 ENCOUNTER — Encounter (HOSPITAL_BASED_OUTPATIENT_CLINIC_OR_DEPARTMENT_OTHER): Payer: Medicare Other | Admitting: Internal Medicine

## 2022-10-18 ENCOUNTER — Other Ambulatory Visit: Payer: Self-pay

## 2022-10-18 ENCOUNTER — Encounter (HOSPITAL_BASED_OUTPATIENT_CLINIC_OR_DEPARTMENT_OTHER): Payer: Self-pay

## 2022-10-18 ENCOUNTER — Ambulatory Visit (HOSPITAL_COMMUNITY)
Admission: RE | Admit: 2022-10-18 | Discharge: 2022-10-18 | Disposition: A | Discharge status: Home / Self Care | Payer: Medicare Other | Source: Ambulatory Visit | Attending: Internal Medicine | Admitting: Internal Medicine

## 2022-10-18 ENCOUNTER — Encounter (HOSPITAL_COMMUNITY)
Admission: RE | Admit: 2022-10-18 | Discharge: 2022-10-18 | Disposition: A | Discharge status: Home / Self Care | Payer: Medicare Other | Source: Ambulatory Visit

## 2022-10-18 ENCOUNTER — Other Ambulatory Visit (HOSPITAL_COMMUNITY): Payer: Self-pay | Admitting: Internal Medicine

## 2022-10-18 ENCOUNTER — Encounter (HOSPITAL_BASED_OUTPATIENT_CLINIC_OR_DEPARTMENT_OTHER): Payer: Medicare Other | Attending: Internal Medicine | Admitting: Internal Medicine

## 2022-10-18 DIAGNOSIS — X58XXXD Exposure to other specified factors, subsequent encounter: Secondary | ICD-10-CM | POA: Diagnosis not present

## 2022-10-18 DIAGNOSIS — T86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: Secondary | ICD-10-CM | POA: Insufficient documentation

## 2022-10-18 DIAGNOSIS — Z01818 Encounter for other preprocedural examination: Secondary | ICD-10-CM | POA: Insufficient documentation

## 2022-10-18 DIAGNOSIS — Z9289 Personal history of other medical treatment: Secondary | ICD-10-CM

## 2022-10-18 DIAGNOSIS — I10 Essential (primary) hypertension: Secondary | ICD-10-CM | POA: Diagnosis not present

## 2022-10-18 DIAGNOSIS — G35 Multiple sclerosis: Secondary | ICD-10-CM | POA: Insufficient documentation

## 2022-10-18 DIAGNOSIS — T8131XD Disruption of external operation (surgical) wound, not elsewhere classified, subsequent encounter: Secondary | ICD-10-CM | POA: Diagnosis not present

## 2022-10-18 DIAGNOSIS — Z87891 Personal history of nicotine dependence: Secondary | ICD-10-CM | POA: Diagnosis not present

## 2022-10-18 DIAGNOSIS — H0270 Unspecified degenerative disorders of eyelid and periocular area: Secondary | ICD-10-CM | POA: Insufficient documentation

## 2022-10-19 NOTE — Progress Notes (Signed)
NORRIS, HELL ([id number]) [id number] Page 1 of 4 Visit Report for 10/18/2022 Abuse Risk Screen Details Patient Name: Date of Service: ANTWANIQUE, JUNCKER 10/18/2022 1:00 PM Medical Record Number: [id number] Patient Account Number: [id number] Date of Birth/Sex: Treating RN: [birth year] (52 y.o. [name], [name] Primary Care Alexius Hangartner: Janece Canterbury Other Clinician: Referring Emon Miggins: Treating Azlyn Wingler/Extender: Orpah Melter in Treatment: 0 Abuse Risk Screen Items Answer ABUSE RISK SCREEN: Has anyone close to you tried to hurt or harm you recentlyo No Do you feel uncomfortable with anyone in your familyo No Has anyone forced you do things that you didnt want to doo No Electronic Signature(s) Signed: 10/18/2022 6:21:23 PM By: Shawn Stall RN, BSN Entered By: Shawn Stall on 10/18/2022 13:42:25 -------------------------------------------------------------------------------- Activities of Daily Living Details Patient Name: Date of Service: [name], Maddelynn 10/18/2022 1:00 PM Medical Record Number: [id number] Patient Account Number: [id number] Date of Birth/Sex: Treating RN: Sep 12, 1970 (52 y.o. [name], [name] Primary Care Zeena Starkel: Janece Canterbury Other Clinician: Referring Zarin Hagmann: Treating Philopateer Strine/Extender: Orpah Melter in Treatment: 0 Activities of Daily Living Items Answer Activities of Daily Living (Please select one for each item) Drive Automobile Not Able T Medications ake Completely Able Use T elephone Completely Able Care for Appearance Completely Able Use T oilet Completely Able Bath / Shower Completely Able Dress Self Completely Able Feed Self Completely Able Walk Completely Able Get In / Out Bed Completely Able Housework Completely Able Prepare Meals Completely Able Handle Money Completely Able Shop for Self Completely Able Electronic Signature(s) Signed: 10/18/2022 6:21:23 PM By:  Shawn Stall RN, BSN Entered By: Shawn Stall on 10/18/2022 13:42:41 -------------------------------------------------------------------------------- Education Screening Details Patient Name: Date of Service: [name], Yaritzel 10/18/2022 1:00 PM Medical Record Number: [id number] Patient Account Number: [id number] Date of Birth/Sex: Treating RN: 1970-12-27 (52 y.o. Arta Silence Primary Care Dalyn Becker: Janece Canterbury Other Clinician: Referring Reta Norgren: Treating India Jolin/Extender: Orpah Melter in Treatment: 0 [name] ([id number]) [id number] Page 2 of 4 Primary Learner Assessed: Patient Learning Preferences/Education Level/Primary Language Learning Preference: Explanation, Demonstration, Printed Material Highest Education Level: High School Preferred Language: Economist Language Barrier: No Translator Needed: No Memory Deficit: No Emotional Barrier: No Cultural/Religious Beliefs Affecting Medical Care: No Physical Barrier Impaired Vision: No Impaired Hearing: No Decreased Hand dexterity: No Knowledge/Comprehension Knowledge Level: High Comprehension Level: High Ability to understand written instructions: High Ability to understand verbal instructions: High Motivation Anxiety Level: Calm Cooperation: Cooperative Education Importance: Acknowledges Need Interest in Health Problems: Asks Questions Perception: Coherent Willingness to Engage in Self-Management High Activities: Readiness to Engage in Self-Management High Activities: Electronic Signature(s) Signed: 10/18/2022 6:21:23 PM By: Shawn Stall RN, BSN Entered By: Shawn Stall on 10/18/2022 13:43:09 -------------------------------------------------------------------------------- Fall Risk Assessment Details Patient Name: Date of Service: [name], Shenita 10/18/2022 1:00 PM Medical Record Number: [id number] Patient Account Number:  [id number] Date of Birth/Sex: Treating RN: Dec 14, 1970 (52 y.o. [name], [name] Primary Care Emilee Market: Janece Canterbury Other Clinician: Referring Jontavious Commons: Treating Shahir Karen/Extender: Orpah Melter in Treatment: 0 Fall Risk Assessment Items Have you had 2 or more falls in the last 12 monthso 0 No Have you had any fall that resulted in injury in the last 12 monthso 0 No FALLS RISK SCREEN History of falling - immediate or within 3 months 0 No Secondary diagnosis (Do you have 2 or more medical diagnoseso) 0 No Ambulatory aid None/bed rest/wheelchair/nurse 0 Yes Crutches/cane/walker 0 No Furniture 0  No Intravenous therapy Access/Saline/Heparin Lock 0 No Gait/Transferring Normal/ bed rest/ wheelchair 0 Yes Weak (short steps with or without shuffle, stooped but able to lift head while walking, may seek 0 No support from furniture) Impaired (short steps with shuffle, may have difficulty arising from chair, head down, impaired 0 No balance) Mental Status Oriented to own ability 0 Yes Overestimates or forgets limitations 0 No Risk Level: Low Risk Score: 0 KHRISTINA, JANOTA ([id number]) [id number] Page 3 of 4 Electronic Signature(s) -------------------------------------------------------------------------------- Foot Assessment Details Patient Name: Date of Service: JANELI, LEWISON 10/18/2022 1:00 PM Medical Record Number: [id number] Patient Account Number: [id number] Date of Birth/Sex: Treating RN: July 29, 1970 (51 y.o. Arta Silence Primary Care Paco Cislo: Janece Canterbury Other Clinician: Referring Gracin Soohoo: Treating Hawken Bielby/Extender: Orpah Melter in Treatment: 0 Foot Assessment Items Site Locations + = Sensation present, - = Sensation absent, C = Callus, U = Ulcer R = Redness, W = Warmth, M = Maceration, PU = Pre-ulcerative lesion F = Fissure, S = Swelling, D = Dryness Assessment Right: Left: Other  Deformity: No No Prior Foot Ulcer: No No Prior Amputation: No No Charcot Joint: No No Ambulatory Status: Gait: Notes No BLE wounds. Electronic Signature(s) Signed: 10/18/2022 6:21:23 PM By: Shawn Stall RN, BSN Entered By: Shawn Stall on 10/18/2022 13:47:38 -------------------------------------------------------------------------------- Nutrition Risk Screening Details Patient Name: Date of Service: EV Melburn Popper, Dashawn 10/18/2022 1:00 PM Medical Record Number: [id number] Patient Account Number: [id number] Date of Birth/Sex: Treating RN: November 26, 1970 (52 y.o. Arta Silence Primary Care Edker Punt: Janece Canterbury Other Clinician: Referring Fleta Borgeson: Treating Josphine Laffey/Extender: Orpah Melter in Treatment: 0 Height (in): Weight (lbs): Body Mass Index (BMI): TEVIS, CONGER ([id number]) [id number] Page 4 of 4 Nutrition Risk Screening Items Score Screening NUTRITION RISK SCREEN: I have an illness or condition that made me change the kind and/or amount of food I eat 2 Yes I eat fewer than two meals per day 0 No I eat few fruits and vegetables, or milk products 0 No I have three or more drinks of beer, liquor or wine almost every day 0 No I have tooth or mouth problems that make it hard for me to eat 0 No I don't always have enough money to buy the food I need 0 No I eat alone most of the time 0 No I take three or more different prescribed or over-the-counter drugs a day 1 Yes Without wanting to, I have lost or gained 10 pounds in the last six months 0 No I am not always physically able to shop, cook and/or feed myself 0 No Nutrition Protocols Good Risk Protocol Moderate Risk Protocol 0 Provide education on nutrition High Risk Proctocol Risk Level: Moderate Risk Score: 3 Electronic Signature(s) Signed: 10/18/2022 6:21:23 PM By: Shawn Stall RN, BSN Entered By: Shawn Stall on 10/18/2022 13:47:09

## 2022-10-23 ENCOUNTER — Encounter (HOSPITAL_BASED_OUTPATIENT_CLINIC_OR_DEPARTMENT_OTHER): Payer: Medicare Other | Admitting: Internal Medicine

## 2022-10-23 DIAGNOSIS — T8131XD Disruption of external operation (surgical) wound, not elsewhere classified, subsequent encounter: Secondary | ICD-10-CM

## 2022-10-23 DIAGNOSIS — T86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: Secondary | ICD-10-CM | POA: Diagnosis not present

## 2022-10-23 DIAGNOSIS — H0270 Unspecified degenerative disorders of eyelid and periocular area: Secondary | ICD-10-CM

## 2022-10-23 NOTE — Progress Notes (Signed)
LEANNAH, MCGAUGHEY ([id number]) [id number] Page 1 of 7 Visit Report for 10/18/2022 Allergy List Details Patient Name: Date of Service: [name], [name] 10/18/2022 1:00 PM Medical Record Number: [id number] Patient Account Number: [id number] Date of Birth/Sex: Treating RN: 11/10/1970 (52 y.o. Arta Silence Primary Care Lonza Shimabukuro: Janece Canterbury Other Clinician: Referring Dekota Kirlin: Treating Andrian Sabala/Extender: Orpah Melter in Treatment: 0 Allergies Active Allergies doxycycline Levaquin Sulfa (Sulfonamide Antibiotics) Allergy Notes Electronic Signature(s) Signed: 10/18/2022 6:21:23 PM By: Shawn Stall RN, BSN Entered By: Shawn Stall on 10/17/2022 17:01:20 -------------------------------------------------------------------------------- Arrival Information Details Patient Name: Date of Service: [name], [name] 10/18/2022 1:00 PM Medical Record Number: [id number] Patient Account Number: [id number] Date of Birth/Sex: Treating RN: 01/25/1971 (52 y.o. [name], [name] Primary Care Annya Lizana: Janece Canterbury Other Clinician: Referring Azaliyah Kennard: Treating Helene Bernstein/Extender: Orpah Melter in Treatment: 0 Visit Information Patient Arrived: Ambulatory Arrival Time: 13:39 Accompanied By: brother Transfer Assistance: None Patient Identification Verified: Yes Secondary Verification Process Completed: Yes Patient Requires Transmission-Based Precautions: No Patient Has Alerts: No Electronic Signature(s) Signed: 10/18/2022 6:21:23 PM By: Shawn Stall RN, BSN Entered By: Shawn Stall on 10/18/2022 13:40:57 -------------------------------------------------------------------------------- Clinic Level of Care Assessment Details Patient Name: Date of Service: [name], [name] 10/18/2022 1:00 PM Medical Record Number: [id number] Patient Account Number: [id number] Date of Birth/Sex: Treating RN: 06-17-1970 (52 y.o. Katrinka Blazing Primary Care Azyria Osmon: Janece Canterbury Other Clinician: Referring Carrson Lightcap: Treating Guilianna Mckoy/Extender: Orpah Melter in Treatment: 0 Clinic Level of Care Assessment Items TOOL 2 Quantity Score X- 1 0 Use when only an EandM is performed on the INITIAL visit DANNA, PALOMAREZ ([id number]) [phone] Page 2 of 7 ASSESSMENTS - Nursing Assessment / Reassessment X- 1 20 General Physical Exam (combine w/ comprehensive assessment (listed just below) when performed on new pt. evals) X- 1 25 Comprehensive Assessment (HX, ROS, Risk Assessments, Wounds Hx, etc.) ASSESSMENTS - Wound and Skin A ssessment / Reassessment []  - 0 Simple Wound Assessment / Reassessment - one wound []  - 0 Complex Wound Assessment / Reassessment - multiple wounds X- 1 10 Dermatologic / Skin Assessment (not related to wound area) ASSESSMENTS - Ostomy and/or Continence Assessment and Care []  - 0 Incontinence Assessment and Management []  - 0 Ostomy Care Assessment and Management (repouching, etc.) PROCESS - Coordination of Care X - Simple Patient / Family Education for ongoing care 1 15 []  - 0 Complex (extensive) Patient / Family Education for ongoing care X- 1 10 Staff obtains Chiropractor, Records, T Results / Process Orders est X- 1 10 Staff telephones HHA, Nursing Homes / Clarify orders / etc []  - 0 Routine Transfer to another Facility (non-emergent condition) []  - 0 Routine Hospital Admission (non-emergent condition) []  - 0 New Admissions / Manufacturing engineer / Ordering NPWT Apligraf, etc. , []  - 0 Emergency Hospital Admission (emergent condition) X- 1 10 Simple Discharge Coordination []  - 0 Complex (extensive) Discharge Coordination PROCESS - Special Needs []  - 0 Pediatric / Minor Patient Management []  - 0 Isolation Patient Management []  - 0 Hearing / Language / Visual special needs []  - 0 Assessment of Community assistance  (transportation, D/C planning, etc.) X- 1 15 Additional assistance / Altered mentation []  - 0 Support Surface(s) Assessment (bed, cushion, seat, etc.) INTERVENTIONS - Wound Cleansing / Measurement []  - 0 Wound Imaging (photographs - any number of wounds) []  - 0 Wound Tracing (instead of photographs) []  - 0 Simple Wound Measurement - one wound []  - 0 Complex Wound  Measurement - multiple wounds []  - 0 Simple Wound Cleansing - one wound []  - 0 Complex Wound Cleansing - multiple wounds INTERVENTIONS - Wound Dressings []  - 0 Small Wound Dressing one or multiple wounds []  - 0 Medium Wound Dressing one or multiple wounds []  - 0 Large Wound Dressing one or multiple wounds []  - 0 Application of Medications - injection INTERVENTIONS - Miscellaneous []  - 0 External ear exam []  - 0 Specimen Collection (cultures, biopsies, blood, body fluids, etc.) []  - 0 Specimen(s) / Culture(s) sent or taken to Lab for analysis []  - 0 Patient Transfer (multiple staff / Teddy Spike / Similar devices) [name] ([id number]) [id number] Page 3 of 7 []  - 0 Simple Staple / Suture removal (25 or less) []  - 0 Complex Staple / Suture removal (26 or more) []  - 0 Hypo / Hyperglycemic Management (close monitor of Blood Glucose) []  - 0 Ankle / Brachial Index (ABI) - do not check if billed separately Has the patient been seen at the hospital within the last three years: Yes Total Score: 115 Level Of Care: New/Established - Level 3 Electronic Signature(s) Signed: 10/18/2022 6:11:45 PM By: Karie Schwalbe RN Entered By: Karie Schwalbe on 10/18/2022 18:10:10 -------------------------------------------------------------------------------- Encounter Discharge Information Details Patient Name: Date of Service: [name], [name] 10/18/2022 1:00 PM Medical Record Number: [id number] Patient Account Number: [id number] Date of Birth/Sex: Treating RN: 1971/01/27 (52 y.o. Katrinka Blazing Primary Care Saray Capasso: Janece Canterbury Other Clinician: Referring Julyanna Scholle: Treating Elspeth Blucher/Extender: Orpah Melter in Treatment: 0 Encounter Discharge Information Items Discharge Condition: Stable Ambulatory Status: Ambulatory Discharge Destination: Home Transportation: Private Auto Accompanied By: friend Schedule Follow-up Appointment: Yes Clinical Summary of Care: Patient Declined Electronic Signature(s) Signed: 10/18/2022 6:11:45 PM By: Karie Schwalbe RN Entered By: Karie Schwalbe on 10/18/2022 18:11:06 -------------------------------------------------------------------------------- Lower Extremity Assessment Details Patient Name: Date of Service: [name], [name] 10/18/2022 1:00 PM Medical Record Number: [id number] Patient Account Number: [id number] Date of Birth/Sex: Treating RN: Sep 13, 1970 (52 y.o. Arta Silence Primary Care Shya Kovatch: Janece Canterbury Other Clinician: Referring Naelani Lafrance: Treating Maritza Hosterman/Extender: Orpah Melter in Treatment: 0 Electronic Signature(s) Signed: 10/18/2022 6:21:23 PM By: Shawn Stall RN, BSN Entered By: Shawn Stall on 10/18/2022 13:47:43 -------------------------------------------------------------------------------- Multi Wound Chart Details Patient Name: Date of Service: [name], [name] 10/18/2022 1:00 PM Medical Record Number: [id number] Patient Account Number: [id number] Date of Birth/Sex: Treating RN: 02-25-1971 (52 y.o. F) Primary Care Rudra Hobbins: Janece Canterbury Other Clinician: Referring Dezirae Service: Treating Booker Bhatnagar/Extender: Orpah Melter in Treatment: 0 Vital Signs Height(in): 65 Pulse(bpm): 67 Weight(lbs): Blood Pressure(mmHg): 113/75 Body Mass Index(BMI): Temperature(F): 98 Pingree, Shanequia ([id number]) [phone].pdf Page 4 of 7 Respiratory Rate(breaths/min): 18 [1:Photos: No Photos Left Forehead Wound Location:  Surgical Injury Wounding Event: Compromised Skin Graft/Flap Primary Etiology: Asthma, Osteoarthritis Comorbid History: 10/09/2022 Date Acquired: 0 Weeks of Treatment: Open Wound Status: No Wound Recurrence: 2x1x0.1  Measurements L x W x D (cm) 1.571 A (cm) : rea 0.157 Volume (cm) : Unclassifiable Classification: None Present Exudate A mount: Distinct, outline attached Wound Margin: None Present (0%) Granulation A mount: Large (67-100%) Necrotic A mount: Eschar  Necrotic Tissue: Fascia: No Exposed Structures: Fat Layer (Subcutaneous Tissue): No Tendon: No Muscle: No Joint: No Bone: No None Epithelialization: Excoriation: No Periwound Skin Texture: Induration: No Callus: No Crepitus: No Rash: No Scarring: No  Maceration: No Periwound Skin Moisture: Dry/Scaly: No Atrophie Blanche: No Periwound Skin Color: Cyanosis: No Ecchymosis: No Erythema: No Hemosiderin Staining:  No Mottled: No Pallor: No Rubor: No sutures notes across forehead. Assessment Notes:] [N/A:N/A  N/A N/A N/A N/A N/A N/A N/A N/A N/A N/A N/A N/A N/A N/A N/A N/A N/A N/A N/A N/A N/A N/A N/A] Treatment Notes Electronic Signature(s) Signed: 10/22/2022 6:04:41 PM By: Baltazar Najjar MD Entered By: Baltazar Najjar on 10/22/2022 17:58:54 -------------------------------------------------------------------------------- Multi-Disciplinary Care Plan Details Patient Name: Date of Service: [name], Ailsa 10/18/2022 1:00 PM Medical Record Number: [id number] Patient Account Number: [id number] Date of Birth/Sex: Treating RN: 1970-07-22 (52 y.o. Katrinka Blazing Primary Care Aditri Louischarles: Janece Canterbury Other Clinician: Referring Renee Beale: Treating Hazelgrace Bonham/Extender: Orpah Melter in Treatment: 0 Active Inactive HBO Nursing Diagnoses: Potential for oxygen toxicity seizures related to delivery of 100% oxygen at an increased atmospheric pressure Goals: Patient and/or family will be able to state/discuss factors appropriate to the  management of their disease process during treatment Date Initiated: 10/18/2022 Target Resolution Date: 01/12/2023 Goal Status: Active [name], [name] ([id number]) [id number] Page 5 of 7 Interventions: Assess for signs and symptoms related to adverse events, including but not limited to confinement anxiety, pneumothorax, oxygen toxicity and baurotrauma Notes: Electronic Signature(s) Signed: 10/18/2022 6:11:45 PM By: Karie Schwalbe RN Entered By: Karie Schwalbe on 10/18/2022 18:06:31 -------------------------------------------------------------------------------- Pain Assessment Details Patient Name: Date of Service: [name], [name] 10/18/2022 1:00 PM Medical Record Number: [id number] Patient Account Number: [id number] Date of Birth/Sex: Treating RN: April 03, 1971 (52 y.o. Arta Silence Primary Care Cyril Railey: Janece Canterbury Other Clinician: Referring Loran Auguste: Treating Tyjuan Demetro/Extender: Orpah Melter in Treatment: 0 Active Problems Location of Pain Severity and Description of Pain Patient Has Paino Yes Site Locations Pain Location: Generalized Pain, Pain in Ulcers Rate the pain. Current Pain Level: 9 Tolerable Pain Level: 8 Pain Management and Medication Current Pain Management: Medication: No Cold Application: No Rest: No Massage: No Activity: No T.E.N.S.: No Heat Application: No Leg drop or elevation: No Is the Current Pain Management Adequate: Adequate How does your wound impact your activities of daily livingo Sleep: No Bathing: No Appetite: No Relationship With Others: No Bladder Continence: No Emotions: No Bowel Continence: No Work: No Toileting: No Drive: No Dressing: No Hobbies: No Psychologist, prison and probation services) Signed: 10/18/2022 6:21:23 PM By: Shawn Stall RN, BSN Entered By: Shawn Stall on 10/18/2022 13:47:29 -------------------------------------------------------------------------------- Patient/Caregiver  Education Details Patient Name: Date of Service: [name] 6/6/2024andnbsp1:00 PM Medical Record Number: [id number] Patient Account Number: [id number] Date of Birth/Gender: Treating RN: 1970-12-13 (52 y.o. Katrinka Blazing Primary Care Physician: Janece Canterbury Other Clinician: [name] ([id number]) [id number] Page 6 of 7 Referring Physician: Treating Physician/Extender: Orpah Melter in Treatment: 0 Education Assessment Education Provided To: Patient Education Topics Provided Wound/Skin Impairment: Methods: Explain/Verbal Responses: Return demonstration correctly Electronic Signature(s) Signed: 10/18/2022 6:11:45 PM By: Karie Schwalbe RN Entered By: Karie Schwalbe on 10/18/2022 18:06:42 -------------------------------------------------------------------------------- Wound Assessment Details Patient Name: Date of Service: [name], [name] 10/18/2022 1:00 PM Medical Record Number: [id number] Patient Account Number: [id number] Date of Birth/Sex: Treating RN: 1970/10/24 (52 y.o. [name], [name] Primary Care Naimah Yingst: Janece Canterbury Other Clinician: Referring Jaydeen Odor: Treating Shifa Brisbon/Extender: Orpah Melter in Treatment: 0 Wound Status Wound Number: 1 Primary Etiology: Compromised Skin Graft/Flap Wound Location: Left Forehead Wound Status: Open Wounding Event: Surgical Injury Comorbid History: Asthma, Osteoarthritis Date Acquired: 10/09/2022 Weeks Of Treatment: 0 Clustered Wound: No Photos Wound Measurements Length: (cm) 2 Width: (cm) 1 Depth: (cm) 0.1 Area: (cm) 1.571 Volume: (cm) 0.157 % Reduction  in Area: % Reduction in Volume: Epithelialization: None Tunneling: No Undermining: No Wound Description Classification: Unclassifiable Wound Margin: Distinct, outline attached Exudate Amount: None Present Foul Odor After Cleansing: No Slough/Fibrino No Wound Bed Granulation  Amount: None Present (0%) Exposed Structure Necrotic Amount: Large (67-100%) Fascia Exposed: No Necrotic Quality: Eschar Fat Layer (Subcutaneous Tissue) Exposed: No Tendon Exposed: No Muscle Exposed: No Joint Exposed: No [name], [name] ([id number]) [id number] Page 7 of 7 Bone Exposed: No Periwound Skin Texture Texture Color No Abnormalities Noted: No No Abnormalities Noted: No Callus: No Atrophie Blanche: No Crepitus: No Cyanosis: No Excoriation: No Ecchymosis: No Induration: No Erythema: No Rash: No Hemosiderin Staining: No Scarring: No Mottled: No Pallor: No Moisture Rubor: No No Abnormalities Noted: No Dry / Scaly: No Maceration: No Assessment Notes sutures notes across forehead. Electronic Signature(s) Signed: 10/23/2022 9:14:28 AM By: Shawn Stall RN, BSN Previous Signature: 10/22/2022 6:00:35 PM Version By: Shawn Stall RN, BSN Entered By: Shawn Stall on 10/23/2022 09:13:40 -------------------------------------------------------------------------------- Vitals Details Patient Name: Date of Service: [name], [name] 10/18/2022 1:00 PM Medical Record Number: [id number] Patient Account Number: [id number] Date of Birth/Sex: Treating RN: 1970-07-22 (52 y.o. Arta Silence Primary Care Amman Bartel: Janece Canterbury Other Clinician: Referring Sian Joles: Treating Taiden Raybourn/Extender: Orpah Melter in Treatment: 0 Vital Signs Time Taken: 13:40 Temperature (F): 98 Height (in): 65 Pulse (bpm): 67 Source: Stated Respiratory Rate (breaths/min): 18 Source: Stated Blood Pressure (mmHg): 113/75 Reference Range: 80 - 120 mg / dl Electronic Signature(s) Signed: 10/18/2022 6:21:23 PM By: Shawn Stall RN, BSN Entered By: Shawn Stall on 10/18/2022 13:44:09

## 2022-10-23 NOTE — Progress Notes (Addendum)
[name], [name] ([id number]) [id number] Page 1 of 8 Visit Report for 10/18/2022 Chief Complaint Document Details Patient Name: Date of Service: [name], [name] 10/18/2022 1:00 PM Medical Record Number: [id number] Patient Account Number: [id number] Date of Birth/Sex: Treating RN: 1970/11/01 (52 y.o. F) Primary Care Provider: Janece Canterbury Other Clinician: Referring Provider: Treating Provider/Extender: Orpah Melter in Treatment: 0 Information Obtained from: Patient Chief Complaint 10/18/2022; patient was referred here by plastic surgery for consideration of hyperbaric oxygen for flap preservation after surgery on 10/09/2022. Electronic Signature(s) Signed: 10/19/2022 7:57:57 AM By: Baltazar Najjar MD Entered By: Baltazar Najjar on 10/18/2022 15:09:43 -------------------------------------------------------------------------------- HPI Details Patient Name: Date of Service: [name], [name] 10/18/2022 1:00 PM Medical Record Number: [id number] Patient Account Number: [id number] Date of Birth/Sex: Treating RN: March 13, 1971 (52 y.o. F) Primary Care Provider: Janece Canterbury Other Clinician: Referring Provider: Treating Provider/Extender: Orpah Melter in Treatment: 0 History of Present Illness HPI Description: 10/18/2022 ADMISSION This is a 52 year old woman who had progressive problems with ptosis bilaterally. On 5/28 she underwent a trichophytic brow ptosis repair and upper blepharoplasty. The patient tells me that she actually did really well until about day 4. She then developed an area of necrosis on the left part of the flap. She is also developed pain along the incision line and ballotable fluid on the right edge of the surgical line/flap. She is taking Vicodin for the pain putting topical mupirocin on the incision line. She was on warm compresses as well. She was on Augmentin but I believe she is finished this. She  does not complain of any visual disturbances. No fever. She has been referred here by plastic surgery for consideration of hyperbaric oxygen for flap necrosis/failed flap Past medical history includes multiple sclerosis, hypothyroidism, cervical and lumbar spondylosis and hypertension Electronic Signature(s) Signed: 10/23/2022 10:22:06 AM By: Baltazar Najjar MD Previous Signature: 10/19/2022 7:57:57 AM Version By: Baltazar Najjar MD Entered By: Baltazar Najjar on 10/23/2022 10:10:59 Physical Exam Details -------------------------------------------------------------------------------- [name] ([id number]) [id number] Page 2 of 8 Patient Name: Date of Service: [name], [name] 10/18/2022 1:00 PM Medical Record Number: [id number] Patient Account Number: [id number] Date of Birth/Sex: Treating RN: [birth year] (52 y.o. F) Primary Care Provider: Janece Canterbury Other Clinician: Referring Provider: Treating Provider/Extender: Orpah Melter in Treatment: 0 Constitutional Sitting or standing Blood Pressure is within target range for patient.. Pulse regular and within target range for patient.Marland Kitchen Respirations regular, non-labored and within target range.. Temperature is normal and within the target range for the patient.Marland Kitchen Appears in no distress but anxious and uncomfortable. Ears, Nose, Mouth, and Throat She has some bruising on the upper eyelids and some swelling periorbitally but nothing looks significant here.Marland Kitchen Respiratory work of breathing is normal. Bilateral breath sounds are clear and equal in all lobes with no wheezes, rales or rhonchi.. Cardiovascular Heart rhythm and rate regular, without murmur or gallop.. Notes Wound exam; the surgical incision is at her hairline at the forehead. Sutures are still intact. There is a small area of necrosis on the left part of the wound extendingdistally.She shows me pictures documenting the necrosis of  this area since Sunday. On the right part of the flap there is I think ballotable fluid and tenderness. There is no erythema. The rest of the incision appears somewhat dusky but not acutely threatened. There is no evidence of infection Electronic Signature(s) Signed: 10/23/2022 10:22:06 AM By: Baltazar Najjar MD Previous Signature: 10/19/2022 7:57:57 AM Version By: Leanord Hawking  Casimiro Needle MD Entered By: Baltazar Najjar on 10/23/2022 10:11:49 -------------------------------------------------------------------------------- Physician Orders Details Patient Name: Date of Service: [name], [name] 10/18/2022 1:00 PM Medical Record Number: [id number] Patient Account Number: [id number] Date of Birth/Sex: Treating RN: [birth year] (52 y.o. 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 - Dr Ronnald Collum. Leanord Hawking Return A +++HBO Evaluation++++ Hyperbaric Oxygen Therapy Wound #1 Left Forehead Evaluate for HBO Therapy - compromise for a failed flap 2.5 ATA for 90 Minutes with 2 Five (5) Minute A Breaks ir Total Number of Treatments: - 20 One treatments per day (delivered Monday through Friday unless otherwise specified in Special Instructions below): Wound Treatment Consults 12-Lead EKG - 12-Lead EKG for Hyperbaric Oxygen Therapy. - (ICD10 [id number] - Skin graft (allograft) (autograft) failure) Radiology [name] ([id number]) [id number] Page 3 of 8 PA and Lateral Chest X-Ray - For Hyperbaric Oxygen  Therapy - (ICD10 T86.821 - Skin graft (allograft) (autograft) failure) Electronic Signature(s) Signed: 10/23/2022 10:18:17 AM By: Baltazar Najjar MD Previous Signature: 10/22/2022 6:00:35 PM Version By: Shawn Stall RN, BSN Previous Signature: 10/22/2022 6:04:41 PM Version By: Baltazar Najjar MD Previous Signature: 10/18/2022 3:18:15 PM Version By: Baltazar Najjar MD Entered By: Baltazar Najjar on 10/23/2022 10:18:17 Prescription 10/18/2022 -------------------------------------------------------------------------------- Langston Masker MD Patient Name: Provider: 03-22-1971 [id number] Date of Birth: NPI#: [id number] Sex: DEA #: [phone] Phone #: License #: [id number] UPN: Patient Address: [address] [facility] [place], [place] [address] [address] [place], [place] [address] [phone] Allergies doxycycline; Levaquin; Sulfa (Sulfonamide Antibiotics) Provider's Orders PA and Lateral Chest X-Ray - ICD10: T86.821 - For Hyperbaric Oxygen Therapy Hand Signature: Date(s): Prescription 10/18/2022 Langston Masker MD Patient Name: Provider: 06/25/1970 [id number] Date of Birth: NPI#: [id number] Sex: DEA #: [phone] Phone #: License #: [id number] UPN: Patient Address: [address] [facility] [place], [place] [address] [address] [place], [place] [address] [phone] Allergies doxycycline; Levaquin; Sulfa (Sulfonamide Antibiotics) Provider's Orders 12-Lead EKG - ICD10: T86.821 - 12-Lead EKG for Hyperbaric Oxygen Therapy. Hand Signature: Date(s): Electronic Signature(s) Signed: 10/23/2022 10:22:06 AM By: Baltazar Najjar MD Previous Signature: 10/22/2022 6:00:35 PM Version By: Shawn Stall RN, BSN Previous Signature: 10/22/2022 6:04:41 PM Version By: Baltazar Najjar MD Previous Signature: 10/19/2022 7:57:57 AM Version  By: Baltazar Najjar MD Entered By: Baltazar Najjar on 10/23/2022 10:18:17 [name] ([id number]) [id number] Page 4 of 8 -------------------------------------------------------------------------------- Problem List Details Patient Name: Date of Service: [name], [name] 10/18/2022 1:00 PM Medical Record Number: [id number] Patient Account Number: [id number] Date of Birth/Sex: Treating RN: [birth year] (52 y.o. 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 F) Primary Care Provider: Janece Canterbury Other Clinician: Referring Provider: Treating Provider/Extender: Orpah Melter in Treatment: 0 Subjective Chief Complaint Information obtained from Patient 10/18/2022; patient was referred here by plastic surgery for consideration of hyperbaric oxygen for flap preservation after surgery on 10/09/2022. History of Present Illness (HPI) 10/18/2022 ADMISSION This is a 52 year old woman who had progressive problems with ptosis  bilaterally. On 5/28 she underwent a trichophytic brow ptosis repair and upper blepharoplasty. The patient tells me that she actually did really well until about day 4. She then developed an area of necrosis on the left part of the surgical incision. She is also developed pain along the incision line and ballotable fluid on the right edge of the incision. She is taking Vicodin for the pain putting topical mupirocin on the incision line. She was on warm compresses as well. She was on Augmentin but I believe she is finished this. She does not complain of any visual disturbances. No fever. She has been referred here by plastic surgery for consideration of hyperbaric oxygen for flap necrosis. BRITA, PETRUSKA ([id number]) [id number] Page 5 of 8 Past medical history includes multiple sclerosis, hypothyroidism, cervical and lumbar spondylosis and hypertension Patient History Allergies doxycycline, Levaquin, Sulfa (Sulfonamide Antibiotics) Family History No family history of Cancer, Diabetes, Heart Disease, Hereditary Spherocytosis, Hypertension, Kidney Disease, Lung Disease, Seizures, Stroke, Thyroid Problems, Tuberculosis. Social History Former smoker - quit - ended on 08/28/2022, Marital Status - Divorced, Alcohol Use - Never, Drug Use - No History. Medical History Respiratory Patient has history of Asthma Musculoskeletal Patient has history of Osteoarthritis Hospitalization/Surgery History - left knee surgery x2 2019 and 2019. - hernia repair. - cholecystectomy. - ORIF left radial. - carpel tunnel release. - rotator cuff repair. - 10/09/2022 bilateral upper eyelids ptosis, trichophytic brow ptosis repair and upper blepharophasty. Medical A Surgical History Notes nd Constitutional Symptoms (General Health) hypothyroidism Musculoskeletal MS fibromyalgia neuromuscular disorder Psychiatric Depression Objective Constitutional Sitting or standing Blood Pressure is  within target range for patient.. Pulse regular and within target range for patient.Marland Kitchen Respirations regular, non-labored and within target range.. Temperature is normal and within the target range for the patient.Marland Kitchen Appears in no distress but anxious and uncomfortable. Vitals Time Taken: 1:40 PM, Height: 65 in, Source: Stated, Source: Stated, Temperature: 98 F, Pulse: 67 bpm, Respiratory Rate: 18 breaths/min, Blood Pressure: 113/75 mmHg. Ears, Nose, Mouth, and Throat She has some bruising on the upper eyelids and some swelling periorbitally but nothing looks significant here.Marland Kitchen Respiratory work of breathing is normal. Bilateral breath sounds are clear and equal in all lobes with no wheezes, rales or rhonchi.. Cardiovascular Heart rhythm and rate regular, without murmur or gallop.. General Notes: Wound exam; the surgical incision is at her hairline at the forehead. Sutures are still intact. There is a small area of necrosis on the left part of the wound extendingdistally.She shows me pictures documenting the necrosis of this area since Sunday. On the right part of the wound there is I think ballotable fluid and tenderness. There is no erythema. The rest of the incision appears somewhat dusky but not acutely threatened. There is no evidence of infection Integumentary (Hair, Skin) Wound #1 status is Open. Original cause of wound was Surgical Injury. The date acquired was: 10/09/2022. The wound is  located on the Left Forehead. The wound measures 2cm length x 1cm width x 0.1cm depth; 1.571cm^2 area and 0.157cm^3 volume. There is no tunneling or undermining noted. There is a none present amount of drainage noted. The wound margin is distinct with the outline attached to the wound base. There is no granulation within the wound bed. There is a large (67-100%) amount of necrotic tissue within the wound bed including Eschar. The periwound skin appearance did not exhibit: Callus, Crepitus, Excoriation,  Induration, Rash, Scarring, Dry/Scaly, Maceration, Atrophie Blanche, Cyanosis, Ecchymosis, Hemosiderin Staining, Mottled, Pallor, Rubor, Erythema. General Notes: sutures notes across forehead. 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 Also equally worrisome is on the right side of the incision edema and palpable tenderness although there is no necrosis in this area. The entire incision line looks somewhat dusky however there are no other openings. Sutures are still in place due to her be removed on 5/13 Description of treatment once graft failure identified The patient has been using topical antibiotics and for the swelling was changed to cold compresses. She has completed her antibiotics. Nevertheless her discomfort has not improved Plan of care/Summary 1. The patient will continue to follow with her plastic surgeons with an appointment early next week. I think topical Bactroban to the surgical incision line is reasonable. This will combat infection as well as keep the incision line moist. 2. We have ordered 20 treatments of hyperbarics 2.5 atm with two 5-minute air breaks. Hopefully 20 treatments will be not be necessary to affect improvement in the surgical area 3. Flap failure in this area would result in no doubt further plastic surgery and perhaps leave a disfiguring  result in this patient's forehead. We will try to initiate HBO ASAP. 4. We will be carefully observing the incision line and the small area of necrosis on the left. Also careful attention to the right side of the surgical wound which was surprisingly tender today although there was no necrosis no erythema no evidence of infection Plan Follow-up Appointments: Return Appointment in 1 week. - Dr Ronnald Collum. Leanord Hawking +++HBO Evaluation++++ Hyperbaric Oxygen Therapy: Wound #1 Left Forehead: Evaluate for HBO Therapy - compromise for a failed flap 2.5 ATA for 90 Minutes with 2 Five (5) Minute Air Breaks T Number of Treatments: - 20 otal One treatments per day (delivered Monday through Friday unless otherwise specified in Special Instructions below): Radiology ordered were: PA and Lateral Chest X-Ray - For Hyperbaric Oxygen Therapy Consults ordered were: 12-Lead EKG - 12-Lead EKG for Hyperbaric Oxygen Therapy. 1. I agree that tissue on the incision line of the flap closure appears somewhat threatened. I think the patient would benefit from HBO for imminent flap failure. I agree with the assessment of her surgeons 2 the areas that appear worrisome are on the left lateral part of this incision which with a small area of necrosis. I am also concerned about the right part of this incision which appears to be somewhat boggy and tender although not overtly infected.  3. We have put in orders for hyperbaric oxygen 2.5 atm for 20 treatments with two 5-minute air breaks. The patient has a history of some claustrophobia but she thinks she will be able to get through this. Notable that she has Xanax as needed at home for anxiety. 20 treatments ordered but hopefully she will not require that many 4. She does not have any cardiopulmonary issues but she will require a chest x-ray and EKG. Electronic Signature(s) Signed: 11/12/2022 4:43:29 PM By: Shawn Stall RN, BSN Signed: 11/13/2022 3:35:02 PM By: Baltazar Najjar  MD Previous Signature: 10/23/2022 10:18:41 AM Version By: Baltazar Najjar MD Previous Signature: 10/23/2022 10:13:10 AM Version By: Baltazar Najjar MD Previous Signature: 10/22/2022 6:01:55 PM Version By: Baltazar Najjar MD Previous Signature: 10/22/2022 6:00:35 PM Version By: Shawn Stall RN, BSN Previous Signature: 10/19/2022 12:43:16 PM Version By: Baltazar Najjar MD Previous Signature: 10/19/2022 7:57:57 AM Version By: Baltazar Najjar MD Entered By: Shawn Stall on 11/12/2022 16:43:29 [name] ([id number]) [id number] Page 7 of 8 -------------------------------------------------------------------------------- HxROS Details Patient Name: Date of Service: [name], [name] 10/18/2022 1:00 PM Medical Record Number: [id number] Patient Account Number: [id number] Date of Birth/Sex: Treating RN: Apr 28, 1971 (52 y.o. [name], [name] Primary Care Provider: Janece Canterbury Other Clinician: Referring Provider: Treating Provider/Extender: Orpah Melter in Treatment: 0 Constitutional Symptoms (General Health) Medical History: Past Medical History Notes: hypothyroidism Respiratory Medical History: Positive for: Asthma Musculoskeletal Medical History: Positive for: Osteoarthritis Past Medical History Notes: MS fibromyalgia neuromuscular disorder Psychiatric Medical History: Past Medical History Notes: Depression Immunizations Pneumococcal Vaccine: Received Pneumococcal Vaccination: No Implantable Devices No devices added Hospitalization / Surgery History Type of Hospitalization/Surgery left knee surgery x2 2019 and 2019 hernia repair cholecystectomy ORIF left radial carpel tunnel release rotator cuff repair 10/09/2022 bilateral upper eyelids ptosis, trichophytic brow ptosis repair and upper blepharophasty Family and Social History Cancer: No; Diabetes: No; Heart Disease: No; Hereditary Spherocytosis: No; Hypertension: No;  Kidney Disease: No; Lung Disease: No; Seizures: No; Stroke: No; Thyroid Problems: No; Tuberculosis: No; Former smoker - quit - ended on 08/28/2022; Marital Status - Divorced; Alcohol Use: Never; Drug Use: No History; Financial Concerns: No; Food, Clothing or Shelter Needs: No; Support System Lacking: No; Transportation Concerns: No Psychologist, prison and probation services) Signed: 10/18/2022 6:21:23 PM By: Shawn Stall RN, BSN Signed: 10/19/2022 7:57:57 AM By: Baltazar Najjar MD Entered By: Shawn Stall on 10/17/2022 17:12:28 [name] ([id number]) [id number] Page 8 of 8 -------------------------------------------------------------------------------- SuperBill Details Patient Name: Date of Service: TAMLYN, PACITTI 10/18/2022 Medical Record Number: [id number] Patient Account Number: [id number] Date of Birth/Sex: Treating RN: 1970/05/25 (52 y.o. F) Primary Care Provider: Janece Canterbury Other Clinician: Referring Provider: Treating Provider/Extender: Orpah Melter in Treatment: 0 Diagnosis Coding ICD-10 Codes Code Description [phone] Skin graft (allograft) (autograft) failure T81.31XD Disruption of external operation (surgical) wound, not elsewhere classified, subsequent encounter 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 Facility Procedures : CPT4 Code: [id number] Description: 99213 - WOUND CARE VISIT-LEV 3 EST PT Modifier: Quantity: 1 Physician Procedures : CPT4 Code Description Modifier [id number] WC PHYS LEVEL 3 NEW PT ICD-10 Diagnosis Description 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 Disruption of external operation (surgical) wound, not elsewhere classified, subsequent encounter  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

## 2022-10-24 ENCOUNTER — Encounter (HOSPITAL_BASED_OUTPATIENT_CLINIC_OR_DEPARTMENT_OTHER): Payer: Medicare Other | Attending: Physician Assistant | Admitting: Physician Assistant

## 2022-10-24 DIAGNOSIS — T86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: Secondary | ICD-10-CM | POA: Insufficient documentation

## 2022-10-24 DIAGNOSIS — X58XXXA Exposure to other specified factors, initial encounter: Secondary | ICD-10-CM | POA: Insufficient documentation

## 2022-10-24 DIAGNOSIS — T8131XA Disruption of external operation (surgical) wound, not elsewhere classified, initial encounter: Secondary | ICD-10-CM | POA: Insufficient documentation

## 2022-10-24 NOTE — Progress Notes (Addendum)
[name], [name] ([id number]) [id number] Page 1 of 2 Visit Report for 10/23/2022 HBO Details Patient Name: Date of Service: [name], [name] 10/23/2022 1:00 PM Medical Record Number: [id number] Patient Account Number: [id number] Date of Birth/Sex: Treating RN: [birth year] (52 y.o. 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 HBO Treatment Details Treatment Number: 1 Patient Type: Outpatient Chamber Type: Monoplace Chamber Serial #: [id number] Treatment Protocol: 2.0 ATA with 90 minutes oxygen, with two 5 minute air breaks Treatment Details Compression Rate Down: 1.0 psi / minute De-Compression Rate Up: 1.0 psi / minute A breaks and breathing ir Compress Tx Pressure periods Decompress Decompress Begins Reached (leave unused spaces Begins Ends blank) Chamber Pressure (ATA 1 2 2 2 2 2  --2 1 ) Clock Time (24 hr) 14:21 14:29 15:02 15:04 15:34 15:39 - - 16:09 16:18 Treatment Length: 117 (minutes) Treatment Segments: 4 Vital Signs Capillary Blood Glucose Reference Range: 80 - 120 mg / dl HBO Diabetic Blood Glucose Intervention Range: <131 mg/dl or >308 mg/dl Type: Time Vitals Blood Pulse: Respiratory Temperature: Capillary Blood Glucose Pulse Action Taken: Pressure: Rate: Glucose (mg/dl): Meter #: Oximetry (%) Taken: Pre 13:49 95/61 76 16 97.5 Dr. Mikey Bussing informed of Blood pressure. Post 16:25 100/63 58 18 97 Treatment Response Treatment Toleration: Well Treatment Completion Status: Treatment Completed without Adverse Event Treatment Notes Today was the patient first treatment. She was seen  at chamber side by Dr. Mikey Bussing before the treatment was started. The patient had no problems noted during the treatment on compression or decompression. Delay in the first air break due to the mask tubing fell out. The patient was able to reattach tubing. The air break was started after that. No other problems noted. [name] Notes Evaluated patient prior to her first hyperbaric oxygen therapy treatment. Evaluated ears and tympanic membranes were visualized and clear. She denied any issues breathing or chest pain. Physician HBO Attestation: I certify that I supervised this HBO treatment in accordance with Medicare guidelines. A trained emergency response team is readily available per Yes hospital policies and procedures. Continue HBOT as ordered. Yes Electronic Signature(s) Signed: 10/24/2022 2:12:00 PM By: Geralyn Corwin DO Previous Signature: 10/23/2022 4:51:57 PM Version By: Karl Bales EMT Entered By: Geralyn Corwin on 10/24/2022 13:57:50 HBO Safety Checklist Details -------------------------------------------------------------------------------- [name] ([id number]) [id number] Page 2 of 2 Patient Name: Date of Service: [name], CRITES 10/23/2022 1:00 PM Medical Record Number: [id number] Patient Account Number: [id number] Date of Birth/Sex: Treating RN: 1970/05/21 (52 y.o. [name] Primary Care Claron Rosencrans: [name] Other Clinician: Karl Bales Referring Emri Sample: Treating Timiko Offutt/Extender: Luan Pulling in Treatment: 0 HBO Safety Checklist Items Safety Checklist Consent Form Signed Patient voided / foley secured and emptied When did you last eato 1230 Last dose of injectable or oral agent NA Ostomy pouch emptied and vented if applicable NA All implantable devices assessed, documented and approved NA Intravenous access site secured and place NA Valuables secured Linens and cotton and  cotton/polyester blend (less than 51% polyester) Personal oil-based products / skin lotions / body lotions removed Wigs or hairpieces removed NA Smoking or tobacco materials removed Books / newspapers / magazines / loose paper removed Cologne, aftershave, perfume and deodorant removed Jewelry removed (may wrap  wedding band) Make-up removed Hair care products removed removed Battery operated devices (external) removed Heating patches and chemical warmers removed Titanium eyewear removed NA Nail polish cured greater than 10 hours NA Casting material cured greater than 10 hours NA Hearing aids removed NA Loose dentures or partials removed NA Prosthetics have been removed NA Patient demonstrates correct use of air break device (if applicable) Patient concerns have been addressed Patient grounding bracelet on and cord attached to chamber Specifics for Inpatients (complete in addition to above) Medication sheet sent with patient NA Intravenous medications needed or due during therapy sent with patient NA Drainage tubes (e.g. nasogastric tube or chest tube secured and vented) NA Endotracheal or Tracheotomy tube secured NA Cuff deflated of air and inflated with saline NA Airway suctioned NA Notes The safety checklist wads dine before the treatment was started. Electronic Signature(s) Signed: 10/23/2022 4:44:36 PM By: Karl Bales EMT Entered By: Karl Bales on 10/23/2022 16:44:36

## 2022-10-24 NOTE — Progress Notes (Signed)
YEXALEN, DEIKE ([id number]) [id number] Page 1 of 2 Visit Report for 10/23/2022 Arrival Information Details Patient Name: Date of Service: [name], [name] 10/23/2022 1:00 PM Medical Record Number: [id number] Patient Account Number: [id number] Date of Birth/Sex: Treating RN: [birth year] (52 y.o. [name] Primary Care [name] Other Clinician: Karl [name] Referring [name]: Treating [name]/Extender: [name] in Treatment: 0 Visit Information History Since Last Visit All ordered tests and consults were completed: Yes Patient Arrived: Ambulatory Added or deleted any medications: No Arrival Time: 13:04 Any new allergies or adverse reactions: No Accompanied By: Friend Had a fall or experienced change in No Transfer Assistance: None activities of daily living that may affect Patient Identification Verified: Yes risk of falls: Secondary Verification Process Completed: Yes Signs or symptoms of abuse/neglect since last visito No Patient Requires Transmission-Based Precautions: No Hospitalized since last visit: No Patient Has Alerts: No Implantable device outside of the clinic excluding No cellular tissue based products placed in the center since last visit: Pain Present Now: No Electronic Signature(s) Signed: 10/23/2022 4:41:09 PM By: [name] EMT Entered By: [name] on 10/23/2022 16:41:09 -------------------------------------------------------------------------------- Encounter Discharge Information Details Patient Name: Date of Service: [name], [name] 10/23/2022 1:00 PM Medical Record Number: [id number] Patient Account Number: [id number] Date of Birth/Sex: Treating RN: 17-Jan-1971 (52 y.o. [name] Primary Care [name] Other Clinician: Karl [name] Referring Zigmund Linse: Treating Jobani Sabado/Extender: [name] in  Treatment: 0 Encounter Discharge Information Items Discharge Condition: Stable Ambulatory Status: Ambulatory Discharge Destination: Home Transportation: Private Auto Accompanied By: [name] Schedule Follow-up Appointment: Yes Clinical Summary of Care: Electronic Signature(s) Signed: 10/23/2022 4:53:48 PM By: [name] EMT Entered By: [name] on 10/23/2022 16:53:48 -------------------------------------------------------------------------------- Vitals Details Patient Name: Date of Service: [name], [name] 10/23/2022 1:00 PM Medical Record Number: [id number] Patient Account Number: [id number] Date of Birth/Sex: Treating RN: 1970-09-27 (52 y.o. [name] Primary Care [name] Other Clinician: Karl [name] Referring [name]: Treating [name]/Extender: [name] in Treatment: 0 Vital Signs Time Taken: 13:49 Temperature (F): 97.5 [name], [name] ([id number]) [id number] Page 2 of 2 Height (in): 65 Pulse (bpm): 76 Respiratory Rate (breaths/min): 16 Blood Pressure (mmHg): 95/61 Reference Range: 80 - 120 mg / dl Electronic Signature(s) Signed: 10/23/2022 4:41:47 PM By: [name] EMT Entered By: [name] on 10/23/2022 16:41:47

## 2022-10-25 ENCOUNTER — Encounter (HOSPITAL_BASED_OUTPATIENT_CLINIC_OR_DEPARTMENT_OTHER): Payer: Medicare Other | Admitting: Internal Medicine

## 2022-10-25 DIAGNOSIS — T8131XD Disruption of external operation (surgical) wound, not elsewhere classified, subsequent encounter: Secondary | ICD-10-CM | POA: Diagnosis not present

## 2022-10-25 DIAGNOSIS — H0270 Unspecified degenerative disorders of eyelid and periocular area: Secondary | ICD-10-CM | POA: Diagnosis not present

## 2022-10-25 DIAGNOSIS — T86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: Secondary | ICD-10-CM | POA: Diagnosis not present

## 2022-10-25 NOTE — Progress Notes (Addendum)
SUSI, BRANDE ([id number]) [id number] Page 1 of 2 Visit Report for 10/24/2022 Arrival Information Details Patient Name: Date of Service: [name], [name] 10/24/2022 1:00 PM Medical Record Number: [id number] Patient Account Number: [id number] Date of Birth/Sex: Treating RN: 04/17/1971 (52 y.o. Katrinka Blazing Primary Care Khushbu Pippen: Janece Canterbury Other Clinician: Karl Bales Referring Amandalee Lacap: Treating Annisa Mazzarella/Extender: Oswaldo Conroy in Treatment: 0 Visit Information History Since Last Visit All ordered tests and consults were completed: Yes Patient Arrived: Ambulatory Added or deleted any medications: No Arrival Time: 11:24 Any new allergies or adverse reactions: No Accompanied By: Brother Had a fall or experienced change in No Transfer Assistance: None activities of daily living that may affect Patient Identification Verified: Yes risk of falls: Secondary Verification Process Completed: Yes Signs or symptoms of abuse/neglect since last visito No Patient Requires Transmission-Based Precautions: No Hospitalized since last visit: No Patient Has Alerts: No Implantable device outside of the clinic excluding No cellular tissue based products placed in the center since last visit: Pain Present Now: No Electronic Signature(s) Signed: 10/24/2022 3:38:47 PM By: Karl Bales EMT Entered By: Karl Bales on 10/24/2022 15:38:47 -------------------------------------------------------------------------------- Encounter Discharge Information Details Patient Name: Date of Service: [name], [name] 10/24/2022 1:00 PM Medical Record Number: [id number] Patient Account Number: [id number] Date of Birth/Sex: Treating RN: Jul 03, 1970 (52 y.o. Katrinka Blazing Primary Care Ahuva Poynor: Janece Canterbury Other Clinician: Karl Bales Referring Lebaron Bautch: Treating Ceaira Ernster/Extender: Oswaldo Conroy in Treatment:  0 Encounter Discharge Information Items Discharge Condition: Stable Ambulatory Status: Ambulatory Discharge Destination: Home Transportation: Private Auto Accompanied By: Brother Schedule Follow-up Appointment: Yes Clinical Summary of Care: Electronic Signature(s) Signed: 10/24/2022 3:44:40 PM By: Karl Bales EMT Entered By: Karl Bales on 10/24/2022 15:44:40 [name] ([id number]) [id number] Page 2 of 2 -------------------------------------------------------------------------------- Vitals Details Patient Name: Date of Service: [name], [name] 10/24/2022 1:00 PM Medical Record Number: [id number] Patient Account Number: [id number] Date of Birth/Sex: Treating RN: 08/05/1970 (52 y.o. Katrinka Blazing Primary Care Annalysia Willenbring: Janece Canterbury Other Clinician: Karl Bales Referring Mona Ayars: Treating Quest Tavenner/Extender: Oswaldo Conroy in Treatment: 0 Vital Signs Time Taken: 11:45 Temperature (F): 98.4 Height (in): 65 Pulse (bpm): 84 Respiratory Rate (breaths/min): 18 Blood Pressure (mmHg): 112/78 Reference Range: 80 - 120 mg / dl Airway Pulse Oximetry (%): 95 Electronic Signature(s) Signed: 10/30/2022 2:46:50 PM By: Karl Bales EMT Entered By: Karl Bales on 10/24/2022 15:39:46

## 2022-10-25 NOTE — Progress Notes (Signed)
[name], [name] ([id number]) [id number] Page 1 of 2 Visit Report for 10/23/2022 Physician Orders Details Patient Name: Date of Service: [name], [name] 10/23/2022 1:00 PM Medical Record Number: [id number] Patient Account Number: [id number] Date of Birth/Sex: Treating RN: 12-Nov-1970 (52 y.o. 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 - Dr Ronnald Collum. Leanord Hawking Return A +++HBO Evaluation++++ Hyperbaric Oxygen Therapy Wound #1 Left Forehead Evaluate for HBO Therapy - compromise for a failed flap 2.0 ATA for 90 Minutes with 2 Five (5) Minute A Breaks ir Total Number of Treatments: - 20 One treatments per day (delivered Monday through Friday unless otherwise specified in Special Instructions below): Wound Treatment Electronic Signature(s) Signed: 10/23/2022 3:34:20 PM By: Geralyn Corwin DO Signed: 10/23/2022 6:01:11 PM By: Shawn Stall RN, BSN Entered By: Shawn Stall on 10/23/2022 13:45:57 -------------------------------------------------------------------------------- Problem List Details Patient Name: Date of Service: [name], [name] 10/23/2022 1:00 PM Medical Record Number: [id number] Patient Account Number: [id number] Date of Birth/Sex: Treating RN: [birth year] (52 y.o. 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 10/18/2022 No Yes Inactive Problems [name], [name] ([id number]) [id number] Page 2 of 2 Resolved Problems Electronic Signature(s) Signed: 10/23/2022 4:52:23 PM By: Karl Bales EMT Signed: 10/24/2022 2:12:00 PM By: Geralyn Corwin DO Previous Signature: 10/23/2022 3:34:20 PM Version By: Geralyn Corwin DO Entered By: Karl Bales on 10/23/2022 16:52:23 -------------------------------------------------------------------------------- SuperBill Details Patient Name: Date of Service: [name], [name] 10/23/2022 Medical Record Number: [id number] Patient Account Number: [id number] Date of Birth/Sex: Treating RN: 1970/05/21 (52 y.o. 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 Disruption of external operation (surgical) wound, not elsewhere classified  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

## 2022-10-25 NOTE — Progress Notes (Signed)
ZURIYAH, SHATZ ([id number]) [id number] Page 1 of 2 Visit Report for 10/24/2022 Problem List Details Patient Name: Date of Service: [name], [name] 10/24/2022 1:00 PM Medical Record Number: [id number] Patient Account Number: [id number] Date of Birth/Sex: Treating RN: 1971/03/13 (52 y.o. 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

## 2022-10-26 ENCOUNTER — Encounter (HOSPITAL_BASED_OUTPATIENT_CLINIC_OR_DEPARTMENT_OTHER): Payer: Medicare Other | Admitting: Internal Medicine

## 2022-10-26 DIAGNOSIS — H0270 Unspecified degenerative disorders of eyelid and periocular area: Secondary | ICD-10-CM | POA: Diagnosis not present

## 2022-10-26 DIAGNOSIS — T8131XD Disruption of external operation (surgical) wound, not elsewhere classified, subsequent encounter: Secondary | ICD-10-CM | POA: Diagnosis not present

## 2022-10-26 DIAGNOSIS — T86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: Secondary | ICD-10-CM | POA: Diagnosis not present

## 2022-10-27 NOTE — Progress Notes (Addendum)
[name] ([id number]) [id number] Page 1 of 2 Visit Report for 10/25/2022 Arrival Information Details Patient Name: Date of Service: [name], [name] 10/25/2022 11:30 A M Medical Record Number: [id number] Patient Account Number: [id number] Date of Birth/Sex: Treating RN: [birth year] (52 y.o. [name] Primary Care Ashira Kirsten: Janece Canterbury Other Clinician: Haywood Pao Referring Freddi Forster: Treating Kimbley Sprague/Extender: Luan Pulling in Treatment: 1 Visit Information History Since Last Visit All ordered tests and consults were completed: Yes Patient Arrived: Ambulatory Added or deleted any medications: No Arrival Time: 11:29 Any new allergies or adverse reactions: No Accompanied By: self Had a fall or experienced change in No Transfer Assistance: None activities of daily living that may affect Patient Identification Verified: Yes risk of falls: Secondary Verification Process Completed: Yes Signs or symptoms of abuse/neglect since last visito No Patient Requires Transmission-Based Precautions: No Hospitalized since last visit: No Patient Has Alerts: No Implantable device outside of the clinic excluding No cellular tissue based products placed in the center since last visit: Pain Present Now: No Electronic Signature(s) Signed: 10/25/2022 3:33:28 PM By: Haywood Pao CHT EMT BS , , Entered By: Haywood Pao on 10/25/2022 15:33:28 -------------------------------------------------------------------------------- Encounter Discharge Information Details Patient Name: Date of Service: [name] 10/25/2022 11:30 A M Medical Record Number: [id number] Patient Account Number: [id number] Date of Birth/Sex: Treating RN: 1970/12/18 (52 y.o. [name] Primary Care Laxmi Choung: Janece Canterbury Other Clinician: Haywood Pao Referring Reginae Wolfrey: Treating Thora Scherman/Extender: Luan Pulling in Treatment: 1 Encounter Discharge Information Items Discharge Condition: Stable Ambulatory Status: Ambulatory Discharge Destination: Home Transportation: Private Auto Accompanied By: self Schedule Follow-up Appointment: No Clinical Summary of Care: Electronic Signature(s) Signed: 10/25/2022 4:10:07 PM By: Haywood Pao CHT EMT BS , , Entered By: Haywood Pao on 10/25/2022 16:10:07 [name] ([id number]) [id number] Page 2 of 2 -------------------------------------------------------------------------------- Vitals Details Patient Name: Date of Service: [name], [name] 10/25/2022 11:30 A M Medical Record Number: [id number] Patient Account Number: [id number] Date of Birth/Sex: Treating RN: 1971-02-03 (52 y.o. [name] Primary Care Issaic Welliver: Janece Canterbury Other Clinician: Haywood Pao Referring Anakaren Campion: Treating Antoine Vandermeulen/Extender: Luan Pulling in Treatment: 1 Vital Signs Time Taken: 11:53 Temperature (F): 98.8 Height (in): 65 Pulse (bpm): 79 Respiratory Rate (breaths/min): 18 Blood Pressure (mmHg): 122/80 Reference Range: 80 - 120 mg / dl Electronic Signature(s) Signed: 10/25/2022 3:34:04 PM By: Haywood Pao CHT EMT BS , , Entered By: Haywood Pao on 10/25/2022 15:34:04

## 2022-10-27 NOTE — Progress Notes (Signed)
XEE, GOARD ([id number]) [id number] Page 1 of 2 Visit Report for 10/25/2022 HBO Details Patient Name: Date of Service: [name], [name] 10/25/2022 11:30 A M Medical Record Number: [id number] Patient Account Number: [id number] Date of Birth/Sex: Treating RN: 14-Jan-1971 (52 y.o. Tommye Standard Primary Care Deverick Pruss: Janece Canterbury Other Clinician: Haywood Pao Referring Donasia Wimes: Treating Chelcea Zahn/Extender: Luan Pulling in Treatment: 1 HBO Treatment Course Details Treatment Course Number: 1 Ordering Kerensa Nicklas: Geralyn Corwin T Treatments Ordered: otal 20 HBO Treatment Start Date: 10/23/2022 HBO Indication: Compromised/Failed Flap/Graft to Left Forehead HBO Treatment Details Treatment Number: 3 Patient Type: Outpatient Chamber Type: Monoplace Chamber Serial #: [id number] Treatment Protocol: 2.0 ATA with 90 minutes oxygen, with two 5 minute air breaks Treatment Details Compression Rate Down: 1.0 psi / minute De-Compression Rate Up: 1.5 psi / minute A breaks and breathing ir Compress Tx Pressure periods Decompress Decompress Begins Reached (leave unused spaces Begins Ends blank) Chamber Pressure (ATA 1 2 2 2 2 2  --2 1 ) Clock Time (24 hr) 12:32 12:51 13:21 13:26 13:56 14:01 - - 14:31 14:41 Treatment Length: 129 (minutes) Treatment Segments: 4 Vital Signs Capillary Blood Glucose Reference Range: 80 - 120 mg / dl HBO Diabetic Blood Glucose Intervention Range: <131 mg/dl or >782 mg/dl Type: Time Vitals Blood Pulse: Respiratory Temperature: Capillary Blood Glucose Pulse Action Taken: Pressure: Rate: Glucose (mg/dl): Meter #: Oximetry (%) Taken: Pre 11:53 122/80 79 18 98.8 none per protocol Post 14:58 159/103 55 18 97.9 re-measured BP. Post 14:58 178/105 informed physician of BP and pulse. Treatment Response Treatment Toleration: Well Treatment Completion Status: Treatment Completed without Adverse Event Treatment  Notes Mrs. Gotto arrived with normal vital signs. She prepared for treatment. After performing a safety check, she was placed in the chamber which was compressed at a rate of 1 psi/min. Patient complained of difficulty equalizing her right ear at approximately 4 psig. Pressure was reduced to ambient pressure by turning off the machine. Patient self-administered Afrin and attempted again. Ventilation rate was on full and pressure was increased stepwise and constantly monitored. She denied any further issues with ear equalization and did not have any instances of pain associated with barotrauma. Patient tolerated the treatment and subsequent decompression at the rate of 1.5 psi/min without any issues. Her post-treatment diastolic blood pressure was higher than protocol and heart rate was below 60 bpm. She denied any symptoms associated with bradycardia. Blood pressure was remeasured with a similar result. She stated that she felt fine. She was stable upon discharge to wound care visit in the clinic and upon discharge from clinic today. Physician HBO Attestation: I certify that I supervised this HBO treatment in accordance with Medicare guidelines. A trained emergency response team is readily available per Yes hospital policies and procedures. Continue HBOT as ordered. Yes Electronic Signature(s) Unsigned Previous Signature: 10/25/2022 3:47:13 PM Version By: Haywood Pao CHT EMT BS , , Entered By: Geralyn Corwin on 10/26/2022 13:06:08 Signature(s): [name] ([id number]) [id number] Date(s): [id number] Page 2 of 2 -------------------------------------------------------------------------------- HBO Safety Checklist Details Patient Name: Date of Service: [name], [name] 10/25/2022 11:30 A M Medical Record Number: [id number] Patient Account Number: [id number] Date of Birth/Sex: Treating RN: 03/26/1971 (52 y.o. Tommye Standard Primary Care Abhimanyu Cruces: Janece Canterbury Other Clinician: Haywood Pao Referring Alphonsa Brickle: Treating Jaycee Pelzer/Extender: Luan Pulling in Treatment: 1 HBO Safety Checklist Items Safety Checklist Consent Form Signed Patient voided / foley secured and emptied When did you last eato 0930 Last dose  of injectable or oral agent n/a Ostomy pouch emptied and vented if applicable NA All implantable devices assessed, documented and approved NA Intravenous access site secured and place NA Valuables secured Linens and cotton and cotton/polyester blend (less than 51% polyester) Personal oil-based products / skin lotions / body lotions removed Wigs or hairpieces removed NA Smoking or tobacco materials removed NA Books / newspapers / magazines / loose paper removed Cologne, aftershave, perfume and deodorant removed Jewelry removed (may wrap wedding band) Make-up removed Hair care products removed Battery operated devices (external) removed Heating patches and chemical warmers removed Titanium eyewear removed Nail polish cured greater than 10 hours NA Casting material cured greater than 10 hours NA Hearing aids removed NA Loose dentures or partials removed NA Prosthetics have been removed NA Patient demonstrates correct use of air break device (if applicable) Patient concerns have been addressed Patient grounding bracelet on and cord attached to chamber Specifics for Inpatients (complete in addition to above) Medication sheet sent with patient NA Intravenous medications needed or due during therapy sent with patient NA Drainage tubes (e.g. nasogastric tube or chest tube secured and vented) NA Endotracheal or Tracheotomy tube secured NA Cuff deflated of air and inflated with saline NA Airway suctioned NA Notes Paper version used prior to treatment start. Electronic Signature(s) Signed: 10/25/2022 3:35:29 PM By: Haywood Pao CHT EMT BS , , Entered By: Haywood Pao on  10/25/2022 15:35:28

## 2022-10-28 NOTE — Progress Notes (Addendum)
[name] ([id number]) [id number] Page 1 of 3 Visit Report for 10/26/2022 HBO Details Patient Name: Date of Service: [name], [name] 10/26/2022 12:00 PM Medical Record Number: [id number] Patient Account Number: [id number] Date of Birth/Sex: Treating RN: 08/06/1970 (52 y.o. [name] Primary Care Karas Pickerill: Janece Canterbury Other Clinician: Haywood Pao Referring Collyn Ribas: Treating Hula Tasso/Extender: Luan Pulling in Treatment: 1 HBO Treatment Course Details Treatment Course Number: 1 Ordering Natividad Schlosser: Geralyn Corwin T Treatments Ordered: otal 20 HBO Treatment Start Date: 10/23/2022 HBO Indication: Compromised/Failed Flap/Graft to Left Forehead HBO Treatment Details Treatment Number: 4 Patient Type: Outpatient Chamber Type: Monoplace Chamber Serial #: [id number] Treatment Protocol: 2.0 ATA with 90 minutes oxygen, with two 5 minute air breaks Treatment Details Compression Rate Down: 1.0 psi / minute De-Compression Rate Up: 1.0 psi / minute A breaks and breathing ir Compress Tx Pressure periods Decompress Decompress Begins Reached (leave unused spaces Begins Ends blank) Chamber Pressure (ATA 1 2 2 2 2 2  --2 1 ) Clock Time (24 hr) 12:28 12:55 13:25 13:30 14:00 14:05 - - 14:20 14:34 Treatment Length: 126 (minutes) Treatment Segments: 4 Vital Signs Capillary Blood Glucose Reference Range: 80 - 120 mg / dl HBO Diabetic Blood Glucose Intervention Range: <131 mg/dl or >962 mg/dl Time Vitals Blood Respiratory Capillary Blood Glucose Pulse Action Type: Pulse: Temperature: Taken: Pressure: Rate: Glucose (mg/dl): Meter #: Oximetry (%) Taken: Pre 11:48 88/62 95 18 97.7 98 Post 14:43 124/95 57 18 98.2 Treatment Response Treatment Toleration: Fair Treatment Completion Status: Treatment Completed without Adverse Event Treatment Notes Mrs. Devlin arrived with BP of 88/62 mmHg. Physician informed. Patient denies  symptoms of hypotension. She prepared for treatment. After performing a safety check, she was placed in the chamber which was compressed at a rate of 1 psi/min. Patient complained of difficulty equalizing her right ear at approximately 13 psig and experienced pain. Pressure was reduced to approximately 9.5 psig. Patient was able to equalize but was slow to equalize. Pressure was increased incrementally with stops for a moment to allow equalization. Ventilation rate was on full and pressure was increased stepwise and constantly monitored. She denied any further issues with ear equalization and did not have any instances of pain associated with barotrauma. Patient tolerated the treatment and subsequent decompression at the rate of 1 psi/min without any issues. Her post-treatment heart rate was below 60 bpm. She denied any symptoms associated with bradycardia. Dr. Mikey Bussing was called to examine patient's ears as she was not able to clear her right ear. She will be referred to ENT for tympanostomy/PE tubes. She was stable upon discharge. Ivi Griffith Notes Patient having discomfort in her right ear with difficulty equalizing pressures during hyperbaric oxygen therapy. On ear exam tympanic membrane had fluid bubbles with a teed score of 1/2. I recommended she follow-up with ENT for tubes and to restart HBO therapy once this is completed. Patient expressed understanding. Post Treatment Teed Score Post Treatment T Score: Left Ear eed Grade 0 Post Treatment T Score: Right Ear eed Grade II Physician HBO Attestation: I certify that I supervised this HBO treatment in accordance with Medicare guidelines. A trained emergency response team is readily available per Yes hospital policies and procedures. THETIS, SPARLING ([id number]) [id number] Page 2 of 3 Continue HBOT as ordered. Yes Electronic Signature(s) Signed: 11/01/2022 4:51:38 PM By: Haywood Pao CHT EMT BS , , Signed:  11/12/2022 1:38:23 PM By: Geralyn Corwin DO Previous Signature: 10/29/2022 12:31:21 PM Version By: Geralyn Corwin DO Previous Signature: 10/26/2022 4:33:19  PM Version By: Haywood Pao CHT EMT BS , , Entered By: Haywood Pao on 11/01/2022 16:51:38 -------------------------------------------------------------------------------- HBO Safety Checklist Details Patient Name: Date of Service: [name], [name] 10/26/2022 12:00 PM Medical Record Number: [id number] Patient Account Number: [id number] Date of Birth/Sex: Treating RN: [birth year] (52 y.o. [name] Primary Care Jerzy Crotteau: Janece Canterbury Other Clinician: Haywood Pao Referring Erionna Strum: Treating Othel Dicostanzo/Extender: Luan Pulling in Treatment: 1 HBO Safety Checklist Items Safety Checklist Consent Form Signed Patient voided / foley secured and emptied When did you last eato 0900 Last dose of injectable or oral agent n/a Ostomy pouch emptied and vented if applicable NA All implantable devices assessed, documented and approved NA Intravenous access site secured and place NA Valuables secured Linens and cotton and cotton/polyester blend (less than 51% polyester) Personal oil-based products / skin lotions / body lotions removed Wigs or hairpieces removed NA Smoking or tobacco materials removed NA Books / newspapers / magazines / loose paper removed Cologne, aftershave, perfume and deodorant removed Jewelry removed (may wrap wedding band) Make-up removed Hair care products removed Battery operated devices (external) removed Heating patches and chemical warmers removed Titanium eyewear removed Nail polish cured greater than 10 hours NA Casting material cured greater than 10 hours NA Hearing aids removed NA Loose dentures or partials removed NA Prosthetics have been removed NA Patient demonstrates correct use of air break device (if applicable) Patient concerns have been  addressed Patient grounding bracelet on and cord attached to chamber Specifics for Inpatients (complete in addition to above) Medication sheet sent with patient NA Intravenous medications needed or due during therapy sent with patient NA Drainage tubes (e.g. nasogastric tube or chest tube secured and vented) NA Endotracheal or Tracheotomy tube secured NA Cuff deflated of air and inflated with saline NA Airway suctioned NA Notes Paper version used prior to treatment start. Electronic Signature(s) Signed: 10/26/2022 4:23:58 PM By: Haywood Pao CHT EMT BS , , [place], [name] ([id number]) By: Haywood Pao CHT EMT BS [phone].pdf Page 3 of 3 Signed: 10/26/2022 4:23:58 PM , , Entered By: Haywood Pao on 10/26/2022 16:23:58

## 2022-10-28 NOTE — Progress Notes (Signed)
ANTIQUA, [name] ([id number]) [id number] Page 1 of 2 Visit Report for 10/26/2022 Arrival Information Details Patient Name: Date of Service: [name], [name] 10/26/2022 12:00 PM Medical Record Number: [id number] Patient Account Number: [id number] Date of Birth/Sex: Treating RN: May 21, 1970 (52 y.o. [name] Primary Care Gurvir Schrom: Janece Canterbury Other Clinician: Haywood Pao Referring Donasia Wimes: Treating Koree Staheli/Extender: Luan Pulling in Treatment: 1 Visit Information History Since Last Visit All ordered tests and consults were completed: Yes Patient Arrived: Ambulatory Added or deleted any medications: No Arrival Time: 11:28 Any new allergies or adverse reactions: No Accompanied By: self Had a fall or experienced change in No Transfer Assistance: None activities of daily living that may affect Patient Identification Verified: Yes risk of falls: Secondary Verification Process Completed: Yes Signs or symptoms of abuse/neglect since last visito No Patient Requires Transmission-Based Precautions: No Hospitalized since last visit: No Patient Has Alerts: No Implantable device outside of the clinic excluding No cellular tissue based products placed in the center since last visit: Pain Present Now: No Electronic Signature(s) Signed: 10/26/2022 4:22:15 PM By: Haywood Pao CHT EMT BS , , Entered By: Haywood Pao on 10/26/2022 16:22:15 -------------------------------------------------------------------------------- Encounter Discharge Information Details Patient Name: Date of Service: [name], [name] 10/26/2022 12:00 PM Medical Record Number: [id number] Patient Account Number: [id number] Date of Birth/Sex: Treating RN: [birth year] (52 y.o. [name] Primary Care Helvi Royals: Janece Canterbury Other Clinician: Haywood Pao Referring Shonna Deiter: Treating Chritopher Coster/Extender: Luan Pulling in Treatment: 1 Encounter Discharge Information Items Discharge Condition: Stable Ambulatory Status: Ambulatory Discharge Destination: Home Transportation: Private Auto Accompanied By: driver/family member Schedule Follow-up Appointment: No Clinical Summary of Care: Electronic Signature(s) Signed: 10/26/2022 4:34:06 PM By: Haywood Pao CHT EMT BS , , Entered By: Haywood Pao on 10/26/2022 16:34:06 [name] ([id number]) [id number] Page 2 of 2 -------------------------------------------------------------------------------- Vitals Details Patient Name: Date of Service: [name] 10/26/2022 12:00 PM Medical Record Number: [id number] Patient Account Number: [id number] Date of Birth/Sex: Treating RN: 1970/10/26 (52 y.o. [name] Primary Care Rani Idler: Janece Canterbury Other Clinician: Haywood Pao Referring Adante Courington: Treating Mcclain Shall/Extender: Luan Pulling in Treatment: 1 Vital Signs Time Taken: 11:48 Temperature (F): 97.7 Height (in): 65 Pulse (bpm): 95 Respiratory Rate (breaths/min): 18 Blood Pressure (mmHg): 88/62 Reference Range: 80 - 120 mg / dl Airway Pulse Oximetry (%): 98 Electronic Signature(s) Signed: 10/26/2022 4:22:53 PM By: Haywood Pao CHT EMT BS , , Entered By: Haywood Pao on 10/26/2022 16:22:53

## 2022-10-29 NOTE — Progress Notes (Signed)
TRENADY, DEBENEDETTO ([id number]) [id number] Page 1 of 1 Visit Report for 10/26/2022 SuperBill Details Patient Name: Date of Service: [name], [name] 10/26/2022 Medical Record Number: [id number] Patient Account Number: [id number] Date of Birth/Sex: Treating RN: [birth year] (52 y.o. 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 Disruption of external operation (surgical) wound, not elsewhere classified, subsequent encounter 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

## 2022-10-29 NOTE — Progress Notes (Signed)
STAYCE, KOCA ([id number]) [id number] Page 1 of 6 Visit Report for 10/25/2022 Chief Complaint Document Details Patient Name: Date of Service: [name], [name] 10/25/2022 2:45 PM Medical Record Number: [id number] Patient Account Number: [id number] Date of Birth/Sex: Treating RN: 1971-04-08 (52 y.o. F) Primary Care Provider: Janece Canterbury Other Clinician: Referring Provider: Treating Provider/Extender: Luan Pulling in Treatment: 1 Information Obtained from: Patient Chief Complaint 10/18/2022; patient was referred here by plastic surgery for consideration of hyperbaric oxygen for flap preservation after surgery on 10/09/2022. Electronic Signature(s) Signed: 10/29/2022 12:26:35 PM By: Geralyn Corwin DO Entered By: Geralyn Corwin on 10/25/2022 15:06:56 -------------------------------------------------------------------------------- HPI Details Patient Name: Date of Service: [name], [name] 10/25/2022 2:45 PM Medical Record Number: [id number] Patient Account Number: [id number] Date of Birth/Sex: Treating RN: [birth year] (52 y.o. F) Primary Care Provider: Janece Canterbury Other Clinician: Referring Provider: Treating Provider/Extender: Luan Pulling in Treatment: 1 History of Present Illness HPI Description: 10/18/2022 ADMISSION This is a 52 year old woman who had progressive problems with ptosis bilaterally. On 5/28 she underwent a trichophytic brow ptosis repair and upper blepharoplasty. The patient tells me that she actually did really well until about day 4. She then developed an area of necrosis on the left part of the flap. She is also developed pain along the incision line and ballotable fluid on the right edge of the surgical line/flap. She is taking Vicodin for the pain putting topical mupirocin on the incision line. She was on warm compresses as well. She was on Augmentin but I believe she is finished  this. She does not complain of any visual disturbances. No fever. She has been referred here by plastic surgery for consideration of hyperbaric oxygen for flap necrosis/failed flap Past medical history includes multiple sclerosis, hypothyroidism, cervical and lumbar spondylosis and hypertension 6/13; patient presents for follow-up. She started hyperbaric oxygen therapy on 6/11 and has tolerated it well. She has no issues or complaints today. She has a small necrotic wound on the left front side of her head from a failed flap. She has been placing antibiotic ointment to this area. She has no issues or complaints today. Area is smaller today. Electronic Signature(s) Signed: 10/29/2022 12:26:35 PM By: Geralyn Corwin DO Entered By: Geralyn Corwin on 10/25/2022 15:08:16 [name] ([id number]) [id number] Page 2 of 6 -------------------------------------------------------------------------------- Physical Exam Details Patient Name: Date of Service: [name], [name] 10/25/2022 2:45 PM Medical Record Number: [id number] Patient Account Number: [id number] Date of Birth/Sex: Treating RN: Aug 21, 1970 (52 y.o. F) Primary Care Provider: Janece Canterbury Other Clinician: Referring Provider: Treating Provider/Extender: Luan Pulling in Treatment: 1 Constitutional respirations regular, non-labored and within target range for patient.. Cardiovascular 2+ dorsalis pedis/posterior tibialis pulses. Psychiatric pleasant and cooperative. Notes Small area of necrosis on the left part of the head. Other than this all incision sites appear well-healing. No tenderness to palpation to the forehead. Electronic Signature(s) Signed: 10/29/2022 12:26:35 PM By: Geralyn Corwin DO Entered By: Geralyn Corwin on 10/25/2022 15:09:23 -------------------------------------------------------------------------------- Physician Orders Details Patient Name: Date of  Service: [name], [name] 10/25/2022 2:45 PM Medical Record Number: [id number] Patient Account Number: [id number] Date of Birth/Sex: Treating RN: 1970/05/16 (52 y.o. Orville Govern Primary Care Provider: Janece Canterbury Other Clinician: Referring Provider: Treating Provider/Extender: Luan Pulling in Treatment: 1 Verbal / Phone Orders: No Diagnosis Coding Follow-up Appointments ppointment in 2 weeks. - Dr Ronnald Collum. Leanord Hawking Return A +++HBO ++++ Hyperbaric Oxygen Therapy Wound #1 Left  Forehead Evaluate for HBO Therapy - compromise for a failed flap 2.0 ATA for 90 Minutes with 2 Five (5) Minute A Breaks ir Total Number of Treatments: - 20 One treatments per day (delivered Monday through Friday unless otherwise specified in Special Instructions below): Wound Treatment Wound #1 - Forehead Wound Laterality: Left Topical: Mupirocin Ointment 3 x Per Day/30 Days Discharge Instructions: Apply Mupirocin (Bactroban) as instructed Electronic Signature(s) Signed: 10/29/2022 12:26:35 PM By: Geralyn Corwin DO Entered By: Geralyn Corwin on 10/25/2022 15:09:28 [name] ([id number]) [id number] Page 3 of 6 -------------------------------------------------------------------------------- Problem List Details Patient Name: Date of Service: [name], [name] 10/25/2022 2:45 PM Medical Record Number: [id number] Patient Account Number: [id number] Date of Birth/Sex: Treating RN: Aug 03, 1970 (52 y.o. 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 F) Primary Care Provider: Janece Canterbury Other Clinician: Referring Provider: Treating Provider/Extender: Luan Pulling in Treatment: 1 Subjective Chief Complaint Information obtained from Patient 10/18/2022; patient was referred here by plastic surgery for consideration of hyperbaric oxygen for flap preservation after surgery on 10/09/2022. History of Present Illness (HPI) 10/18/2022 ADMISSION This is a 52 year old woman who had progressive problems with ptosis bilaterally. On 5/28 she underwent a trichophytic brow ptosis repair and upper blepharoplasty. The patient tells me that she actually did really well until about day 4. She then developed an area of necrosis on the left part of the flap. She is also developed pain along the incision line and ballotable fluid on the right edge of the surgical line/flap. She is taking Vicodin for the pain putting topical mupirocin on the incision line. She was on warm compresses as well. She was on Augmentin but I believe she is finished this. She does not complain of any visual disturbances. No fever. She has been referred here by plastic surgery for consideration of hyperbaric oxygen for flap necrosis/failed flap [name], [name] ([id number]) [id number] Page 4 of 6 Past medical history includes multiple sclerosis, hypothyroidism, cervical and lumbar spondylosis and  hypertension 6/13; patient presents for follow-up. She started hyperbaric oxygen therapy on 6/11 and has tolerated it well. She has no issues or complaints today. She has a small necrotic wound on the left front side of her head from a failed flap. She has been placing antibiotic ointment to this area. She has no issues or complaints today. Area is smaller today. Patient History Family History No family history of Cancer, Diabetes, Heart Disease, Hereditary Spherocytosis, Hypertension, Kidney Disease, Lung Disease, Seizures, Stroke, Thyroid Problems, Tuberculosis. Social History Former smoker - quit - ended on 08/28/2022, Marital Status - Divorced, Alcohol Use - Never, Drug Use - No History. Medical History Respiratory Patient has history of Asthma Musculoskeletal Patient has history of Osteoarthritis Hospitalization/Surgery History - left knee surgery x2 2019 and 2019. - hernia repair. - cholecystectomy. - ORIF left radial. - carpel tunnel  release. - rotator cuff repair. - 10/09/2022 bilateral upper eyelids ptosis, trichophytic brow ptosis repair and upper blepharophasty. Medical A Surgical History Notes nd Constitutional Symptoms (General Health) hypothyroidism Musculoskeletal MS fibromyalgia neuromuscular disorder Psychiatric Depression Objective Constitutional respirations regular, non-labored and within target range for patient.. Vitals Time Taken: 2:44 PM, Height: 65 in, Temperature: 97.9 F, Pulse: 55 bpm, Respiratory Rate: 18 breaths/min, Blood Pressure: 159/103 mmHg. Cardiovascular 2+ dorsalis pedis/posterior tibialis pulses. Psychiatric pleasant and cooperative. General Notes: Small area of necrosis on the left part of the head. Other than this all incision sites appear well-healing. No tenderness to palpation to the forehead. Integumentary (Hair, Skin) Wound #1 status is Open. Original cause of wound was Surgical Injury. The date acquired was: 10/09/2022. The wound has been  in treatment 1 weeks. The wound is located on the Left Forehead. The wound measures 0.5cm length x 0.5cm width x 0.1cm depth; 0.196cm^2 area and 0.02cm^3 volume. There is no tunneling or undermining noted. There is a none present amount of drainage noted. The wound margin is distinct with the outline attached to the wound base. There is no granulation within the wound bed. There is a large (67-100%) amount of necrotic tissue within the wound bed including Eschar. The periwound skin appearance did not exhibit: Callus, Crepitus, Excoriation, Induration, Rash, Scarring, Dry/Scaly, Maceration, Atrophie Blanche, Cyanosis, Ecchymosis, Hemosiderin Staining, Mottled, Pallor, Rubor, Erythema. 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 She is completing hyperbaric oxygen therapy for compromised/failed flap. She can continue using antibiotic ointment to the area. Follow-up in 2 weeks. Plan [name], [name] ([id number]) [id number] Page 5 of 6 Follow-up Appointments: Return Appointment in 2 weeks. - Dr Ronnald Collum. Leanord Hawking +++HBO ++++ Hyperbaric Oxygen Therapy: Wound #1 Left Forehead: Evaluate for HBO Therapy - compromise for a failed flap 2.0 ATA for 90 Minutes with 2 Five (5) Minute Air Breaks T Number of Treatments: - 20 otal One treatments per day (delivered Monday through Friday unless otherwise specified in Special Instructions below): WOUND #1: - Forehead Wound Laterality: Left Topical: Mupirocin Ointment 3 x Per Day/30 Days Discharge Instructions: Apply Mupirocin (Bactroban) as instructed 1. Antibiotic ointment 2. Follow-up in 2 weeks 3. Continue hyperbaric  oxygen therapy Electronic Signature(s) Signed: 10/29/2022 12:26:35 PM By: Geralyn Corwin DO Entered By: Geralyn Corwin on 10/25/2022 15:11:12 -------------------------------------------------------------------------------- HxROS Details Patient Name: Date of Service: [name], [name] 10/25/2022 2:45 PM Medical Record Number: [id number] Patient Account Number: [id number] Date of Birth/Sex: Treating RN: 1970-11-03 (52 y.o. F) Primary Care Provider: Janece Canterbury Other Clinician: Referring Provider: Treating Provider/Extender: Luan Pulling in Treatment: 1 Constitutional Symptoms (General Health) Medical History: Past Medical History Notes: hypothyroidism Respiratory Medical History: Positive for: Asthma Musculoskeletal Medical History: Positive for: Osteoarthritis Past Medical History Notes: MS fibromyalgia neuromuscular disorder Psychiatric Medical History: Past Medical History Notes: Depression Immunizations Pneumococcal Vaccine: Received Pneumococcal Vaccination: No Implantable Devices No devices added Hospitalization / Surgery History Type of Hospitalization/Surgery left knee surgery x2 2019 and 2019 [name], [name] ([id number]) [id number] Page 6 of 6 hernia repair cholecystectomy ORIF left radial carpel tunnel release rotator cuff repair 10/09/2022 bilateral upper eyelids ptosis, trichophytic brow ptosis repair and upper blepharophasty Family and Social History Cancer: No; Diabetes: No; Heart Disease: No; Hereditary Spherocytosis: No; Hypertension: No; Kidney Disease: No; Lung  Disease: No; Seizures: No; Stroke: No; Thyroid Problems: No; Tuberculosis: No; Former smoker - quit - ended on 08/28/2022; Marital Status - Divorced; Alcohol Use: Never; Drug Use: No History; Financial Concerns: No; Food, Clothing or Shelter Needs: No; Support System Lacking: No; Transportation Concerns: No Electronic  Signature(s) Signed: 10/29/2022 12:26:35 PM By: Geralyn Corwin DO Entered By: Geralyn Corwin on 10/25/2022 15:08:20 -------------------------------------------------------------------------------- SuperBill Details Patient Name: Date of Service: [name], [name] 10/25/2022 Medical Record Number: [id number] Patient Account Number: [id number] Date of Birth/Sex: Treating RN: 02-06-1971 (52 y.o. 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

## 2022-10-29 NOTE — Progress Notes (Signed)
CHAMAINE, ANDEL ([id number]) [id number] Page 1 of 8 Visit Report for 10/25/2022 Arrival Information Details Patient Name: Date of Service: [name], [name] 10/25/2022 2:45 PM Medical Record Number: [id number] Patient Account Number: [id number] Date of Birth/Sex: Treating RN: 1970-12-26 (52 y.o. Dola Argyle, Lyla Son Primary Care Jaelyne Deeg: Janece Canterbury Other Clinician: Referring Antwine Agosto: Treating Duana Benedict/Extender: Luan Pulling in Treatment: 1 Visit Information History Since Last Visit Added or deleted any medications: No Patient Arrived: Ambulatory Any new allergies or adverse reactions: No Arrival Time: 14:50 Had a fall or experienced change in No Accompanied By: SELF activities of daily living that may affect Transfer Assistance: None risk of falls: Patient Identification Verified: Yes Signs or symptoms of abuse/neglect since last visito No Secondary Verification Process Completed: Yes Hospitalized since last visit: No Patient Requires Transmission-Based Precautions: No Implantable device outside of the clinic excluding No Patient Has Alerts: No cellular tissue based products placed in the center since last visit: Pain Present Now: Yes Electronic Signature(s) Signed: 10/25/2022 4:11:58 PM By: Redmond Pulling RN, BSN Entered By: Redmond Pulling on 10/25/2022 14:51:22 -------------------------------------------------------------------------------- Clinic Level of Care Assessment Details Patient Name: Date of Service: [name], [name] 10/25/2022 2:45 PM Medical Record Number: [id number] Patient Account Number: [id number] Date of Birth/Sex: Treating RN: 1971-01-16 (52 y.o. Orville Govern Primary Care Mycala Warshawsky: Janece Canterbury Other Clinician: Referring Henry Demeritt: Treating Sanah Kraska/Extender: Luan Pulling in Treatment: 1 Clinic Level of Care Assessment Items TOOL 4 Quantity Score X- 1 0 Use when only  an EandM is performed on FOLLOW-UP visit ASSESSMENTS - Nursing Assessment / Reassessment X- 1 10 Reassessment of Co-morbidities (includes updates in patient status) X- 1 5 Reassessment of Adherence to Treatment Plan ASSESSMENTS - Wound and Skin A ssessment / Reassessment X - Simple Wound Assessment / Reassessment - one wound 1 5 []  - 0 Complex Wound Assessment / Reassessment - multiple wounds []  - 0 Dermatologic / Skin Assessment (not related to wound area) ASSESSMENTS - Focused Assessment []  - 0 Circumferential Edema Measurements - multi extremities []  - 0 Nutritional Assessment / Counseling / Intervention []  - 0 Lower Extremity Assessment (monofilament, tuning fork, pulses) [name], [name] ([id number]) [id number] Page 2 of 8 []  - 0 Peripheral Arterial Disease Assessment (using hand held doppler) ASSESSMENTS - Ostomy and/or Continence Assessment and Care []  - 0 Incontinence Assessment and Management []  - 0 Ostomy Care Assessment and Management (repouching, etc.) PROCESS - Coordination of Care X - Simple Patient / Family Education for ongoing care 1 15 []  - 0 Complex (extensive) Patient / Family Education for ongoing care X- 1 10 Staff obtains Chiropractor, Records, T Results / Process Orders est []  - 0 Staff telephones HHA, Nursing Homes / Clarify orders / etc []  - 0 Routine Transfer to another Facility (non-emergent condition) []  - 0 Routine Hospital Admission (non-emergent condition) []  - 0 New Admissions / Manufacturing engineer / Ordering NPWT Apligraf, etc. , []  - 0 Emergency Hospital Admission (emergent condition) []  - 0 Simple Discharge Coordination []  - 0 Complex (extensive) Discharge Coordination PROCESS - Special Needs []  - 0 Pediatric / Minor Patient Management []  - 0 Isolation Patient Management []  - 0 Hearing / Language / Visual special needs []  - 0 Assessment of Community assistance (transportation, D/C planning,  etc.) []  - 0 Additional assistance / Altered mentation []  - 0 Support Surface(s) Assessment (bed, cushion, seat, etc.) INTERVENTIONS - Wound Cleansing / Measurement X - Simple Wound Cleansing - one wound 1 5 []  -  0 Complex Wound Cleansing - multiple wounds X- 1 5 Wound Imaging (photographs - any number of wounds) []  - 0 Wound Tracing (instead of photographs) X- 1 5 Simple Wound Measurement - one wound []  - 0 Complex Wound Measurement - multiple wounds INTERVENTIONS - Wound Dressings []  - 0 Small Wound Dressing one or multiple wounds []  - 0 Medium Wound Dressing one or multiple wounds []  - 0 Large Wound Dressing one or multiple wounds []  - 0 Application of Medications - topical []  - 0 Application of Medications - injection INTERVENTIONS - Miscellaneous []  - 0 External ear exam []  - 0 Specimen Collection (cultures, biopsies, blood, body fluids, etc.) []  - 0 Specimen(s) / Culture(s) sent or taken to Lab for analysis []  - 0 Patient Transfer (multiple staff / Nurse, adult / Similar devices) []  - 0 Simple Staple / Suture removal (25 or less) []  - 0 Complex Staple / Suture removal (26 or more) []  - 0 Hypo / Hyperglycemic Management (close monitor of Blood Glucose) []  - 0 Ankle / Brachial Index (ABI) - do not check if billed separately GANA, GILGENBACH ([id number]) [id number] Page 3 of 8 X- 1 5 Vital Signs Has the patient been seen at the hospital within the last three years: Yes Total Score: 65 Level Of Care: New/Established - Level 2 Electronic Signature(s) Signed: 10/25/2022 4:11:58 PM By: Redmond Pulling RN, BSN Entered By: Redmond Pulling on 10/25/2022 15:11:24 -------------------------------------------------------------------------------- Encounter Discharge Information Details Patient Name: Date of Service: [name] 10/25/2022 2:45 PM Medical Record Number: [id number] Patient Account Number: [id number] Date of Birth/Sex:  Treating RN: 10/20/1970 (52 y.o. Orville Govern Primary Care Berta Denson: Janece Canterbury Other Clinician: Referring Demetria Iwai: Treating Kaleigh Spiegelman/Extender: Luan Pulling in Treatment: 1 Encounter Discharge Information Items Discharge Condition: Stable Ambulatory Status: Ambulatory Discharge Destination: Home Transportation: Private Auto Accompanied By: SELF Schedule Follow-up Appointment: Yes Clinical Summary of Care: Patient Declined Electronic Signature(s) Signed: 10/25/2022 4:11:58 PM By: Redmond Pulling RN, BSN Entered By: Redmond Pulling on 10/25/2022 15:12:03 -------------------------------------------------------------------------------- Lower Extremity Assessment Details Patient Name: Date of Service: [name], [name] 10/25/2022 2:45 PM Medical Record Number: [id number] Patient Account Number: [id number] Date of Birth/Sex: Treating RN: Jul 06, 1970 (52 y.o. Orville Govern Primary Care Luie Laneve: Janece Canterbury Other Clinician: Referring Giavonna Pflum: Treating Elam Ellis/Extender: Luan Pulling in Treatment: 1 Electronic Signature(s) Signed: 10/25/2022 4:11:58 PM By: Redmond Pulling RN, BSN Entered By: Redmond Pulling on 10/25/2022 14:51:44 -------------------------------------------------------------------------------- Multi Wound Chart Details Patient Name: Date of Service: [name] 10/25/2022 2:45 PM Medical Record Number: [id number] Patient Account Number: [id number] Date of Birth/Sex: Treating RN: 1970/12/29 (52 y.o. Oran Rein, [name] ([id number]) [id number] Page 4 of 8 Primary Care Eyva Califano: Janece Canterbury Other Clinician: Referring Trigger Frasier: Treating Jayen Bromwell/Extender: Luan Pulling in Treatment: 1 Vital Signs Height(in): 65 Pulse(bpm): 55 Weight(lbs): Blood Pressure(mmHg): 159/103 Body Mass Index(BMI): Temperature(F): 97.9 Respiratory Rate(breaths/min):  18 [1:Photos:] [N/A:N/A] Left Forehead N/A N/A Wound Location: Surgical Injury N/A N/A Wounding Event: Compromised Skin Graft/Flap N/A N/A Primary Etiology: Asthma, Osteoarthritis N/A N/A Comorbid History: 10/09/2022 N/A N/A Date Acquired: 1 N/A N/A Weeks of Treatment: Open N/A N/A Wound Status: No N/A N/A Wound Recurrence: 0.5x0.5x0.1 N/A N/A Measurements L x W x D (cm) 0.196 N/A N/A A (cm) : rea 0.02 N/A N/A Volume (cm) : 87.50% N/A N/A % Reduction in A rea: 87.30% N/A N/A % Reduction in Volume: Unclassifiable N/A N/A Classification: None Present N/A N/A Exudate A mount:  Distinct, outline attached N/A N/A Wound Margin: None Present (0%) N/A N/A Granulation A mount: Large (67-100%) N/A N/A Necrotic A mount: Eschar N/A N/A Necrotic Tissue: Fascia: No N/A N/A Exposed Structures: Fat Layer (Subcutaneous Tissue): No Tendon: No Muscle: No Joint: No Bone: No None N/A N/A Epithelialization: Excoriation: No N/A N/A Periwound Skin Texture: Induration: No Callus: No Crepitus: No Rash: No Scarring: No Maceration: No N/A N/A Periwound Skin Moisture: Dry/Scaly: No Atrophie Blanche: No N/A N/A Periwound Skin Color: Cyanosis: No Ecchymosis: No Erythema: No Hemosiderin Staining: No Mottled: No Pallor: No Rubor: No Treatment Notes Electronic Signature(s) Signed: 10/29/2022 12:26:35 PM By: Marshell Levan, [name] ([id number]) [id number] Page 5 of 8 Entered By: Geralyn Corwin on 10/25/2022 15:06:49 -------------------------------------------------------------------------------- Multi-Disciplinary Care Plan Details Patient Name: Date of Service: [name], [name] 10/25/2022 2:45 PM Medical Record Number: [id number] Patient Account Number: [id number] Date of Birth/Sex: Treating RN: 03/29/1971 (52 y.o. Orville Govern Primary Care Cherylynn Liszewski: Janece Canterbury Other Clinician: Referring Elisah Parmer: Treating  Jahmai Finelli/Extender: Luan Pulling in Treatment: 1 Active Inactive HBO Nursing Diagnoses: Potential for oxygen toxicity seizures related to delivery of 100% oxygen at an increased atmospheric pressure Goals: Patient and/or family will be able to state/discuss factors appropriate to the management of their disease process during treatment Date Initiated: 10/18/2022 Target Resolution Date: 01/12/2023 Goal Status: Active Interventions: Assess for signs and symptoms related to adverse events, including but not limited to confinement anxiety, pneumothorax, oxygen toxicity and baurotrauma Notes: Electronic Signature(s) Signed: 10/25/2022 4:11:58 PM By: Redmond Pulling RN, BSN Entered By: Redmond Pulling on 10/25/2022 15:10:22 -------------------------------------------------------------------------------- Pain Assessment Details Patient Name: Date of Service: [name], [name] 10/25/2022 2:45 PM Medical Record Number: [id number] Patient Account Number: [id number] Date of Birth/Sex: Treating RN: 01/07/1971 (52 y.o. Orville Govern Primary Care Tanajah Boulter: Janece Canterbury Other Clinician: Referring Chaynce Schafer: Treating Oday Ridings/Extender: Luan Pulling in Treatment: 1 Active Problems Location of Pain Severity and Description of Pain Patient Has Paino Yes Site Locations Rate the pain. SHAUNTAY, ADI ([id number]) [id number] Page 6 of 8 Rate the pain. Current Pain Level: 8 Pain Management and Medication Current Pain Management: Electronic Signature(s) Signed: 10/25/2022 4:11:58 PM By: Redmond Pulling RN, BSN Entered By: Redmond Pulling on 10/25/2022 14:51:36 -------------------------------------------------------------------------------- Patient/Caregiver Education Details Patient Name: Date of Service: [name] 6/13/2024andnbsp2:45 PM Medical Record Number: [id number] Patient Account Number: [id number] Date of  Birth/Gender: Treating RN: [birth year] (52 y.o. Orville Govern Primary Care Physician: Janece Canterbury Other Clinician: Referring Physician: Treating Physician/Extender: Luan Pulling in Treatment: 1 Education Assessment Education Provided To: Patient Education Topics Provided Wound/Skin Impairment: Methods: Explain/Verbal Responses: State content correctly Electronic Signature(s) Signed: 10/25/2022 4:11:58 PM By: Redmond Pulling RN, BSN Entered By: Redmond Pulling on 10/25/2022 15:10:45 -------------------------------------------------------------------------------- Wound Assessment Details Patient Name: Date of Service: [name], [name] 10/25/2022 2:45 PM Medical Record Number: [id number] Patient Account Number: [id number] Date of Birth/Sex: Treating RN: [birth year] (52 y.o. Orville Govern Primary Care Clevester Helzer: Janece Canterbury Other Clinician: Referring Brylin Stanislawski: Treating Eshaan Titzer/Extender: Judeth Cornfield [place], [name] ([id number]) [id number] Page 7 of 8 Weeks in Treatment: 1 Wound Status Wound Number: 1 Primary Etiology: Compromised Skin Graft/Flap Wound Location: Left Forehead Wound Status: Open Wounding Event: Surgical Injury Comorbid History: Asthma, Osteoarthritis Date Acquired: 10/09/2022 Weeks Of Treatment: 1 Clustered Wound: No Photos Wound Measurements Length: (cm) 0.5 Width: (cm) 0.5 Depth: (cm) 0.1 Area: (cm) 0.196 Volume: (cm) 0.02 % Reduction in Area: 87.5% %  Reduction in Volume: 87.3% Epithelialization: None Tunneling: No Undermining: No Wound Description Classification: Unclassifiable Wound Margin: Distinct, outline attached Exudate Amount: None Present Foul Odor After Cleansing: No Slough/Fibrino No Wound Bed Granulation Amount: None Present (0%) Exposed Structure Necrotic Amount: Large (67-100%) Fascia Exposed: No Necrotic Quality: Eschar Fat Layer (Subcutaneous Tissue)  Exposed: No Tendon Exposed: No Muscle Exposed: No Joint Exposed: No Bone Exposed: No Periwound Skin Texture Texture Color No Abnormalities Noted: No No Abnormalities Noted: No Callus: No Atrophie Blanche: No Crepitus: No Cyanosis: No Excoriation: No Ecchymosis: No Induration: No Erythema: No Rash: No Hemosiderin Staining: No Scarring: No Mottled: No Pallor: No Moisture Rubor: No No Abnormalities Noted: No Dry / Scaly: No Maceration: No Electronic Signature(s) Signed: 10/25/2022 4:11:58 PM By: Redmond Pulling RN, BSN Entered By: Redmond Pulling on 10/25/2022 14:58:02 Vitals Details -------------------------------------------------------------------------------- [name] ([id number]) [id number] Page 8 of 8 Patient Name: Date of Service: [name], [name] 10/25/2022 2:45 PM Medical Record Number: [id number] Patient Account Number: [id number] Date of Birth/Sex: Treating RN: 09/05/1970 (52 y.o. [name], [name] Primary Care Morning Halberg: Janece Canterbury Other Clinician: Haywood Pao Referring Andy Moye: Treating Manuel Lawhead/Extender: Luan Pulling in Treatment: 1 Vital Signs Time Taken: 14:44 Temperature (F): 97.9 Height (in): 65 Pulse (bpm): 55 Respiratory Rate (breaths/min): 18 Blood Pressure (mmHg): 159/103 Reference Range: 80 - 120 mg / dl Electronic Signature(s) Signed: 10/25/2022 2:53:56 PM By: Haywood Pao CHT EMT BS , , Entered By: Haywood Pao on 10/25/2022 14:53:56

## 2022-10-29 NOTE — Progress Notes (Signed)
ELEINA, GONG ([id number]) [id number] Page 1 of 1 Visit Report for 10/25/2022 SuperBill Details Patient Name: Date of Service: SUPRIYA, KLAMMER 10/25/2022 Medical Record Number: [id number] Patient Account Number: [id number] Date of Birth/Sex: Treating RN: [birth year] (52 y.o. 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 Disruption of external operation (surgical) wound, not elsewhere classified, subsequent encounter 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

## 2022-10-30 NOTE — Progress Notes (Signed)
[name], [name] ([id number]) [id number] Page 1 of 2 Visit Report for 10/24/2022 HBO Details Patient Name: Date of Service: [name], [name] 10/24/2022 1:00 PM Medical Record Number: [id number] Patient Account Number: [id number] Date of Birth/Sex: Treating RN: 30-Nov-1970 (52 y.o. Katrinka Blazing Primary Care Joandry Slagter: Janece Canterbury Other Clinician: Karl Bales Referring Bekka Qian: Treating Daveyon Kitchings/Extender: Oswaldo Conroy in Treatment: 0 HBO Treatment Course Details Treatment Course Number: 1 Ordering Gardy Montanari: Geralyn Corwin T Treatments Ordered: otal 20 HBO Treatment Start Date: 10/23/2022 HBO Indication: Compromised/Failed Flap/Graft to Left Forehead HBO Treatment Details Treatment Number: 2 Patient Type: Outpatient Chamber Type: Monoplace Chamber Serial #: [id number] Treatment Protocol: 2.0 ATA with 90 minutes oxygen, with two 5 minute air breaks Treatment Details Compression Rate Down: 1.5 psi / minute De-Compression Rate Up: 1.5 psi / minute A breaks and breathing ir Compress Tx Pressure periods Decompress Decompress Begins Reached (leave unused spaces Begins Ends blank) Chamber Pressure (ATA 1 2 2 2 2 2  --2 1 ) Clock Time (24 hr) 12:05 12:15 12:45 12:50 13:20 13:25 - - 13:55 14:04 Treatment Length: 119 (minutes) Treatment Segments: 4 Vital Signs Capillary Blood Glucose Reference Range: 80 - 120 mg / dl HBO Diabetic Blood Glucose Intervention Range: <131 mg/dl or >629 mg/dl Time Vitals Blood Respiratory Capillary Blood Glucose Pulse Action Type: Pulse: Temperature: Taken: Pressure: Rate: Glucose (mg/dl): Meter #: Oximetry (%) Taken: Pre 11:45 112/78 84 18 98.4 95 Post 14:13 117/82 60 18 97.4 Treatment Response Treatment Toleration: Well Treatment Completion Status: Treatment Completed without Adverse Event Electronic Signature(s) Signed: 10/24/2022 3:42:55 PM By: Karl Bales EMT Signed: 10/24/2022  4:47:25 PM By: Allen Derry PA-C Entered By: Karl Bales on 10/24/2022 15:42:55 -------------------------------------------------------------------------------- HBO Safety Checklist Details Patient Name: Date of Service: [name], Donata 10/24/2022 1:00 PM Medical Record Number: [id number] Patient Account Number: [id number] Date of Birth/Sex: Treating RN: [birth year] (52 y.o. Katrinka Blazing Primary Care Viren Lebeau: Janece Canterbury Other Clinician: Karl Bales Referring Everhett Bozard: Treating Dawsen Krieger/Extender: Oswaldo Conroy in Treatment: 0 [name] ([id number]) [id number] Page 2 of 2 HBO Safety Checklist Items Safety Checklist Consent Form Signed Patient voided / foley secured and emptied When did you last eato 1000 Last dose of injectable or oral agent NA Ostomy pouch emptied and vented if applicable NA All implantable devices assessed, documented and approved NA Intravenous access site secured and place NA Valuables secured Linens and cotton and cotton/polyester blend (less than 51% polyester) Personal oil-based products / skin lotions / body lotions removed Wigs or hairpieces removed Smoking or tobacco materials removed Books / newspapers / magazines / loose paper removed Cologne, aftershave, perfume and deodorant removed Jewelry removed (may wrap wedding band) Make-up removed Hair care products removed Battery operated devices (external) removed Heating patches and chemical warmers removed Titanium eyewear removed NA Nail polish cured greater than 10 hours NA Casting material cured greater than 10 hours NA Hearing aids removed NA Loose dentures or partials removed NA Prosthetics have been removed NA Patient demonstrates correct use of air break device (if applicable) Patient concerns have been addressed Patient grounding bracelet on and cord attached to chamber Specifics for Inpatients (complete in  addition to above) Medication sheet sent with patient NA Intravenous medications needed or due during therapy sent with patient NA Drainage tubes (e.g. nasogastric tube or chest tube secured and vented) NA Endotracheal or Tracheotomy tube secured NA Cuff deflated of air and inflated with saline NA Airway suctioned NA Notes The safety  checklist wads dine before the treatment was started. Electronic Signature(s) Signed: 10/30/2022 2:46:50 PM By: Karl Bales EMT Entered By: Karl Bales on 10/24/2022 15:40:56

## 2022-10-31 ENCOUNTER — Encounter (HOSPITAL_BASED_OUTPATIENT_CLINIC_OR_DEPARTMENT_OTHER): Payer: Medicare Other | Admitting: Physician Assistant

## 2022-10-31 DIAGNOSIS — T86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: Secondary | ICD-10-CM | POA: Diagnosis not present

## 2022-10-31 NOTE — Progress Notes (Signed)
[name], [name] ([id number]) [id number] Page 1 of 2 Visit Report for 10/31/2022 HBO Details Patient Name: Date of Service: [name], [name] 10/31/2022 12:00 PM Medical Record Number: [id number] Patient Account Number: [id number] Date of Birth/Sex: Treating RN: 1971-04-03 (52 y.o. 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 HBO Treatment Details Treatment Number: 5 Patient Type: Outpatient Chamber Type: Monoplace Chamber Serial #: [id number] Treatment Protocol: 2.0 ATA with 90 minutes oxygen, with two 5 minute air breaks Treatment Details Compression Rate Down: 1.0 psi / minute De-Compression Rate Up: 1.5 psi / minute A breaks and breathing ir Compress Tx Pressure periods Decompress Decompress Begins Reached (leave unused spaces Begins Ends blank) Chamber Pressure (ATA 1 2 2 2 2 2  --2 1 ) Clock Time (24 hr) 12:21 12:40 13:10 13:15 13:45 13:50 - - 14:20 14:36 Treatment Length: 135 (minutes) Treatment Segments: 4 Vital Signs Capillary Blood Glucose Reference Range: 80 - 120 mg / dl HBO Diabetic Blood Glucose Intervention Range: <131 mg/dl or >086 mg/dl Type: Time Vitals Blood Pulse: Respiratory Temperature: Capillary Blood Glucose Pulse Action Taken: Pressure: Rate: Glucose (mg/dl): Meter #: Oximetry (%) Taken: Pre 11:45 142/100 67 18 98.8 asymptomatic for hypertension. Post 14:39 126/91 63 18 97.7 none per protocol Treatment Response Treatment Toleration: Well Treatment Completion Status: Treatment Completed without Adverse Event Treatment Notes [name] arrived after  appointment with ENT where she received tympanostomy tubes. PA Stone was informed and he examined patient. Her blood pressure was 142/100 mmHg. She stated that she felt fine and denied symptoms of hypertension. She prepared for treatment. Patient self-administered Afrin. After performing a safety check, she was placed in the chamber which was compressed at a rate of 1 psi/min. Ventilation rate was on full. She tolerated the treatment and subsequent decompression at the rate of 1.5 psi/min without any issues. She denied any issues with ear equalization and/or pain associated with barotrauma. She was stable upon discharge. Physician HBO Attestation: I certify that I supervised this HBO treatment in accordance with Medicare guidelines. A trained emergency response team is readily available per Yes hospital policies and procedures. Continue HBOT as ordered. Yes Electronic Signature(s) Signed: 10/31/2022 4:01:36 PM By: Duanne Guess MD FACS Previous Signature: 10/31/2022 3:40:43 PM Version By: Haywood Pao CHT EMT BS , , Entered By: Duanne Guess on 10/31/2022 16:01:36 [name] ([id number]) [id number] Page 2 of 2 -------------------------------------------------------------------------------- HBO Safety Checklist Details Patient Name: Date of Service: [name], [name] 10/31/2022 12:00 PM Medical Record Number: [id number] Patient Account Number: [id number] Date of Birth/Sex: Treating RN: Jun 29, 1970 (52 y.o. Tommye Standard Primary Care Jilberto Vanderwall: Janece Canterbury Other Clinician: Haywood Pao Referring Kamsiyochukwu Spickler: Treating Sharice Harriss/Extender: Neldon Newport in Treatment: 1 HBO Safety Checklist Items Safety Checklist Consent Form Signed Patient voided / foley secured and emptied When did you last eato 0930 Last dose of injectable or oral agent n/a Ostomy pouch emptied and vented if applicable NA All implantable devices  assessed, documented and approved NA Intravenous access site secured and place NA Valuables secured Linens and cotton and cotton/polyester blend (less than 51% polyester) Personal oil-based products / skin lotions / body lotions removed Wigs or hairpieces removed NA Smoking or tobacco materials removed NA Books / newspapers /  magazines / loose paper removed Cologne, aftershave, perfume and deodorant removed Jewelry removed (may wrap wedding band) Make-up removed Hair care products removed Battery operated devices (external) removed Heating patches and chemical warmers removed Titanium eyewear removed Nail polish cured greater than 10 hours NA Casting material cured greater than 10 hours NA Hearing aids removed NA Loose dentures or partials removed NA Prosthetics have been removed NA Patient demonstrates correct use of air break device (if applicable) Patient concerns have been addressed Patient grounding bracelet on and cord attached to chamber Specifics for Inpatients (complete in addition to above) Medication sheet sent with patient NA Intravenous medications needed or due during therapy sent with patient NA Drainage tubes (e.g. nasogastric tube or chest tube secured and vented) NA Endotracheal or Tracheotomy tube secured NA Cuff deflated of air and inflated with saline NA Airway suctioned NA Notes Paper version used prior to treatment start. Electronic Signature(s) Signed: 10/31/2022 2:55:26 PM By: Haywood Pao CHT EMT BS , , Entered By: Haywood Pao on 10/31/2022 14:55:26

## 2022-10-31 NOTE — Progress Notes (Signed)
BRESHA, VILE ([id number]) [id number] Page 1 of 1 Visit Report for 10/31/2022 SuperBill Details Patient Name: Date of Service: DALIAH, AIKIN 10/31/2022 Medical Record Number: [id number] Patient Account Number: [id number] Date of Birth/Sex: Treating RN: 08-Apr-1971 (52 y.o. 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 Facility Procedures CPT4 Code Description Modifier Quantity [id number] G0277-(Facility Use Only) HBOT full body chamber, , 4 ICD-10 Diagnosis Description 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 Disruption of external operation (surgical) wound, not elsewhere classified, subsequent encounter 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

## 2022-10-31 NOTE — Progress Notes (Signed)
ELYN, EADES ([id number]) [id number] Page 1 of 1 Visit Report for 10/31/2022 Arrival Information Details Patient Name: Date of Service: [name], [name] 10/31/2022 12:00 PM Medical Record Number: [id number] Patient Account Number: [id number] Date of Birth/Sex: Treating RN: December 14, 1970 (52 y.o. Tommye Standard Primary Care Wissam Resor: Janece Canterbury Other Clinician: Haywood Pao Referring Flemon Kelty: Treating Burton Gahan/Extender: Neldon Newport in Treatment: 1 Visit Information History Since Last Visit All ordered tests and consults were completed: Yes Patient Arrived: Ambulatory Added or deleted any medications: No Arrival Time: 11:21 Any new allergies or adverse reactions: No Accompanied By: self Had a fall or experienced change in No Transfer Assistance: None activities of daily living that may affect Patient Identification Verified: Yes risk of falls: Secondary Verification Process Completed: Yes Signs or symptoms of abuse/neglect since last visito No Patient Requires Transmission-Based Precautions: No Hospitalized since last visit: No Patient Has Alerts: No Implantable device outside of the clinic excluding No cellular tissue based products placed in the center since last visit: Pain Present Now: No Electronic Signature(s) Signed: 10/31/2022 2:51:44 PM By: Haywood Pao CHT EMT BS , , Entered By: Haywood Pao on 10/31/2022 14:51:43 -------------------------------------------------------------------------------- Vitals Details Patient Name: Date of Service: [name], [name] 10/31/2022 12:00 PM Medical Record Number: [id number] Patient Account Number: [id number] Date of Birth/Sex: Treating RN: [birth year] (52 y.o. [name], [name] Primary Care Daved Mcfann: Janece Canterbury Other Clinician: Karl Bales Referring Alice Burnside: Treating Lucrezia Dehne/Extender: Neldon Newport in Treatment:  1 Vital Signs Time Taken: 11:45 Temperature (F): 98.8 Height (in): 65 Pulse (bpm): 67 Respiratory Rate (breaths/min): 18 Blood Pressure (mmHg): 142/100 Reference Range: 80 - 120 mg / dl Electronic Signature(s) Signed: 10/31/2022 2:52:41 PM By: Haywood Pao CHT EMT BS , , Entered By: Haywood Pao on 10/31/2022 14:52:41

## 2022-11-01 ENCOUNTER — Ambulatory Visit (HOSPITAL_BASED_OUTPATIENT_CLINIC_OR_DEPARTMENT_OTHER): Payer: Medicare Other | Admitting: Internal Medicine

## 2022-11-01 ENCOUNTER — Encounter (HOSPITAL_BASED_OUTPATIENT_CLINIC_OR_DEPARTMENT_OTHER): Payer: Medicare Other | Admitting: Internal Medicine

## 2022-11-01 DIAGNOSIS — T86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: Secondary | ICD-10-CM

## 2022-11-01 DIAGNOSIS — T8131XD Disruption of external operation (surgical) wound, not elsewhere classified, subsequent encounter: Secondary | ICD-10-CM

## 2022-11-01 DIAGNOSIS — H0270 Unspecified degenerative disorders of eyelid and periocular area: Secondary | ICD-10-CM | POA: Diagnosis not present

## 2022-11-02 ENCOUNTER — Encounter (HOSPITAL_BASED_OUTPATIENT_CLINIC_OR_DEPARTMENT_OTHER): Payer: Medicare Other | Admitting: Internal Medicine

## 2022-11-02 DIAGNOSIS — T86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: Secondary | ICD-10-CM

## 2022-11-02 DIAGNOSIS — T8131XD Disruption of external operation (surgical) wound, not elsewhere classified, subsequent encounter: Secondary | ICD-10-CM

## 2022-11-02 DIAGNOSIS — H0270 Unspecified degenerative disorders of eyelid and periocular area: Secondary | ICD-10-CM

## 2022-11-02 NOTE — Progress Notes (Signed)
HADESSAH, GRENNAN ([id number]) [id number] Page 1 of 1 Visit Report for 11/02/2022 Arrival Information Details Patient Name: Date of Service: [name], [name] 11/02/2022 10:00 A M Medical Record Number: [id number] Patient Account Number: [id number] Date of Birth/Sex: Treating RN: 1970/05/25 (52 y.o. [name], [name] Primary Care [name] Other Clinician: Haywood [name] Referring [name]: Treating [name]/Extender: [name] in Treatment: 2 Visit Information History Since Last Visit All ordered tests and consults were completed: Yes Patient Arrived: Ambulatory Added or deleted any medications: No Arrival Time: 09:15 Any new allergies or adverse reactions: No Accompanied By: self Had a fall or experienced change in No Transfer Assistance: None activities of daily living that may affect Patient Identification Verified: Yes risk of falls: Secondary Verification Process Completed: Yes Signs or symptoms of abuse/neglect since last visito No Patient Requires Transmission-Based Precautions: No Hospitalized since last visit: No Patient Has Alerts: No Implantable device outside of the clinic excluding No cellular tissue based products placed in the center since last visit: Pain Present Now: No Electronic Signature(s) Signed: 11/02/2022 3:02:54 PM By: [name] CHT EMT BS , , Entered By: [name] on 11/02/2022 15:02:53 -------------------------------------------------------------------------------- Vitals Details Patient Name: Date of Service: [name], [name] 11/02/2022 10:00 A M Medical Record Number: [id number] Patient Account Number: [id number] Date of Birth/Sex: Treating RN: 03-13-1971 (52 y.o. [name], [name] Primary Care [name] Other Clinician: Haywood [name] Referring [name]: Treating [name]/Extender: [name] in Treatment:  2 Vital Signs Time Taken: 09:49 Temperature (F): 98.2 Height (in): 65 Pulse (bpm): 63 Respiratory Rate (breaths/min): 16 Blood Pressure (mmHg): 111/81 Reference Range: 80 - 120 mg / dl Electronic Signature(s) Signed: 11/02/2022 3:03:18 PM By: [name] CHT EMT BS , , Entered By: [name] on 11/02/2022 15:03:18

## 2022-11-02 NOTE — Progress Notes (Signed)
IVETTE, CASTRONOVA ([id number]) [id number] Page 1 of 1 Visit Report for 11/01/2022 SuperBill Details Patient Name: Date of Service: LYNLEIGH, KOVACK 11/01/2022 Medical Record Number: [id number] Patient Account Number: [id number] Date of Birth/Sex: Treating RN: 1970-12-22 (52 y.o. 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 Disruption of external operation (surgical) wound, not elsewhere classified, subsequent encounter 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

## 2022-11-02 NOTE — Progress Notes (Addendum)
[name] ([id number]) [id number] Page 1 of 2 Visit Report for 11/01/2022 HBO Details Patient Name: Date of Service: [name], [name] 11/01/2022 12:00 PM Medical Record Number: [id number] Patient Account Number: [id number] Date of Birth/Sex: Treating RN: 09-26-1970 (52 y.o. [name], Jamie Primary Care Buster Schueller: Janece Canterbury Other Clinician: Haywood Pao Referring Zykeem Bauserman: Treating Theotis Gerdeman/Extender: Luan Pulling in Treatment: 2 HBO Treatment Course Details Treatment Course Number: 1 Ordering Beryle Zeitz: Geralyn Corwin T Treatments Ordered: otal 20 HBO Treatment Start Date: 10/23/2022 HBO Indication: Compromised/Failed Flap/Graft to Left Forehead HBO Treatment Details Treatment Number: 6 Patient Type: Outpatient Chamber Type: Monoplace Chamber Serial #: [id number] Treatment Protocol: 2.0 ATA with 90 minutes oxygen, with two 5 minute air breaks Treatment Details Compression Rate Down: 1.5 psi / minute De-Compression Rate Up: 1.5 psi / minute A breaks and breathing ir Compress Tx Pressure periods Decompress Decompress Begins Reached (leave unused spaces Begins Ends blank) Chamber Pressure (ATA 1 2 2 2 2 2  --2 1 ) Clock Time (24 hr) 12:26 12:37 13:07 13:12 13:42 13:47 - - 14:17 14:27 Treatment Length: 121 (minutes) Treatment Segments: 4 Vital Signs Capillary Blood Glucose Reference Range: 80 - 120 mg / dl HBO Diabetic Blood Glucose Intervention Range: <131 mg/dl or >644 mg/dl Type: Time Vitals Blood Respiratory Capillary Blood Glucose Pulse Action Pulse: Temperature: Taken: Pressure: Rate: Glucose (mg/dl): Meter #: Oximetry (%) Taken: Pre 11:52 98/60 82 18 97.9 none per protocol Post 14:32 124/84 60 18 97.5 none per protocol Treatment Response Treatment Toleration: Well Treatment Completion Status: Treatment Completed without Adverse Event Treatment Notes Mrs. Marineau arrived. Her blood pressure was 98/60  mmHg. She stated that she felt fine and denied symptoms of hypotension. She prepared for treatment. Patient self-administered Afrin. After performing a safety check, she was placed in the chamber which was compressed at a rate of 1.5 psi/min after confirmation of normal ear equalization. She tolerated the treatment and subsequent decompression at the rate of 1.5 psi/min without any issues. She denied any issues with ear equalization and/or pain associated with barotrauma. She was stable upon discharge. Physician HBO Attestation: I certify that I supervised this HBO treatment in accordance with Medicare guidelines. A trained emergency response team is readily available per Yes hospital policies and procedures. Continue HBOT as ordered. Yes Electronic Signature(s) Signed: 11/02/2022 1:00:24 PM By: Geralyn Corwin DO Previous Signature: 11/01/2022 6:03:59 PM Version By: Haywood Pao CHT EMT BS , , Entered By: Geralyn Corwin on 11/02/2022 13:00:03 [name] ([id number]) [id number] Page 2 of 2 -------------------------------------------------------------------------------- HBO Safety Checklist Details Patient Name: Date of Service: [name], [name] 11/01/2022 12:00 PM Medical Record Number: [id number] Patient Account Number: [id number] Date of Birth/Sex: Treating RN: Jan 18, 1971 (52 y.o. [name], Jamie Primary Care Tyrisha Benninger: Janece Canterbury Other Clinician: Haywood Pao Referring Cartina Brousseau: Treating Naomia Lenderman/Extender: Luan Pulling in Treatment: 2 HBO Safety Checklist Items Safety Checklist Consent Form Signed Patient voided / foley secured and emptied When did you last eato 0930 Last dose of injectable or oral agent n/a Ostomy pouch emptied and vented if applicable NA All implantable devices assessed, documented and approved NA Intravenous access site secured and place NA Valuables secured Linens and cotton and  cotton/polyester blend (less than 51% polyester) Personal oil-based products / skin lotions / body lotions removed Wigs or hairpieces removed NA Smoking or tobacco materials removed NA Books / newspapers / magazines / loose paper removed Cologne, aftershave, perfume and deodorant removed Jewelry removed (may wrap wedding band)  Make-up removed Hair care products removed Battery operated devices (external) removed Heating patches and chemical warmers removed Titanium eyewear removed Nail polish cured greater than 10 hours NA Casting material cured greater than 10 hours NA Hearing aids removed NA Loose dentures or partials removed NA Prosthetics have been removed NA Patient demonstrates correct use of air break device (if applicable) Patient concerns have been addressed Patient grounding bracelet on and cord attached to chamber Specifics for Inpatients (complete in addition to above) Medication sheet sent with patient NA Intravenous medications needed or due during therapy sent with patient NA Drainage tubes (e.g. nasogastric tube or chest tube secured and vented) NA Endotracheal or Tracheotomy tube secured NA Cuff deflated of air and inflated with saline NA Airway suctioned NA Notes Paper version used prior to treatment start. Electronic Signature(s) Signed: 11/01/2022 6:00:51 PM By: Haywood Pao CHT EMT BS , , Entered By: Haywood Pao on 11/01/2022 18:00:50

## 2022-11-02 NOTE — Progress Notes (Signed)
ASHELYNN, MARKS ([id number]) [id number] Page 1 of 2 Visit Report for 11/02/2022 HBO Details Patient Name: Date of Service: [name], [name] 11/02/2022 10:00 A M Medical Record Number: [id number] Patient Account Number: [id number] Date of Birth/Sex: Treating RN: Apr 18, 1971 (52 y.o. 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 HBO Treatment Details Treatment Number: 7 Patient Type: Outpatient Chamber Type: Monoplace Chamber Serial #: [id number] Treatment Protocol: 2.0 ATA with 90 minutes oxygen, with two 5 minute air breaks Treatment Details Compression Rate Down: 1.5 psi / minute De-Compression Rate Up: 1.5 psi / minute A breaks and breathing ir Compress Tx Pressure periods Decompress Decompress Begins Reached (leave unused spaces Begins Ends blank) Chamber Pressure (ATA 1 2 2 2 2 2  --2 1 ) Clock Time (24 hr) 09:53 10:06 10:36 10:41 11:11 11:16 - - 11:46 11:55 Treatment Length: 122 (minutes) Treatment Segments: 4 Vital Signs Capillary Blood Glucose Reference Range: 80 - 120 mg / dl HBO Diabetic Blood Glucose Intervention Range: <131 mg/dl or >614 mg/dl Type: Time Vitals Blood Pulse: Respiratory Temperature: Capillary Blood Glucose Pulse Action Taken: Pressure: Rate: Glucose (mg/dl): Meter #: Oximetry (%) Taken: Pre 09:49 111/81 63 16 98.2 none per protocol Post 11:57 137/72 56 16 97.5 asymptomatic for bradycardia Treatment Response Treatment Toleration: Well Treatment Completion Status: Treatment Completed without Adverse Event Treatment Notes Mrs. Winkleman arrived with normal vital  signs. She prepared for treatment. Patient self-administered Afrin. After performing a safety check, she was placed in the chamber which was compressed at a rate of 1.5 psi/min after confirmation of normal ear equalization. She tolerated the treatment and subsequent decompression at the rate of 1.5 psi/min without any issues. She denied any issues with ear equalization and/or pain associated with barotrauma. Post- treatment vitals were normal with exception of heart rate of 56 bpm. She denied symptoms of bradycardia. She was stable upon discharge. Physician HBO Attestation: I certify that I supervised this HBO treatment in accordance with Medicare guidelines. A trained emergency response team is readily available per Yes hospital policies and procedures. Continue HBOT as ordered. Yes Electronic Signature(s) Signed: 11/05/2022 4:29:34 PM By: Geralyn Corwin DO Previous Signature: 11/02/2022 3:08:38 PM Version By: Haywood Pao CHT EMT BS , , Previous Signature: 11/02/2022 3:07:48 PM Version By: Haywood Pao CHT EMT BS , , Entered By: Geralyn Corwin on 11/05/2022 16:16:23 [name] ([id number]) [id number] Page 2 of 2 -------------------------------------------------------------------------------- HBO Safety Checklist Details Patient Name: Date of Service: [name], [name] 11/02/2022 10:00 A M Medical Record Number: [id number] Patient Account Number: [id number] Date of Birth/Sex: Treating RN: 04-19-1971 (52 y.o. [name], [name] Primary Care Marsheila Alejo: Janece Canterbury Other Clinician: Haywood Pao Referring Ersa Delaney: Treating Rollo Farquhar/Extender: Luan Pulling in Treatment: 2 HBO Safety Checklist Items Safety Checklist Consent Form Signed Patient voided / foley secured and emptied When did you last eato 0800 Last dose of injectable or oral agent n/a Ostomy pouch emptied and vented if applicable NA All implantable  devices assessed, documented and approved NA Intravenous access site secured and place NA Valuables secured Linens and cotton and cotton/polyester blend (less than 51% polyester) Personal oil-based products / skin lotions / body lotions removed Wigs or hairpieces removed NA Smoking or tobacco materials removed NA  Books / newspapers / magazines / loose paper removed Cologne, aftershave, perfume and deodorant removed Jewelry removed (may wrap wedding band) Make-up removed Hair care products removed Battery operated devices (external) removed Heating patches and chemical warmers removed Titanium eyewear removed Nail polish cured greater than 10 hours NA Casting material cured greater than 10 hours NA Hearing aids removed NA Loose dentures or partials removed NA Prosthetics have been removed NA Patient demonstrates correct use of air break device (if applicable) Patient concerns have been addressed Patient grounding bracelet on and cord attached to chamber Specifics for Inpatients (complete in addition to above) Medication sheet sent with patient NA Intravenous medications needed or due during therapy sent with patient NA Drainage tubes (e.g. nasogastric tube or chest tube secured and vented) NA Endotracheal or Tracheotomy tube secured NA Cuff deflated of air and inflated with saline NA Airway suctioned NA Notes Paper version used prior to treatment start. Electronic Signature(s) Signed: 11/02/2022 3:05:28 PM By: Haywood Pao CHT EMT BS , , Entered By: Haywood Pao on 11/02/2022 15:05:28

## 2022-11-02 NOTE — Progress Notes (Signed)
VALTA, DILLON ([id number]) [id number] Page 1 of 2 Visit Report for 11/01/2022 Arrival Information Details Patient Name: Date of Service: [name], [name] 11/01/2022 12:00 PM Medical Record Number: [id number] Patient Account Number: [id number] Date of Birth/Sex: Treating RN: 12-14-1970 (52 y.o. [name], [name] Primary Care Anjelique Makar: Janece Canterbury Other Clinician: Haywood Pao Referring Keysi Oelkers: Treating Galina Haddox/Extender: Luan Pulling in Treatment: 2 Visit Information History Since Last Visit All ordered tests and consults were completed: Yes Patient Arrived: Ambulatory Added or deleted any medications: No Arrival Time: 11:32 Any new allergies or adverse reactions: No Accompanied By: friend/family Had a fall or experienced change in No Transfer Assistance: None activities of daily living that may affect Patient Identification Verified: Yes risk of falls: Secondary Verification Process Completed: Yes Signs or symptoms of abuse/neglect since last visito No Patient Requires Transmission-Based Precautions: No Hospitalized since last visit: No Patient Has Alerts: No Implantable device outside of the clinic excluding No cellular tissue based products placed in the center since last visit: Pain Present Now: No Electronic Signature(s) Signed: 11/01/2022 5:56:42 PM By: Haywood Pao CHT EMT BS , , Entered By: Haywood Pao on 11/01/2022 17:56:41 -------------------------------------------------------------------------------- Encounter Discharge Information Details Patient Name: Date of Service: [name], [name] 11/01/2022 12:00 PM Medical Record Number: [id number] Patient Account Number: [id number] Date of Birth/Sex: Treating RN: [birth year] (52 y.o. [name] Primary Care Tiaja [name]: Janece Canterbury Other Clinician: Haywood Pao Referring Amdrew Oboyle: Treating Raylene Carmickle/Extender: Luan Pulling in Treatment: 2 Encounter Discharge Information Items Discharge Condition: Stable Ambulatory Status: Ambulatory Discharge Destination: Home Transportation: Private Auto Accompanied By: friend Schedule Follow-up Appointment: No Clinical Summary of Care: Electronic Signature(s) Signed: 11/01/2022 6:04:58 PM By: Haywood Pao CHT EMT BS , , Entered By: Haywood Pao on 11/01/2022 18:04:58 [name] ([id number]) [id number] Page 2 of 2 -------------------------------------------------------------------------------- Vitals Details Patient Name: Date of Service: [name], [name] 11/01/2022 12:00 PM Medical Record Number: [id number] Patient Account Number: [id number] Date of Birth/Sex: Treating RN: 1971-03-25 (52 y.o. [name], [name] Primary Care Alvie Fowles: Janece Canterbury Other Clinician: Haywood Pao Referring Eurika Sandy: Treating Emelina Hinch/Extender: Luan Pulling in Treatment: 2 Vital Signs Time Taken: 14:32 Temperature (F): 97.5 Height (in): 65 Pulse (bpm): 82 Respiratory Rate (breaths/min): 18 Blood Pressure (mmHg): 98/60 Reference Range: 80 - 120 mg / dl Electronic Signature(s) Signed: 11/01/2022 5:59:17 PM By: Haywood Pao CHT EMT BS , , Entered By: Haywood Pao on 11/01/2022 17:59:17

## 2022-11-05 ENCOUNTER — Encounter (HOSPITAL_BASED_OUTPATIENT_CLINIC_OR_DEPARTMENT_OTHER): Payer: Medicare Other | Admitting: Internal Medicine

## 2022-11-05 DIAGNOSIS — H0270 Unspecified degenerative disorders of eyelid and periocular area: Secondary | ICD-10-CM

## 2022-11-05 DIAGNOSIS — T8131XD Disruption of external operation (surgical) wound, not elsewhere classified, subsequent encounter: Secondary | ICD-10-CM

## 2022-11-05 DIAGNOSIS — T86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: Secondary | ICD-10-CM

## 2022-11-05 NOTE — Progress Notes (Signed)
NEMA, OATLEY ([id number]) [id number] Page 1 of 1 Visit Report for 11/02/2022 SuperBill Details Patient Name: Date of Service: [name], [name] 11/02/2022 Medical Record Number: [id number] Patient Account Number: [id number] Date of Birth/Sex: Treating RN: February 21, 1971 (52 y.o. 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 Facility Procedures CPT4 Code Description Modifier Quantity [id number] G0277-(Facility Use Only) HBOT full body chamber, , 4 ICD-10 Diagnosis Description 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 Disruption of external operation (surgical) wound, not elsewhere classified, subsequent encounter 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

## 2022-11-05 NOTE — Progress Notes (Signed)
[name], [name] ([id number]) [id number] Page 1 of 2 Visit Report for 11/05/2022 Problem List Details Patient Name: Date of Service: [name], [name] 11/05/2022 12:00 PM Medical Record Number: [id number] Patient Account Number: [id number] Date of Birth/Sex: Treating RN: 1970-12-22 (52 y.o. 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 Facility Procedures RASHEA, HOSKIE ([id number]): CPT4 Code Description [id number]  G0277-(Facility Use Only) HBOT full body chamber, , ICD-10 Diagnosis Description 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 Disruption of external operation (surgical)  wound, not elsewhere clas 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 [id number] Page 2 of 2: Modifier Quantity 4 sified, subsequent encounter Physician Procedures : CPT4 Code Description Modifier [phone] - WC PHYS HYPERBARIC OXYGEN THERAPY ICD-10 Diagnosis Description 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

## 2022-11-05 NOTE — Progress Notes (Addendum)
[name], [name] ([id number]) [id number] Page 1 of 2 Visit Report for 11/05/2022 HBO Details Patient Name: Date of Service: [name], [name] 11/05/2022 12:00 PM Medical Record Number: [id number] Patient Account Number: [id number] Date of Birth/Sex: Treating RN: 13-Oct-1970 (52 y.o. [name], [name] Primary Care Ilah Boule: Janece Canterbury Other Clinician: Shawn Stall Referring Shiva Karis: Treating Acelin Ferdig/Extender: Luan Pulling in Treatment: 2 HBO Treatment Course Details Treatment Course Number: 1 Ordering Sunny Aguon: Geralyn Corwin T Treatments Ordered: otal 20 HBO Treatment Start Date: 10/23/2022 HBO Indication: Compromised/Failed Flap/Graft to Left Forehead HBO Treatment Details Treatment Number: 8 Patient Type: Outpatient Chamber Type: Monoplace Chamber Serial #: [id number] Treatment Protocol: 2.0 ATA with 90 minutes oxygen, with two 5 minute air breaks Treatment Details Compression Rate Down: 2.0 psi / minute De-Compression Rate Up: 2.0 psi / minute A breaks and breathing ir Compress Tx Pressure periods Decompress Decompress Begins Reached (leave unused spaces Begins Ends blank) Chamber Pressure (ATA 1 2 2 2 2 2  --2 1 ) Clock Time (24 hr) 11:08 11:17 11:47 11:52 12:22 12:27 - - 12:57 13:05 Treatment Length: 117 (minutes) Treatment Segments: 4 Vital Signs Capillary Blood Glucose Reference Range: 80 - 120 mg / dl HBO Diabetic Blood Glucose Intervention Range: <131 mg/dl or >308 mg/dl Time Vitals Blood Respiratory Capillary Blood Glucose Pulse Action Type: Pulse: Temperature: Taken: Pressure: Rate: Glucose (mg/dl): Meter #: Oximetry (%) Taken: Pre 10:49 115/74 83 18 98.4 Post 13:08 118/73 77 18 98.2 Treatment Response Treatment Toleration: Well Treatment Completion Status: Treatment Completed without Adverse Event Physician HBO Attestation: I certify that I supervised this HBO treatment in accordance with  Medicare guidelines. A trained emergency response team is readily available per Yes hospital policies and procedures. Continue HBOT as ordered. Yes Electronic Signature(s) Signed: 11/05/2022 4:31:37 PM By: Geralyn Corwin DO Previous Signature: 11/05/2022 2:16:28 PM Version By: Karl Bales EMT Previous Signature: 11/05/2022 4:29:34 PM Version By: Geralyn Corwin DO Entered By: Geralyn Corwin on 11/05/2022 16:30:44 [name] ([id number]) [id number] Page 2 of 2 -------------------------------------------------------------------------------- HBO Safety Checklist Details Patient Name: Date of Service: [name], [name] 11/05/2022 12:00 PM Medical Record Number: [id number] Patient Account Number: [id number] Date of Birth/Sex: Treating RN: March 07, 1971 (52 y.o. [name], [name] Primary Care Anarosa Kubisiak: Janece Canterbury Other Clinician: Shawn Stall Referring Fina Heizer: Treating Hiro Vipond/Extender: Luan Pulling in Treatment: 2 HBO Safety Checklist Items Safety Checklist Consent Form Signed Patient voided / foley secured and emptied When did you last eato 1000 Last dose of injectable or oral agent NA Ostomy pouch emptied and vented if applicable NA All implantable devices assessed, documented and approved NA Intravenous access site secured and place NA Valuables secured Linens and cotton and cotton/polyester blend (less than 51% polyester) Personal oil-based products / skin lotions / body lotions removed Wigs or hairpieces removed Smoking or tobacco materials removed Books / newspapers / magazines / loose paper removed Cologne, aftershave, perfume and deodorant removed Jewelry removed (may wrap wedding band) Make-up removed Hair care products removed Battery operated devices (external) removed Heating patches and chemical warmers removed Titanium eyewear removed NA Nail polish cured greater than 10 hours NA Casting material  cured greater than 10 hours NA Hearing aids removed NA Loose dentures or partials removed NA Prosthetics have been removed NA Patient demonstrates correct use of air break device (if applicable) Patient concerns have been addressed Patient grounding bracelet on and cord attached to chamber Specifics for Inpatients (complete in addition to above) Medication sheet sent with patient NA  Intravenous medications needed or due during therapy sent with patient NA Drainage tubes (e.g. nasogastric tube or chest tube secured and vented) NA Endotracheal or Tracheotomy tube secured NA Cuff deflated of air and inflated with saline NA Airway suctioned NA Notes The safety checklist was done before the treatment was started. Electronic Signature(s) Signed: 11/05/2022 2:14:06 PM By: Karl Bales EMT Entered By: Karl Bales on 11/05/2022 14:14:06

## 2022-11-05 NOTE — Progress Notes (Signed)
PEARLY, BARTOSIK ([id number]) [id number] Page 1 of 2 Visit Report for 11/05/2022 Arrival Information Details Patient Name: Date of Service: RACHELANNE, WHIDBY 11/05/2022 12:00 PM Medical Record Number: [id number] Patient Account Number: [id number] Date of Birth/Sex: Treating RN: 29-Apr-1971 (52 y.o. [name], [name] Primary Care Traveon Louro: Janece Canterbury Other Clinician: Shawn Stall Referring Ayisha Pol: Treating Kimari Lienhard/Extender: Luan Pulling in Treatment: 2 Visit Information History Since Last Visit All ordered tests and consults were completed: Yes Patient Arrived: Ambulatory Added or deleted any medications: No Arrival Time: 10:42 Any new allergies or adverse reactions: No Accompanied By: Friend Had a fall or experienced change in No Transfer Assistance: None activities of daily living that may affect Patient Identification Verified: Yes risk of falls: Secondary Verification Process Completed: Yes Signs or symptoms of abuse/neglect since last visito No Patient Requires Transmission-Based Precautions: No Hospitalized since last visit: No Patient Has Alerts: No Implantable device outside of the clinic excluding No cellular tissue based products placed in the center since last visit: Pain Present Now: No Electronic Signature(s) Signed: 11/05/2022 2:12:21 PM By: Karl Bales EMT Entered By: Karl Bales on 11/05/2022 14:12:21 -------------------------------------------------------------------------------- Encounter Discharge Information Details Patient Name: Date of Service: [name], Kriya 11/05/2022 12:00 PM Medical Record Number: [id number] Patient Account Number: [id number] Date of Birth/Sex: Treating RN: [birth year] (52 y.o. [name], [name] Primary Care Salam Micucci: Janece Canterbury Other Clinician: Shawn Stall Referring Kentavious Michele: Treating Kaicen Desena/Extender: Luan Pulling in Treatment:  2 Encounter Discharge Information Items Discharge Condition: Stable Ambulatory Status: Ambulatory Discharge Destination: Home Transportation: Private Auto Accompanied By: Clearence Cheek Schedule Follow-up Appointment: No Clinical Summary of Care: Electronic Signature(s) Signed: 11/05/2022 2:17:47 PM By: Karl Bales EMT Entered By: Karl Bales on 11/05/2022 14:17:47 [name] ([id number]) [id number] Page 2 of 2 -------------------------------------------------------------------------------- Vitals Details Patient Name: Date of Service: ANNAIS, CRAFTS 11/05/2022 12:00 PM Medical Record Number: [id number] Patient Account Number: [id number] Date of Birth/Sex: Treating RN: 1970-08-22 (53 y.o. [name], [name] Primary Care Juley Giovanetti: Janece Canterbury Other Clinician: Shawn Stall Referring Micholas Drumwright: Treating Gale Hulse/Extender: Luan Pulling in Treatment: 2 Vital Signs Time Taken: 10:49 Temperature (F): 98.4 Height (in): 65 Pulse (bpm): 83 Respiratory Rate (breaths/min): 18 Blood Pressure (mmHg): 115/74 Reference Range: 80 - 120 mg / dl Electronic Signature(s) Signed: 11/05/2022 2:13:11 PM By: Karl Bales EMT Entered By: Karl Bales on 11/05/2022 14:13:11

## 2022-11-06 ENCOUNTER — Encounter (HOSPITAL_BASED_OUTPATIENT_CLINIC_OR_DEPARTMENT_OTHER): Payer: Medicare Other | Admitting: Internal Medicine

## 2022-11-06 DIAGNOSIS — T8131XD Disruption of external operation (surgical) wound, not elsewhere classified, subsequent encounter: Secondary | ICD-10-CM

## 2022-11-06 DIAGNOSIS — T86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: Secondary | ICD-10-CM

## 2022-11-06 DIAGNOSIS — H0270 Unspecified degenerative disorders of eyelid and periocular area: Secondary | ICD-10-CM

## 2022-11-06 NOTE — Progress Notes (Signed)
TIMBERLYN, PICKFORD ([id number]) [id number] Page 1 of 2 Visit Report for 11/06/2022 Problem List Details Patient Name: Date of Service: [name], [name] 11/06/2022 9:00 A M Medical Record Number: [id number] Patient Account Number: [id number] Date of Birth/Sex: Treating RN: 1970-08-07 (52 y.o. 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 Facility Procedures ARLETA, OSTRUM ([id number]): CPT4 Code Description  [id number] G0277-(Facility Use Only) HBOT full body chamber, , ICD-10 Diagnosis Description 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 Disruption of external operation (surgical)  wound, not elsewhere clas 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 [id number] Page 2 of 2: Modifier Quantity 4 sified, subsequent encounter Physician Procedures : CPT4 Code Description Modifier [phone] - WC PHYS HYPERBARIC OXYGEN THERAPY ICD-10 Diagnosis Description 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

## 2022-11-06 NOTE — Progress Notes (Signed)
[name], [name] ([id number]) [id number] Page 1 of 2 Visit Report for 11/06/2022 Arrival Information Details Patient Name: Date of Service: [name], [name] 11/06/2022 9:00 A M Medical Record Number: [id number] Patient Account Number: [id number] Date of Birth/Sex: Treating RN: 1970/12/16 (52 y.o. [name] Primary Care Anyiah Coverdale: Janece Canterbury Other Clinician: Karl Bales Referring Noland Pizano: Treating Raheen Capili/Extender: Luan Pulling in Treatment: 2 Visit Information History Since Last Visit All ordered tests and consults were completed: Yes Patient Arrived: Ambulatory Added or deleted any medications: No Arrival Time: 09:19 Any new allergies or adverse reactions: No Accompanied By: Friend Had a fall or experienced change in No Transfer Assistance: None activities of daily living that may affect Patient Identification Verified: Yes risk of falls: Secondary Verification Process Completed: Yes Signs or symptoms of abuse/neglect since last visito No Patient Requires Transmission-Based Precautions: No Hospitalized since last visit: No Patient Has Alerts: No Implantable device outside of the clinic excluding No cellular tissue based products placed in the center since last visit: Pain Present Now: No Electronic Signature(s) Signed: 11/06/2022 1:43:55 PM By: Karl Bales EMT Entered By: Karl Bales on 11/06/2022 13:43:55 -------------------------------------------------------------------------------- Encounter Discharge Information Details Patient Name: Date of Service: [name], [name] 11/06/2022 9:00 A M Medical Record Number: [id number] Patient Account Number: [id number] Date of Birth/Sex: Treating RN: 11-30-1970 (52 y.o. [name] Primary Care Tanis Burnley: Janece Canterbury Other Clinician: Karl Bales Referring Monette Omara: Treating Tamaira Ciriello/Extender: Luan Pulling in Treatment:  2 Encounter Discharge Information Items Discharge Condition: Stable Ambulatory Status: Ambulatory Discharge Destination: Home Transportation: Private Auto Accompanied By: Clearence Cheek Schedule Follow-up Appointment: Yes Clinical Summary of Care: Electronic Signature(s) Signed: 11/06/2022 1:48:43 PM By: Karl Bales EMT Entered By: Karl Bales on 11/06/2022 13:48:43 [name] ([id number]) [id number] Page 2 of 2 -------------------------------------------------------------------------------- Vitals Details Patient Name: Date of Service: [name], [name] 11/06/2022 9:00 A M Medical Record Number: [id number] Patient Account Number: [id number] Date of Birth/Sex: Treating RN: Apr 05, 1971 (52 y.o. [name] Primary Care Eve Rey: Janece Canterbury Other Clinician: Karl Bales Referring Ingram Onnen: Treating Latrice Storlie/Extender: Luan Pulling in Treatment: 2 Vital Signs Time Taken: 09:34 Temperature (F): 97.3 Height (in): 65 Pulse (bpm): 78 Respiratory Rate (breaths/min): 18 Blood Pressure (mmHg): 115/79 Reference Range: 80 - 120 mg / dl Electronic Signature(s) Signed: 11/06/2022 1:44:25 PM By: Karl Bales EMT Entered By: Karl Bales on 11/06/2022 13:44:25

## 2022-11-06 NOTE — Progress Notes (Addendum)
OCEANNA, ARRUDA ([id number]) [id number] Page 1 of 2 Visit Report for 11/06/2022 HBO Details Patient Name: Date of Service: [name], [name] 11/06/2022 9:00 A M Medical Record Number: [id number] Patient Account Number: [id number] Date of Birth/Sex: Treating RN: [birth year] (52 y.o. [name] Primary Care Chun Sellen: Janece Canterbury Other Clinician: Karl Bales Referring Marvin Maenza: Treating Mirabella Hilario/Extender: Luan Pulling in Treatment: 2 HBO Treatment Course Details Treatment Course Number: 1 Ordering Kazmir Oki: Geralyn Corwin T Treatments Ordered: otal 20 HBO Treatment Start Date: 10/23/2022 HBO Indication: Compromised/Failed Flap/Graft to Left Forehead HBO Treatment Details Treatment Number: 9 Patient Type: Outpatient Chamber Type: Monoplace Chamber Serial #: [id number] Treatment Protocol: 2.0 ATA with 90 minutes oxygen, with two 5 minute air breaks Treatment Details Compression Rate Down: 2.0 psi / minute De-Compression Rate Up: 2.0 psi / minute A breaks and breathing ir Compress Tx Pressure periods Decompress Decompress Begins Reached (leave unused spaces Begins Ends blank) Chamber Pressure (ATA 1 2 2 2 2 2  --2 1 ) Clock Time (24 hr) 09:40 09:48 10:18 10:23 10:53 10:58 - - 11:28 11:36 Treatment Length: 116 (minutes) Treatment Segments: 4 Vital Signs Capillary Blood Glucose Reference Range: 80 - 120 mg / dl HBO Diabetic Blood Glucose Intervention Range: <131 mg/dl or >132 mg/dl Time Vitals Blood Respiratory Capillary Blood Glucose Pulse Action Type: Pulse: Temperature: Taken: Pressure: Rate: Glucose (mg/dl): Meter #: Oximetry (%) Taken: Pre 09:34 115/79 78 18 97.3 Post 11:39 111/76 66 20 97.2 Treatment Response Treatment Toleration: Well Treatment Completion Status: Treatment Completed without Adverse Event Physician HBO Attestation: I certify that I supervised this HBO treatment in accordance with  Medicare guidelines. A trained emergency response team is readily available per Yes hospital policies and procedures. Continue HBOT as ordered. Yes Electronic Signature(s) Signed: 11/06/2022 4:25:00 PM By: Geralyn Corwin DO Previous Signature: 11/06/2022 1:47:25 PM Version By: Karl Bales EMT Entered By: Geralyn Corwin on 11/06/2022 16:15:43 HBO Safety Checklist Details -------------------------------------------------------------------------------- [name] ([id number]) [id number] Page 2 of 2 Patient Name: Date of Service: [name], [name] 11/06/2022 9:00 A M Medical Record Number: [id number] Patient Account Number: [id number] Date of Birth/Sex: Treating RN: 1970/10/31 (52 y.o. [name] Primary Care Norena Bratton: Janece Canterbury Other Clinician: Karl Bales Referring Dolly Harbach: Treating Fabrizio Filip/Extender: Luan Pulling in Treatment: 2 HBO Safety Checklist Items Safety Checklist Consent Form Signed Patient voided / foley secured and emptied When did you last eato 0730 Last dose of injectable or oral agent NA Ostomy pouch emptied and vented if applicable NA All implantable devices assessed, documented and approved NA Intravenous access site secured and place NA Valuables secured Linens and cotton and cotton/polyester blend (less than 51% polyester) Personal oil-based products / skin lotions / body lotions removed Wigs or hairpieces removed Smoking or tobacco materials removed Books / newspapers / magazines / loose paper removed Cologne, aftershave, perfume and deodorant removed Jewelry removed (may wrap wedding band) Make-up removed Hair care products removed Battery operated devices (external) removed Heating patches and chemical warmers removed Titanium eyewear removed NA Nail polish cured greater than 10 hours NA Casting material cured greater than 10 hours NA Hearing aids removed NA Loose  dentures or partials removed NA Prosthetics have been removed NA Patient demonstrates correct use of air break device (if applicable) Patient concerns have been addressed Patient grounding bracelet on and cord attached to chamber Specifics for Inpatients (complete in addition to above) Medication sheet sent with patient NA Intravenous medications needed or due during therapy sent  with patient NA Drainage tubes (e.g. nasogastric tube or chest tube secured and vented) NA Endotracheal or Tracheotomy tube secured NA Cuff deflated of air and inflated with saline NA Airway suctioned NA Notes The safety checklist was done before the treatment was started. Electronic Signature(s) Signed: 11/06/2022 1:46:03 PM By: Karl Bales EMT Entered By: Karl Bales on 11/06/2022 13:46:03

## 2022-11-07 ENCOUNTER — Encounter (HOSPITAL_BASED_OUTPATIENT_CLINIC_OR_DEPARTMENT_OTHER): Payer: Medicare Other | Admitting: Physician Assistant

## 2022-11-07 DIAGNOSIS — T86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: Secondary | ICD-10-CM | POA: Diagnosis not present

## 2022-11-07 NOTE — Progress Notes (Addendum)
[name], [name] ([id number]) [id number] Page 1 of 2 Visit Report for 11/07/2022 Arrival Information Details Patient Name: Date of Service: [name], [name] 11/07/2022 12:00 PM Medical Record Number: [id number] Patient Account Number: [id number] Date of Birth/Sex: Treating RN: [birth year] (52 y.o. [name] Primary Care Yusuf Yu: Janece Canterbury Other Clinician: Haywood Pao Referring Reannon Candella: Treating Zannah Melucci/Extender: Oswaldo Conroy in Treatment: 2 Visit Information History Since Last Visit All ordered tests and consults were completed: Yes Patient Arrived: Ambulatory Added or deleted any medications: No Arrival Time: 12:26 Any new allergies or adverse reactions: No Accompanied By: friend Had a fall or experienced change in No Transfer Assistance: None activities of daily living that may affect Patient Identification Verified: Yes risk of falls: Secondary Verification Process Completed: Yes Signs or symptoms of abuse/neglect since last visito No Patient Requires Transmission-Based Precautions: No Hospitalized since last visit: No Patient Has Alerts: No Implantable device outside of the clinic excluding No cellular tissue based products placed in the center since last visit: Pain Present Now: No Electronic Signature(s) Signed: 11/07/2022 1:24:13 PM By: Haywood Pao CHT EMT BS , , Entered By: Haywood Pao on 11/07/2022 13:24:13 -------------------------------------------------------------------------------- Encounter Discharge Information Details Patient Name: Date of Service: [name], [name] 11/07/2022 12:00 PM Medical Record Number: [id number] Patient Account Number: [id number] Date of Birth/Sex: Treating RN: Dec 07, 1970 (52 y.o. [name] Primary Care Kenlyn Lose: Janece Canterbury Other Clinician: Haywood Pao Referring Eren Ryser: Treating Jamyia Fortune/Extender: Oswaldo Conroy in Treatment: 2 Encounter Discharge Information Items Discharge Condition: Stable Ambulatory Status: Ambulatory Discharge Destination: Home Transportation: Private Auto Accompanied By: friend Schedule Follow-up Appointment: No Clinical Summary of Care: Electronic Signature(s) Signed: 11/07/2022 3:43:45 PM By: Haywood Pao CHT EMT BS , , Entered By: Haywood Pao on 11/07/2022 15:43:45 [name] ([id number]) [id number] Page 2 of 2 -------------------------------------------------------------------------------- Vitals Details Patient Name: Date of Service: [name], [name] 11/07/2022 12:00 PM Medical Record Number: [id number] Patient Account Number: [id number] Date of Birth/Sex: Treating RN: [birth year] (52 y.o. [name] Primary Care Kipling Graser: Janece Canterbury Other Clinician: Haywood Pao Referring Cailie Bosshart: Treating Lyndal Reggio/Extender: Oswaldo Conroy in Treatment: 2 Vital Signs Time Taken: 12:26 Temperature (F): 98.1 Height (in): 65 Pulse (bpm): 58 Respiratory Rate (breaths/min): 18 Blood Pressure (mmHg): 114/77 Reference Range: 80 - 120 mg / dl Electronic Signature(s) Signed: 11/07/2022 1:25:03 PM By: Haywood Pao CHT EMT BS , , Previous Signature: 11/07/2022 1:24:47 PM Version By: Haywood Pao CHT EMT BS , , Entered By: Haywood Pao on 11/07/2022 13:25:03

## 2022-11-07 NOTE — Progress Notes (Addendum)
[name] ([id number]) [id number] Page 1 of 2 Visit Report for 11/07/2022 HBO Details Patient Name: Date of Service: [name], [name] 11/07/2022 12:00 PM Medical Record Number: [id number] Patient Account Number: [id number] Date of Birth/Sex: Treating RN: Sep 16, 1970 (52 y.o. [name] Primary Care Koben Daman: Janece Canterbury Other Clinician: Haywood Pao Referring Calli Bashor: Treating Konstantina Nachreiner/Extender: Oswaldo Conroy in Treatment: 2 HBO Treatment Course Details Treatment Course Number: 1 Ordering Sevanna Ballengee: Geralyn Corwin T Treatments Ordered: otal 20 HBO Treatment Start Date: 10/23/2022 HBO Indication: Compromised/Failed Flap/Graft to Left Forehead HBO Treatment Details Treatment Number: 10 Patient Type: Outpatient Chamber Type: Monoplace Chamber Serial #: [id number] Treatment Protocol: 2.0 ATA with 90 minutes oxygen, with two 5 minute air breaks Treatment Details Compression Rate Down: 1.5 psi / minute De-Compression Rate Up: A breaks and breathing ir Compress Tx Pressure periods Decompress Decompress Begins Reached (leave unused spaces Begins Ends blank) Chamber Pressure (ATA 1 2 2 2 2 2  --2 1 ) Clock Time (24 hr) 12:34 12:44 13:14 13:19 13:49 13:54 - - 14:24 14:32 Treatment Length: 118 (minutes) Treatment Segments: 4 Vital Signs Capillary Blood Glucose Reference Range: 80 - 120 mg / dl HBO Diabetic Blood Glucose Intervention Range: <131 mg/dl or >034 mg/dl Type: Time Vitals Blood Pulse: Respiratory Temperature: Capillary Blood Glucose Pulse Action Taken: Pressure: Rate: Glucose (mg/dl): Meter #: Oximetry (%) Taken: Pre 12:26 114/77 58 18 98.1 asymptomatic for bradycardia Post 14:35 121/104 62 18 97.6 re-measure BP Post 14:38 138/92 manual BP taken Treatment Response Treatment Toleration: Well Treatment Completion Status: Treatment Completed without Adverse Event Treatment Notes [name] arrived  with normal vital signs except for her heart rate of 58 bpm. She denied any symptoms related to bradycardia. After performing a safety check, she was placed in the chamber which was compressed at a rate of 2 psi/min after confirming normal ear equalization. She tolerated the treatment and then the decompression of the chamber at a rate of 2 psi/min. She denied any issues with ear equalization and/or barotrauma pain. Her post-treatment diastolic pressure was elevated. Re-measured BP with manual cuff to verify BP. She was stable upon discharge, traveling with her friend home. Electronic Signature(s) Signed: 11/07/2022 3:42:57 PM By: Haywood Pao CHT EMT BS , , Signed: 11/07/2022 4:15:09 PM By: Allen Derry PA-C Previous Signature: 11/07/2022 1:30:45 PM Version By: Haywood Pao CHT EMT BS , , Entered By: Haywood Pao on 11/07/2022 15:42:57 [name] ([id number]) [id number] Page 2 of 2 -------------------------------------------------------------------------------- HBO Safety Checklist Details Patient Name: Date of Service: [name] 11/07/2022 12:00 PM Medical Record Number: [id number] Patient Account Number: [id number] Date of Birth/Sex: Treating RN: 1970-08-05 (52 y.o. [name] Primary Care Shaydon Lease: Janece Canterbury Other Clinician: Haywood Pao Referring Jordayn Mink: Treating Jarielys Girardot/Extender: Oswaldo Conroy in Treatment: 2 HBO Safety Checklist Items Safety Checklist Consent Form Signed Patient voided / foley secured and emptied When did you last eato 0900 Last dose of injectable or oral agent n/a Ostomy pouch emptied and vented if applicable NA All implantable devices assessed, documented and approved NA Intravenous access site secured and place NA Valuables secured Linens and cotton and cotton/polyester blend (less than 51% polyester) Personal oil-based products / skin lotions / body lotions  removed Wigs or hairpieces removed NA Smoking or tobacco materials removed NA Books / newspapers / magazines / loose paper removed Cologne, aftershave, perfume and deodorant removed Jewelry removed (may wrap wedding band) Make-up removed Hair care products removed Battery operated  devices (external) removed Heating patches and chemical warmers removed Titanium eyewear removed Nail polish cured greater than 10 hours NA Casting material cured greater than 10 hours NA Hearing aids removed NA Loose dentures or partials removed NA Prosthetics have been removed NA Patient demonstrates correct use of air break device (if applicable) Patient concerns have been addressed Patient grounding bracelet on and cord attached to chamber Specifics for Inpatients (complete in addition to above) Medication sheet sent with patient NA Intravenous medications needed or due during therapy sent with patient NA Drainage tubes (e.g. nasogastric tube or chest tube secured and vented) NA Endotracheal or Tracheotomy tube secured NA Cuff deflated of air and inflated with saline NA Airway suctioned NA Notes Paper version used prior to treatment start. Electronic Signature(s) Signed: 11/07/2022 1:26:20 PM By: Haywood Pao CHT EMT BS , , Entered By: Haywood Pao on 11/07/2022 13:26:20

## 2022-11-07 NOTE — Progress Notes (Signed)
MARGARITA, CROKE ([id number]) [id number] Page 1 of 1 Visit Report for 11/07/2022 SuperBill Details Patient Name: Date of Service: [name], [name] 11/07/2022 Medical Record Number: [id number] Patient Account Number: [id number] Date of Birth/Sex: Treating RN: 05/09/1971 (52 y.o. 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 Facility Procedures CPT4 Code Description Modifier Quantity [id number] G0277-(Facility Use Only) HBOT full body chamber, , 4 ICD-10 Diagnosis Description 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 Disruption of external operation (surgical) wound, not elsewhere classified, subsequent encounter 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

## 2022-11-08 ENCOUNTER — Encounter (HOSPITAL_BASED_OUTPATIENT_CLINIC_OR_DEPARTMENT_OTHER): Payer: Medicare Other | Admitting: Internal Medicine

## 2022-11-08 DIAGNOSIS — T8131XD Disruption of external operation (surgical) wound, not elsewhere classified, subsequent encounter: Secondary | ICD-10-CM | POA: Diagnosis not present

## 2022-11-08 DIAGNOSIS — H0270 Unspecified degenerative disorders of eyelid and periocular area: Secondary | ICD-10-CM | POA: Diagnosis not present

## 2022-11-08 DIAGNOSIS — T86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: Secondary | ICD-10-CM | POA: Diagnosis not present

## 2022-11-08 NOTE — Progress Notes (Signed)
[name] ([id number]) [id number] Page 1 of 2 Visit Report for 11/08/2022 Arrival Information Details Patient Name: Date of Service: [name], [name] 11/08/2022 1:45 PM Medical Record Number: [id number] Patient Account Number: [id number] Date of Birth/Sex: Treating RN: 01-12-1971 (52 y.o. [name] Primary Care [name] Other Clinician: Karl [name] Referring [name]: Treating [name]/Extender: [name] in Treatment: 3 Visit Information History Since Last Visit All ordered tests and consults were completed: Yes Patient Arrived: Ambulatory Added or deleted any medications: No Arrival Time: 12:58 Any new allergies or adverse reactions: No Accompanied By: Friend Had a fall or experienced change in No Transfer Assistance: None activities of daily living that may affect Patient Identification Verified: Yes risk of falls: Secondary Verification Process Completed: Yes Signs or symptoms of abuse/neglect since last visito No Patient Requires Transmission-Based Precautions: No Hospitalized since last visit: No Patient Has Alerts: No Implantable device outside of the clinic excluding No cellular tissue based products placed in the center since last visit: Pain Present Now: No Electronic Signature(s) Signed: 11/08/2022 4:10:18 PM By: [name] EMT Previous Signature: 11/08/2022 4:03:30 PM Version By: [name] EMT Entered By: [name] on 11/08/2022 16:10:18 -------------------------------------------------------------------------------- Encounter Discharge Information Details Patient Name: Date of Service: [name], [name] 11/08/2022 1:45 PM Medical Record Number: [id number] Patient Account Number: [id number] Date of Birth/Sex: Treating RN: 20-Nov-1970 (52 y.o. [name] Primary Care [name] Other Clinician: Karl [name] Referring [name]: Treating  [name]/Extender: [name] in Treatment: 3 Encounter Discharge Information Items Discharge Condition: Stable Ambulatory Status: Ambulatory Discharge Destination: Home Transportation: Private Auto Accompanied By: [name] Schedule Follow-up Appointment: Yes Clinical Summary of Care: Electronic Signature(s) Signed: 11/08/2022 4:15:59 PM By: [name] EMT Entered By: [name] on 11/08/2022 16:15:59 [name] ([id number]) [id number] Page 2 of 2 -------------------------------------------------------------------------------- Vitals Details Patient Name: Date of Service: [name] 11/08/2022 1:45 PM Medical Record Number: [id number] Patient Account Number: [id number] Date of Birth/Sex: Treating RN: 11/13/1970 (51 y.o. [name] Primary Care [name] Other Clinician: Karl [name] Referring [name]: Treating [name]/Extender: [name] in Treatment: 3 Vital Signs Time Taken: 13:37 Temperature (F): 97.2 Height (in): 65 Pulse (bpm): 66 Respiratory Rate (breaths/min): 18 Blood Pressure (mmHg): 128/85 Reference Range: 80 - 120 mg / dl Electronic Signature(s) Signed: 11/08/2022 4:10:49 PM By: [name] EMT Entered By: [name] on 11/08/2022 16:10:49

## 2022-11-08 NOTE — Progress Notes (Addendum)
[name], [name] ([id number]) [id number] Page 1 of 2 Visit Report for 11/08/2022 HBO Details Patient Name: Date of Service: [name], [name] 11/08/2022 1:45 PM Medical Record Number: [id number] Patient Account Number: [id number] Date of Birth/Sex: Treating RN: [birth year] (52 y.o. Katrinka Blazing Primary Care Lauraann Missey: Janece Canterbury Other Clinician: Karl Bales Referring Shanera Meske: Treating Kaylan Yates/Extender: Luan Pulling in Treatment: 3 HBO Treatment Course Details Treatment Course Number: 1 Ordering Nelsie Domino: Geralyn Corwin T Treatments Ordered: otal 20 HBO Treatment Start Date: 10/23/2022 HBO Indication: Compromised/Failed Flap/Graft to Left Forehead HBO Treatment Details Treatment Number: 11 Patient Type: Outpatient Chamber Type: Monoplace Chamber Serial #: [id number] Treatment Protocol: 2.0 ATA with 90 minutes oxygen, with two 5 minute air breaks Treatment Details Compression Rate Down: 2.0 psi / minute De-Compression Rate Up: 2.0 psi / minute A breaks and breathing ir Compress Tx Pressure periods Decompress Decompress Begins Reached (leave unused spaces Begins Ends blank) Chamber Pressure (ATA 1 2 2 2 2 2  --2 1 ) Clock Time (24 hr) 13:42 13:51 14:21 14:26 14:56 15:01 - - 15:31 15:38 Treatment Length: 116 (minutes) Treatment Segments: 4 Vital Signs Capillary Blood Glucose Reference Range: 80 - 120 mg / dl HBO Diabetic Blood Glucose Intervention Range: <131 mg/dl or >308 mg/dl Time Vitals Blood Respiratory Capillary Blood Glucose Pulse Action Type: Pulse: Temperature: Taken: Pressure: Rate: Glucose (mg/dl): Meter #: Oximetry (%) Taken: Pre 13:37 128/85 66 18 97.2 Post 15:40 124/84 64 20 97 Treatment Response Treatment Toleration: Well Treatment Completion Status: Treatment Completed without Adverse Event Treatment Notes The patient comes from a Wound Care Visit today before her treatment. Physician HBO  Attestation: I certify that I supervised this HBO treatment in accordance with Medicare guidelines. A trained emergency response team is readily available per Yes hospital policies and procedures. Continue HBOT as ordered. Yes Electronic Signature(s) Signed: 11/09/2022 11:46:38 AM By: Geralyn Corwin DO Previous Signature: 11/08/2022 4:14:49 PM Version By: Karl Bales EMT Entered By: Geralyn Corwin on 11/09/2022 11:42:18 [name] ([id number]) [id number] Page 2 of 2 -------------------------------------------------------------------------------- HBO Safety Checklist Details Patient Name: Date of Service: [name], [name] 11/08/2022 1:45 PM Medical Record Number: [id number] Patient Account Number: [id number] Date of Birth/Sex: Treating RN: [birth year] (53 y.o. Katrinka Blazing Primary Care Geraldo Haris: Janece Canterbury Other Clinician: Karl Bales Referring [name]: Treating [name]/Extender: Luan Pulling in Treatment: 3 HBO Safety Checklist Items Safety Checklist Consent Form Signed Patient voided / foley secured and emptied When did you last eato yesterday Last dose of injectable or oral agent NA Ostomy pouch emptied and vented if applicable NA All implantable devices assessed, documented and approved NA Intravenous access site secured and place NA Valuables secured Linens and cotton and cotton/polyester blend (less than 51% polyester) Personal oil-based products / skin lotions / body lotions removed Wigs or hairpieces removed NA Smoking or tobacco materials removed Books / newspapers / magazines / loose paper removed Cologne, aftershave, perfume and deodorant removed Jewelry removed (may wrap wedding band) Make-up removed Hair care products removed Battery operated devices (external) removed Heating patches and chemical warmers removed Titanium eyewear removed NA Nail polish cured greater than 10  hours NA Casting material cured greater than 10 hours NA Hearing aids removed NA Loose dentures or partials removed NA Prosthetics have been removed NA Patient demonstrates correct use of air break device (if applicable) Patient concerns have been addressed Patient grounding bracelet on and cord attached to chamber Specifics for Inpatients (complete in addition to above) Medication  sheet sent with patient NA Intravenous medications needed or due during therapy sent with patient NA Drainage tubes (e.g. nasogastric tube or chest tube secured and vented) NA Endotracheal or Tracheotomy tube secured NA Cuff deflated of air and inflated with saline NA Airway suctioned NA Notes The safety checklist was done before the treatment started. Electronic Signature(s) Signed: 11/08/2022 4:12:24 PM By: Karl Bales EMT Entered By: Karl Bales on 11/08/2022 16:12:24

## 2022-11-08 NOTE — Progress Notes (Signed)
[name] ([id number]) [id number] Page 1 of 6 Visit Report for 11/08/2022 Chief Complaint Document Details Patient Name: Date of Service: [name] 11/08/2022 12:45 PM Medical Record Number: [id number] Patient Account Number: [id number] Date of Birth/Sex: Treating RN: [birth year] (52 y.o. F) Primary Care Provider: Janece Canterbury Other Clinician: Referring Provider: Treating Provider/Extender: Luan Pulling in Treatment: 3 Information Obtained from: Patient Chief Complaint 10/18/2022; patient was referred here by plastic surgery for consideration of hyperbaric oxygen for flap preservation after surgery on 10/09/2022. Electronic Signature(s) Signed: 11/08/2022 4:14:51 PM By: Geralyn Corwin DO Entered By: Geralyn Corwin on 11/08/2022 13:22:13 -------------------------------------------------------------------------------- HPI Details Patient Name: Date of Service: [name], [name] 11/08/2022 12:45 PM Medical Record Number: [id number] Patient Account Number: [id number] Date of Birth/Sex: Treating RN: June 16, 1970 (52 y.o. F) Primary Care Provider: Janece Canterbury Other Clinician: Referring Provider: Treating Provider/Extender: Luan Pulling in Treatment: 3 History of Present Illness HPI Description: 10/18/2022 ADMISSION This is a 52 year old woman who had progressive problems with ptosis bilaterally. On 5/28 she underwent a trichophytic brow ptosis repair and upper blepharoplasty. The patient tells me that she actually did really well until about day 4. She then developed an area of necrosis on the left part of the flap. She is also developed pain along the incision line and ballotable fluid on the right edge of the surgical line/flap. She is taking Vicodin for the pain putting topical mupirocin on the incision line. She was on warm compresses as well. She was on Augmentin but I believe she is finished  this. She does not complain of any visual disturbances. No fever. She has been referred here by plastic surgery for consideration of hyperbaric oxygen for flap necrosis/failed flap Past medical history includes multiple sclerosis, hypothyroidism, cervical and lumbar spondylosis and hypertension 6/13; patient presents for follow-up. She started hyperbaric oxygen therapy on 6/11 and has tolerated it well. She has no issues or complaints today. She has a small necrotic wound on the left front side of her head from a failed flap. She has been placing antibiotic ointment to this area. She has no issues or complaints today. Area is smaller today. 6/27; patient presents for follow-up. She continues to do hyperbaric oxygen therapy and is tolerating this well. She has no issues or complaints today. She has been using antibiotic ointment to the necrotic wound and this is smaller with healthier tissue present. Overall she is improving nicely. Electronic Signature(s) Signed: 11/08/2022 4:14:51 PM By: Geralyn Corwin DO Entered By: Geralyn Corwin on 11/08/2022 13:23:03 [name] ([id number]) [id number] Page 2 of 6 -------------------------------------------------------------------------------- Physical Exam Details Patient Name: Date of Service: [name], [name] 11/08/2022 12:45 PM Medical Record Number: [id number] Patient Account Number: [id number] Date of Birth/Sex: Treating RN: 1971/01/12 (52 y.o. F) Primary Care Provider: Janece Canterbury Other Clinician: Referring Provider: Treating Provider/Extender: Luan Pulling in Treatment: 3 Constitutional respirations regular, non-labored and within target range for patient.. Cardiovascular 2+ dorsalis pedis/posterior tibialis pulses. Psychiatric pleasant and cooperative. Notes Small area on the left part of the head with nonviable tissue and granulation tissue. All incision sites appear  well-healing. Electronic Signature(s) Signed: 11/08/2022 4:14:51 PM By: Geralyn Corwin DO Entered By: Geralyn Corwin on 11/08/2022 13:23:53 -------------------------------------------------------------------------------- Physician Orders Details Patient Name: Date of Service: [name] 11/08/2022 12:45 PM Medical Record Number: [id number] Patient Account Number: [id number] Date of Birth/Sex: Treating RN: 1970-10-05 (52 y.o. Ginette Pitman Primary Care Provider: Janece Canterbury  Other Clinician: Referring Provider: Treating Provider/Extender: Luan Pulling in Treatment: 3 Verbal / Phone Orders: No Diagnosis Coding Follow-up Appointments ppointment in 2 weeks. - Dr Ronnald Collum. Leanord Hawking Return A +++HBO ++++ Hyperbaric Oxygen Therapy Wound #1 Left Forehead Evaluate for HBO Therapy - compromise for a failed flap 2.0 ATA for 90 Minutes with 2 Five (5) Minute A Breaks ir Total Number of Treatments: - 20 One treatments per day (delivered Monday through Friday unless otherwise specified in Special Instructions below): Wound Treatment Wound #1 - Forehead Wound Laterality: Left Topical: Mupirocin Ointment 3 x Per Day/30 Days Discharge Instructions: Apply Mupirocin (Bactroban) as instructed Electronic Signature(s) Signed: 11/08/2022 4:14:51 PM By: Geralyn Corwin DO Entered By: Geralyn Corwin on 11/08/2022 13:23:59 [name] ([id number]) [id number] Page 3 of 6 -------------------------------------------------------------------------------- Problem List Details Patient Name: Date of Service: [name] 11/08/2022 12:45 PM Medical Record Number: [id number] Patient Account Number: [id number] Date of Birth/Sex: Treating RN: Mar 09, 1971 (52 y.o. 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 F) Primary Care Provider: Janece Canterbury Other Clinician: Referring Provider: Treating Provider/Extender: Luan Pulling in Treatment: 3 Subjective Chief Complaint Information obtained from Patient 10/18/2022; patient was referred here by plastic surgery for consideration of hyperbaric oxygen for flap preservation after surgery on 10/09/2022. History of Present Illness (HPI) 10/18/2022 ADMISSION This is a 52 year old woman who had progressive problems with ptosis bilaterally. On 5/28 she underwent a trichophytic brow ptosis repair and upper blepharoplasty. The patient tells me that she actually did really well until about day 4. She then developed an area of necrosis on the left part of the flap. She is also developed pain along the incision line and ballotable fluid on the right edge of the surgical line/flap. She is taking Vicodin for the pain putting topical mupirocin on the incision line. She was on warm compresses as well. She was on Augmentin but I believe she is finished this. She does not complain of any visual disturbances. No fever. She has been referred here by  plastic surgery for consideration of hyperbaric oxygen for flap necrosis/failed flap KAYSIE, MICHELINI ([id number]) [phone] Page 4 of 6 Past medical history includes multiple sclerosis, hypothyroidism, cervical and lumbar spondylosis and hypertension 6/13; patient presents for follow-up. She started hyperbaric oxygen therapy on 6/11 and has tolerated it well. She has no issues or complaints today. She has a small necrotic wound on the left front side of her head from a failed flap. She has been placing antibiotic ointment to this area. She has no issues or complaints today. Area is smaller today. 6/27; patient presents for follow-up. She continues to do hyperbaric oxygen therapy and is tolerating this well. She has no issues or complaints today. She has been using antibiotic ointment to the necrotic wound and this is smaller with healthier tissue present.  Overall she is improving nicely. Patient History Family History No family history of Cancer, Diabetes, Heart Disease, Hereditary Spherocytosis, Hypertension, Kidney Disease, Lung Disease, Seizures, Stroke, Thyroid Problems, Tuberculosis. Social History Former smoker - quit - ended on 08/28/2022, Marital Status - Divorced, Alcohol Use - Never, Drug Use - No History. Medical History Respiratory Patient has history of Asthma Musculoskeletal Patient has history of Osteoarthritis Hospitalization/Surgery History - left knee surgery x2 2019 and 2019. - hernia repair. - cholecystectomy. - ORIF left radial. - carpel tunnel release. - rotator cuff repair. - 10/09/2022 bilateral upper eyelids ptosis, trichophytic brow ptosis repair and upper blepharophasty. Medical A Surgical History Notes nd Constitutional Symptoms (General Health) hypothyroidism Musculoskeletal MS fibromyalgia neuromuscular disorder Psychiatric Depression Objective Constitutional respirations regular, non-labored and within target range for  patient.. Vitals Time Taken: 1:04 PM, Height: 65 in, Temperature: 98 F, Pulse: 77 bpm, Respiratory Rate: 18 breaths/min, Blood Pressure: 142/93 mmHg. Cardiovascular 2+ dorsalis pedis/posterior tibialis pulses. Psychiatric pleasant and cooperative. General Notes: Small area on the left part of the head with nonviable tissue and granulation tissue. All incision sites appear well-healing. Integumentary (Hair, Skin) Wound #1 status is Open. Original cause of wound was Surgical Injury. The date acquired was: 10/09/2022. The wound has been in treatment 3 weeks. The wound is located on the Left Forehead. The wound measures 0.2cm length x 0.3cm width x 0.1cm depth; 0.047cm^2 area and 0.005cm^3 volume. There is no tunneling or undermining noted. There is a none present amount of drainage noted. The wound margin is distinct with the outline attached to the wound base. There is no granulation within the wound bed. There is a large (67-100%) amount of necrotic tissue within the wound bed including Eschar. The periwound skin appearance did not exhibit: Callus, Crepitus, Excoriation, Induration, Rash, Scarring, Dry/Scaly, Maceration, Atrophie Blanche, Cyanosis, Ecchymosis, Hemosiderin Staining, Mottled, Pallor, Rubor, Erythema. 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 I recommended continuing antibiotic ointment and hyperbaric oxygen treatment. Follow- up in 2 weeks. [name], [name] ([id number]) [id number] Page 5 of 6 Plan Follow-up Appointments: Return Appointment in 2 weeks. - Dr Ronnald Collum. Leanord Hawking +++HBO ++++ Hyperbaric Oxygen Therapy: Wound #1 Left Forehead: Evaluate for HBO Therapy - compromise for a  failed flap 2.0 ATA for 90 Minutes with 2 Five (5) Minute Air Breaks T Number of Treatments: - 20 otal One treatments per day (delivered Monday through Friday unless otherwise specified in Special Instructions below): WOUND #1: - Forehead Wound Laterality: Left Topical: Mupirocin Ointment 3 x Per Day/30 Days Discharge Instructions: Apply Mupirocin (Bactroban) as instructed 1. Antibiotic ointment 2. Continue hyperbaric oxygen therapy 3. Follow-up in 2 weeks Electronic Signature(s) Signed: 11/08/2022 4:14:51 PM By: Geralyn Corwin DO Entered By: Geralyn Corwin on 11/08/2022 13:25:20 -------------------------------------------------------------------------------- HxROS Details Patient Name: Date of Service: [name], [name] 11/08/2022 12:45 PM Medical Record Number: [id number] Patient Account Number: [id number] Date of Birth/Sex: Treating RN: 1970/09/19 (52 y.o. F) Primary Care Provider: Janece Canterbury Other Clinician: Referring Provider: Treating Provider/Extender: Luan Pulling in Treatment: 3 Constitutional Symptoms (General Health) Medical History: Past Medical History Notes: hypothyroidism Respiratory Medical History: Positive for: Asthma Musculoskeletal Medical History: Positive for: Osteoarthritis Past Medical History Notes: MS fibromyalgia neuromuscular disorder Psychiatric Medical History: Past Medical History Notes: Depression Immunizations Pneumococcal Vaccine: Received Pneumococcal Vaccination: No Implantable Devices No devices  added [name], [name] ([id number]) [id number] Page 6 of 6 Hospitalization / Surgery History Type of Hospitalization/Surgery left knee surgery x2 2019 and 2019 hernia repair cholecystectomy ORIF left radial carpel tunnel release rotator cuff repair 10/09/2022 bilateral upper eyelids ptosis, trichophytic brow ptosis repair and upper blepharophasty Family and Social  History Cancer: No; Diabetes: No; Heart Disease: No; Hereditary Spherocytosis: No; Hypertension: No; Kidney Disease: No; Lung Disease: No; Seizures: No; Stroke: No; Thyroid Problems: No; Tuberculosis: No; Former smoker - quit - ended on 08/28/2022; Marital Status - Divorced; Alcohol Use: Never; Drug Use: No History; Financial Concerns: No; Food, Clothing or Shelter Needs: No; Support System Lacking: No; Transportation Concerns: No Electronic Signature(s) Signed: 11/08/2022 4:14:51 PM By: Geralyn Corwin DO Entered By: Geralyn Corwin on 11/08/2022 13:23:07 -------------------------------------------------------------------------------- SuperBill Details Patient Name: Date of Service: [name], [name] 11/08/2022 Medical Record Number: [id number] Patient Account Number: [id number] Date of Birth/Sex: Treating RN: 1971/04/07 (52 y.o. 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 Facility Procedures : CPT4 Code: [id number] Description: 99213 - WOUND CARE VISIT-LEV 3 EST PT Modifier: Quantity: 1 Physician Procedures : CPT4 Code Description Modifier [id number] - WC PHYS LEVEL 3 - EST PT ICD-10 Diagnosis Description 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

## 2022-11-09 ENCOUNTER — Encounter (HOSPITAL_BASED_OUTPATIENT_CLINIC_OR_DEPARTMENT_OTHER): Payer: Medicare Other | Admitting: Internal Medicine

## 2022-11-09 DIAGNOSIS — T86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: Secondary | ICD-10-CM | POA: Diagnosis not present

## 2022-11-09 NOTE — Progress Notes (Signed)
LADONA, ROSENDALE ([id number]) [id number] Page 1 of 3 Visit Report for 11/09/2022 Arrival Information Details Patient Name: Date of Service: TASI, BEAN 11/09/2022 10:00 A M Medical Record Number: [id number] Patient Account Number: [id number] Date of Birth/Sex: Treating RN: [birth year] (52 y.o. [name] Primary Care Miah Boye: Janece Canterbury Other Clinician: Haywood Pao Referring Harsh Trulock: Treating Clarice Bonaventure/Extender: Luan Pulling in Treatment: 3 Visit Information History Since Last Visit All ordered tests and consults were completed: Yes Patient Arrived: Ambulatory Added or deleted any medications: No Arrival Time: 09:14 Any new allergies or adverse reactions: No Accompanied By: friend Had a fall or experienced change in No Transfer Assistance: None activities of daily living that may affect Patient Identification Verified: Yes risk of falls: Secondary Verification Process Completed: Yes Signs or symptoms of abuse/neglect since last visito No Patient Requires Transmission-Based Precautions: No Hospitalized since last visit: No Patient Has Alerts: No Implantable device outside of the clinic excluding No cellular tissue based products placed in the center since last visit: Pain Present Now: No Electronic Signature(s) Signed: 11/09/2022 2:02:22 PM By: Haywood Pao CHT EMT BS , , Entered By: Haywood Pao on 11/09/2022 14:02:21 -------------------------------------------------------------------------------- Clinic Level of Care Assessment Details Patient Name: Date of Service: IMANA, MINARIK 11/09/2022 10:00 A M Medical Record Number: [id number] Patient Account Number: [id number] Date of Birth/Sex: Treating RN: 08/08/1970 (52 y.o. [name] Primary Care Seleena Reimers: Janece Canterbury Other Clinician: Haywood Pao Referring Melvern Ramone: Treating Jackquelyn Sundberg/Extender: Luan Pulling in Treatment: 3 Clinic Level of Care Assessment Items TOOL 3 Quantity Score []  - 0 Use when EandM and Procedure is performed on FOLLOW-UP visit ASSESSMENTS - Nursing Assessment / Reassessment []  - 0 Reassessment of Co-morbidities (includes updates in patient status) []  - 0 Reassessment of Adherence to Treatment Plan ASSESSMENTS - Wound and Skin Assessment / Reassessment []  - Points for Wound Assessment can only be taken for a new wound of unknown or different etiology and a procedure is 0 NOT performed to that wound []  - 0 Simple Wound Assessment / Reassessment - one wound []  - 0 Complex Wound Assessment / Reassessment - multiple wounds []  - 0 Dermatologic / Skin Assessment (not related to wound area) ASSESSMENTS - Focused Assessment []  - 0 Circumferential Edema Measurements - multi extremities MAUDINE, MCASKILL ([id number]) [id number] Page 2 of 3 []  - 0 Nutritional Assessment / Counseling / Intervention []  - 0 Lower Extremity Assessment (monofilament, tuning fork, pulses) []  - 0 Peripheral Arterial Disease Assessment (using hand held doppler) ASSESSMENTS - Ostomy and/or Continence Assessment and Care []  - 0 Incontinence Assessment and Management []  - 0 Ostomy Care Assessment and Management (repouching, etc.) PROCESS - Coordination of Care []  - Points for Discharge Coordination can only be taken for a new wound of unknown or different etiology and a 0 procedure is NOT performed to that wound []  - 0 Simple Patient / Family Education for ongoing care []  - 0 Complex (extensive) Patient / Family Education for ongoing care []  - 0 Staff obtains Chiropractor, Records, T Results / Process Orders est []  - 0 Staff telephones HHA, Nursing Homes / Clarify orders / etc []  - 0 Routine Transfer to another Facility (non-emergent condition) []  - 0 Routine Hospital Admission (non-emergent condition) []  - 0 New Admissions /  Manufacturing engineer / Ordering NPWT Apligraf, etc. , []  - 0 Emergency Hospital Admission (emergent condition) []  - 0 Simple Discharge Coordination []  - 0 Complex (extensive) Discharge Coordination PROCESS - Special  Needs []  - 0 Pediatric / Minor Patient Management []  - 0 Isolation Patient Management []  - 0 Hearing / Language / Visual special needs []  - 0 Assessment of Community assistance (transportation, D/C planning, etc.) []  - 0 Additional assistance / Altered mentation []  - 0 Support Surface(s) Assessment (bed, cushion, seat, etc.) INTERVENTIONS - Wound Cleansing / Measurement []  - Points for Wound Cleaning / Measurement, Wound Dressing, Specimen Collection and Specimen taken to lab can 0 only be taken for a new wound of unknown or different etiology and a procedure is NOT performed to that wound []  - 0 Simple Wound Cleansing - one wound []  - 0 Complex Wound Cleansing - multiple wounds []  - 0 Wound Imaging (photographs - any number of wounds) []  - 0 Wound Tracing (instead of photographs) []  - 0 Simple Wound Measurement - one wound []  - 0 Complex Wound Measurement - multiple wounds INTERVENTIONS - Wound Dressings []  - 0 Small Wound Dressing one or multiple wounds []  - 0 Medium Wound Dressing one or multiple wounds []  - 0 Large Wound Dressing one or multiple wounds INTERVENTIONS - Miscellaneous X- 1 5 External ear exam []  - 0 Specimen Collection (cultures, biopsies, blood, body fluids, etc.) []  - 0 Specimen(s) / Culture(s) sent or taken to Lab for analysis []  - 0 Patient Transfer (multiple staff / Michiel Sites Lift / Similar devices) []  - 0 Simple Staple / Suture removal (25 or less) [name], [name] ([id number]) [id number] Page 3 of 3 []  - 0 Complex Staple / Suture removal (26 or more) []  - 0 Hypo / Hyperglycemic Management (close monitor of Blood Glucose) []  - 0 Ankle / Brachial Index (ABI) - do not check if billed  separately X- 1 5 Vital Signs Has the patient been seen at the hospital within the last three years: Yes Total Score: 10 Level Of Care: New/Established - Level 1 Electronic Signature(s) Unsigned Entered By: Haywood Pao on 11/09/2022 14:07:17 -------------------------------------------------------------------------------- General Visit Notes Details Patient Name: Date of Service: EV TYSHONDA, SIVERS 11/09/2022 10:00 A M Medical Record Number: [id number] Patient Account Number: [id number] Date of Birth/Sex: Treating RN: 03/11/1971 (52 y.o. [name] Primary Care Aiyannah Fayad: Janece Canterbury Other Clinician: Haywood Pao Referring Merilynn Haydu: Treating Demitrious Mccannon/Extender: Luan Pulling in Treatment: 3 Notes Mrs. Macauley arrived with normal vital signs. She asked for Dr. Mikey Bussing to examine her ears. Dr. Mikey Bussing referred patient to ENT due to some complications with equalization tubes and patient is rescheduled for hyperbaric oxygen treatments. Electronic Signature(s) Signed: 11/09/2022 2:05:02 PM By: Haywood Pao CHT EMT BS , , Entered By: Haywood Pao on 11/09/2022 14:05:02 -------------------------------------------------------------------------------- Vitals Details Patient Name: Date of Service: [name], Earnie 11/09/2022 10:00 A M Medical Record Number: [id number] Patient Account Number: [id number] Date of Birth/Sex: Treating RN: [birth year] (52 y.o. [name] Primary Care Binyomin Brann: Janece Canterbury Other Clinician: Haywood Pao Referring Shadia Larose: Treating Kino Dunsworth/Extender: Luan Pulling in Treatment: 3 Vital Signs Time Taken: 09:46 Temperature (F): 98.4 Height (in): 65 Pulse (bpm): 79 Respiratory Rate (breaths/min): 18 Blood Pressure (mmHg): 104/60 Reference Range: 80 - 120 mg / dl Electronic Signature(s) Signed: 11/09/2022 2:02:41 PM By: Haywood Pao CHT EMT BS , , Entered  By: Haywood Pao on 11/09/2022 14:02:41

## 2022-11-09 NOTE — Progress Notes (Signed)
TULSA, BRYE ([id number]) [id number] Page 1 of 2 Visit Report for 11/08/2022 Problem List Details Patient Name: Date of Service: [name], [name] 11/08/2022 1:45 PM Medical Record Number: [id number] Patient Account Number: [id number] Date of Birth/Sex: Treating RN: [birth year] (52 y.o. 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 Facility Procedures ALLYSSA, WAYMENT ([id number]): CPT4 Code Description  [id number]-(Facility Use Only) HBOT full body chamber, , ICD-10 Diagnosis Description 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 Disruption of external operation (surgical)  wound, not elsewhere clas 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 [id number] Page 2 of 2: Modifier Quantity 4 sified, subsequent encounter Physician Procedures : CPT4 Code Description Modifier [phone] - WC PHYS HYPERBARIC OXYGEN THERAPY ICD-10 Diagnosis Description 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

## 2022-11-09 NOTE — Progress Notes (Signed)
[name] ([id number]) [id number] Page 1 of 8 Visit Report for 11/08/2022 Arrival Information Details Patient Name: Date of Service: [name], [name] 11/08/2022 12:45 PM Medical Record Number: [id number] Patient Account Number: [id number] Date of Birth/Sex: Treating RN: 03-31-1971 (52 y.o. Ginette Pitman Primary Care Ellarose Brandi: Janece Canterbury Other Clinician: Referring Tayo Maute: Treating Dolores Mcgovern/Extender: Luan Pulling in Treatment: 3 Visit Information History Since Last Visit Added or deleted any medications: No Patient Arrived: Ambulatory Any new allergies or adverse reactions: No Arrival Time: 13:03 Had a fall or experienced change in No Accompanied By: family activities of daily living that may affect Transfer Assistance: None risk of falls: Patient Identification Verified: Yes Signs or symptoms of abuse/neglect since last visito No Secondary Verification Process Completed: Yes Hospitalized since last visit: No Patient Requires Transmission-Based Precautions: No Implantable device outside of the clinic excluding No Patient Has Alerts: No cellular tissue based products placed in the center since last visit: Pain Present Now: No Electronic Signature(s) Signed: 11/09/2022 1:07:25 PM By: Midge Aver MSN RN CNS WTA Entered By: Midge Aver on 11/08/2022 13:16:51 -------------------------------------------------------------------------------- Clinic Level of Care Assessment Details Patient Name: Date of Service: [name] 11/08/2022 12:45 PM Medical Record Number: [id number] Patient Account Number: [id number] Date of Birth/Sex: Treating RN: 17-Jun-1970 (52 y.o. Ginette Pitman Primary Care Deng Kemler: Janece Canterbury Other Clinician: Referring Jesicca Dipierro: Treating Nain Rudd/Extender: Luan Pulling in Treatment: 3 Clinic Level of Care Assessment Items TOOL 4 Quantity Score X- 1 0 Use when  only an EandM is performed on FOLLOW-UP visit ASSESSMENTS - Nursing Assessment / Reassessment X- 1 10 Reassessment of Co-morbidities (includes updates in patient status) X- 1 5 Reassessment of Adherence to Treatment Plan ASSESSMENTS - Wound and Skin A ssessment / Reassessment X - Simple Wound Assessment / Reassessment - one wound 1 5 []  - 0 Complex Wound Assessment / Reassessment - multiple wounds []  - 0 Dermatologic / Skin Assessment (not related to wound area) ASSESSMENTS - Focused Assessment []  - 0 Circumferential Edema Measurements - multi extremities []  - 0 Nutritional Assessment / Counseling / Intervention []  - 0 Lower Extremity Assessment (monofilament, tuning fork, pulses) [name], [name] ([id number]) [id number] Page 2 of 8 []  - 0 Peripheral Arterial Disease Assessment (using hand held doppler) ASSESSMENTS - Ostomy and/or Continence Assessment and Care []  - 0 Incontinence Assessment and Management []  - 0 Ostomy Care Assessment and Management (repouching, etc.) PROCESS - Coordination of Care X - Simple Patient / Family Education for ongoing care 1 15 []  - 0 Complex (extensive) Patient / Family Education for ongoing care X- 1 10 Staff obtains Chiropractor, Records, T Results / Process Orders est []  - 0 Staff telephones HHA, Nursing Homes / Clarify orders / etc []  - 0 Routine Transfer to another Facility (non-emergent condition) []  - 0 Routine Hospital Admission (non-emergent condition) []  - 0 New Admissions / Manufacturing engineer / Ordering NPWT Apligraf, etc. , []  - 0 Emergency Hospital Admission (emergent condition) X- 1 10 Simple Discharge Coordination []  - 0 Complex (extensive) Discharge Coordination PROCESS - Special Needs []  - 0 Pediatric / Minor Patient Management []  - 0 Isolation Patient Management []  - 0 Hearing / Language / Visual special needs []  - 0 Assessment of Community assistance (transportation, D/C planning,  etc.) []  - 0 Additional assistance / Altered mentation []  - 0 Support Surface(s) Assessment (bed, cushion, seat, etc.) INTERVENTIONS - Wound Cleansing / Measurement X - Simple Wound Cleansing - one wound 1 5 []  -  0 Complex Wound Cleansing - multiple wounds X- 1 5 Wound Imaging (photographs - any number of wounds) []  - 0 Wound Tracing (instead of photographs) X- 1 5 Simple Wound Measurement - one wound []  - 0 Complex Wound Measurement - multiple wounds INTERVENTIONS - Wound Dressings X - Small Wound Dressing one or multiple wounds 1 10 []  - 0 Medium Wound Dressing one or multiple wounds []  - 0 Large Wound Dressing one or multiple wounds []  - 0 Application of Medications - topical []  - 0 Application of Medications - injection INTERVENTIONS - Miscellaneous []  - 0 External ear exam []  - 0 Specimen Collection (cultures, biopsies, blood, body fluids, etc.) []  - 0 Specimen(s) / Culture(s) sent or taken to Lab for analysis []  - 0 Patient Transfer (multiple staff / Nurse, adult / Similar devices) []  - 0 Simple Staple / Suture removal (25 or less) []  - 0 Complex Staple / Suture removal (26 or more) []  - 0 Hypo / Hyperglycemic Management (close monitor of Blood Glucose) []  - 0 Ankle / Brachial Index (ABI) - do not check if billed separately [name] ([id number]) [phone].pdf Page 3 of 8 X- 1 5 Vital Signs Has the patient been seen at the hospital within the last three years: Yes Total Score: 85 Level Of Care: New/Established - Level 3 Electronic Signature(s) Signed: 11/09/2022 1:07:25 PM By: Midge Aver MSN RN CNS WTA Entered By: Midge Aver on 11/08/2022 13:20:09 -------------------------------------------------------------------------------- Encounter Discharge Information Details Patient Name: Date of Service: [name], [name] 11/08/2022 12:45 PM Medical Record Number: [id number] Patient Account Number: [id number] Date of  Birth/Sex: Treating RN: May 05, 1971 (52 y.o. Ginette Pitman Primary Care Nicolasa Milbrath: Janece Canterbury Other Clinician: Referring Lyndsy Gilberto: Treating Geneal Huebert/Extender: Luan Pulling in Treatment: 3 Encounter Discharge Information Items Discharge Condition: Stable Ambulatory Status: Ambulatory Discharge Destination: Home Transportation: Private Auto Accompanied By: self Schedule Follow-up Appointment: Yes Clinical Summary of Care: Electronic Signature(s) Signed: 11/09/2022 1:07:25 PM By: Midge Aver MSN RN CNS WTA Entered By: Midge Aver on 11/08/2022 13:23:27 -------------------------------------------------------------------------------- Lower Extremity Assessment Details Patient Name: Date of Service: [name], [name] 11/08/2022 12:45 PM Medical Record Number: [id number] Patient Account Number: [id number] Date of Birth/Sex: Treating RN: 01-02-1971 (52 y.o. F) Primary Care Riki Gehring: Janece Canterbury Other Clinician: Referring Kaye Luoma: Treating Ikhlas Albo/Extender: Luan Pulling in Treatment: 3 Electronic Signature(s) Signed: 11/09/2022 1:07:25 PM By: Midge Aver MSN RN CNS WTA Entered By: Midge Aver on 11/08/2022 13:17:29 -------------------------------------------------------------------------------- Multi Wound Chart Details Patient Name: Date of Service: [name], [name] 11/08/2022 12:45 PM Medical Record Number: [id number] Patient Account Number: [id number] Date of Birth/Sex: Treating RN: 13-Oct-1970 (52 y.o. Oran Rein, [name] ([id number]) [id number] Page 4 of 8 Primary Care Roderica Cathell: Janece Canterbury Other Clinician: Referring Elzie Knisley: Treating Ahmaad Neidhardt/Extender: Luan Pulling in Treatment: 3 Vital Signs Height(in): 65 Pulse(bpm): 77 Weight(lbs): Blood Pressure(mmHg): 142/93 Body Mass Index(BMI): Temperature(F): 98 Respiratory Rate(breaths/min): 18 [1:Photos:]  [N/A:N/A] Left Forehead N/A N/A Wound Location: Surgical Injury N/A N/A Wounding Event: Compromised Skin Graft/Flap N/A N/A Primary Etiology: Asthma, Osteoarthritis N/A N/A Comorbid History: 10/09/2022 N/A N/A Date Acquired: 3 N/A N/A Weeks of Treatment: Open N/A N/A Wound Status: No N/A N/A Wound Recurrence: 0.2x0.3x0.1 N/A N/A Measurements L x W x D (cm) 0.047 N/A N/A A (cm) : rea 0.005 N/A N/A Volume (cm) : 97.00% N/A N/A % Reduction in A rea: 96.80% N/A N/A % Reduction in Volume: Unclassifiable N/A N/A Classification: None Present N/A N/A  Exudate A mount: Distinct, outline attached N/A N/A Wound Margin: None Present (0%) N/A N/A Granulation A mount: Large (67-100%) N/A N/A Necrotic A mount: Eschar N/A N/A Necrotic Tissue: Fascia: No N/A N/A Exposed Structures: Fat Layer (Subcutaneous Tissue): No Tendon: No Muscle: No Joint: No Bone: No None N/A N/A Epithelialization: Excoriation: No N/A N/A Periwound Skin Texture: Induration: No Callus: No Crepitus: No Rash: No Scarring: No Maceration: No N/A N/A Periwound Skin Moisture: Dry/Scaly: No Atrophie Blanche: No N/A N/A Periwound Skin Color: Cyanosis: No Ecchymosis: No Erythema: No Hemosiderin Staining: No Mottled: No Pallor: No Rubor: No Treatment Notes Electronic Signature(s) Signed: 11/08/2022 4:14:51 PM By: Geralyn Corwin DO Entered By: Geralyn Corwin on 11/08/2022 13:20:52 [name] ([id number]) [id number] Page 5 of 8 -------------------------------------------------------------------------------- Multi-Disciplinary Care Plan Details Patient Name: Date of Service: [name], [name] 11/08/2022 12:45 PM Medical Record Number: [id number] Patient Account Number: [id number] Date of Birth/Sex: Treating RN: 1970/06/29 (52 y.o. Ginette Pitman Primary Care Fredonia Casalino: Janece Canterbury Other Clinician: Referring Amanii Snethen: Treating Nadeen Shipman/Extender: Luan Pulling in Treatment: 3 Active Inactive HBO Nursing Diagnoses: Potential for oxygen toxicity seizures related to delivery of 100% oxygen at an increased atmospheric pressure Goals: Patient and/or family will be able to state/discuss factors appropriate to the management of their disease process during treatment Date Initiated: 10/18/2022 Target Resolution Date: 01/12/2023 Goal Status: Active Interventions: Assess for signs and symptoms related to adverse events, including but not limited to confinement anxiety, pneumothorax, oxygen toxicity and baurotrauma Notes: Electronic Signature(s) Signed: 11/09/2022 1:07:25 PM By: Midge Aver MSN RN CNS WTA Entered By: Midge Aver on 11/08/2022 13:18:41 -------------------------------------------------------------------------------- Pain Assessment Details Patient Name: Date of Service: [name], [name] 11/08/2022 12:45 PM Medical Record Number: [id number] Patient Account Number: [id number] Date of Birth/Sex: Treating RN: 09/01/1970 (52 y.o. Ginette Pitman Primary Care Sabah Zucco: Janece Canterbury Other Clinician: Referring Cassity [name]: Treating Alicyn Klann/Extender: Luan Pulling in Treatment: 3 Active Problems Location of Pain Severity and Description of Pain Patient Has Paino No Site Locations Pain Management and Medication Current Pain Management: ANDREY, ALWOOD ([id number]) [phone].pdf Page 6 of 8 Electronic Signature(s) Signed: 11/09/2022 1:07:25 PM By: Midge Aver MSN RN CNS WTA Entered By: Midge Aver on 11/08/2022 13:17:22 -------------------------------------------------------------------------------- Patient/Caregiver Education Details Patient Name: Date of Service: [name] 6/27/2024andnbsp12:45 PM Medical Record Number: [id number] Patient Account Number: [id number] Date of Birth/Gender: Treating RN: December 22, 1970 (52 y.o. Ginette Pitman Primary Care  Physician: Janece Canterbury Other Clinician: Referring Physician: Treating Physician/Extender: Luan Pulling in Treatment: 3 Education Assessment Education Provided To: Patient Education Topics Provided Wound/Skin Impairment: Handouts: Caring for Your Ulcer Methods: Explain/Verbal Responses: State content correctly Electronic Signature(s) Signed: 11/09/2022 1:07:25 PM By: Midge Aver MSN RN CNS WTA Entered By: Midge Aver on 11/08/2022 13:19:20 -------------------------------------------------------------------------------- Wound Assessment Details Patient Name: Date of Service: [name], [name] 11/08/2022 12:45 PM Medical Record Number: [id number] Patient Account Number: [id number] Date of Birth/Sex: Treating RN: 08-12-1970 (52 y.o. F) Primary Care Eiliana Drone: Janece Canterbury Other Clinician: Referring Stony Stegmann: Treating Jackeline Gutknecht/Extender: Luan Pulling in Treatment: 3 Wound Status Wound Number: 1 Primary Etiology: Compromised Skin Graft/Flap Wound Location: Left Forehead Wound Status: Open Wounding Event: Surgical Injury Comorbid History: Asthma, Osteoarthritis Date Acquired: 10/09/2022 Weeks Of Treatment: 3 Clustered Wound: No Photos [name] ([id number]) [id number] Page 7 of 8 Wound Measurements Length: (cm) 0.2 Width: (cm) 0.3 Depth: (cm) 0.1 Area: (cm) 0.047 Volume: (cm) 0.005 % Reduction in  Area: 97% % Reduction in Volume: 96.8% Epithelialization: None Tunneling: No Undermining: No Wound Description Classification: Unclassifiable Wound Margin: Distinct, outline attached Exudate Amount: None Present Foul Odor After Cleansing: No Slough/Fibrino No Wound Bed Granulation Amount: None Present (0%) Exposed Structure Necrotic Amount: Large (67-100%) Fascia Exposed: No Necrotic Quality: Eschar Fat Layer (Subcutaneous Tissue) Exposed: No Tendon Exposed: No Muscle Exposed: No Joint  Exposed: No Bone Exposed: No Periwound Skin Texture Texture Color No Abnormalities Noted: No No Abnormalities Noted: No Callus: No Atrophie Blanche: No Crepitus: No Cyanosis: No Excoriation: No Ecchymosis: No Induration: No Erythema: No Rash: No Hemosiderin Staining: No Scarring: No Mottled: No Pallor: No Moisture Rubor: No No Abnormalities Noted: No Dry / Scaly: No Maceration: No Treatment Notes Wound #1 (Forehead) Wound Laterality: Left Cleanser Peri-Wound Care Topical Mupirocin Ointment Discharge Instruction: Apply Mupirocin (Bactroban) as instructed Primary Dressing Secondary Dressing Secured With Compression Wrap Compression Stockings Add-Ons Electronic Signature(s) Signed: 11/09/2022 11:18:15 AM By: Thayer Dallas Entered By: Thayer Dallas on 11/08/2022 13:10:11 [name] ([id number]) [id number] Page 8 of 8 -------------------------------------------------------------------------------- Vitals Details Patient Name: Date of Service: [name], [name] 11/08/2022 12:45 PM Medical Record Number: [id number] Patient Account Number: [id number] Date of Birth/Sex: Treating RN: 05-31-1970 (52 y.o. F) Primary Care Imogine Carvell: Janece Canterbury Other Clinician: Referring Murel Shenberger: Treating Cassius Cullinane/Extender: Luan Pulling in Treatment: 3 Vital Signs Time Taken: 13:04 Temperature (F): 98 Height (in): 65 Pulse (bpm): 77 Respiratory Rate (breaths/min): 18 Blood Pressure (mmHg): 142/93 Reference Range: 80 - 120 mg / dl Electronic Signature(s) Signed: 11/09/2022 1:07:25 PM By: Midge Aver MSN RN CNS WTA Entered By: Midge Aver on 11/08/2022 13:16:55

## 2022-11-12 NOTE — Progress Notes (Signed)
KINZLEIGH, ALTMAN ([id number]) [id number] Page 1 of 1 Visit Report for 11/09/2022 SuperBill Details Patient Name: Date of Service: [name], [name] 11/09/2022 Medical Record Number: [id number] Patient Account Number: [id number] Date of Birth/Sex: Treating RN: 24-Dec-1970 (52 y.o. 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

## 2022-11-13 ENCOUNTER — Encounter (HOSPITAL_BASED_OUTPATIENT_CLINIC_OR_DEPARTMENT_OTHER): Payer: Medicare Other | Attending: General Surgery | Admitting: General Surgery

## 2022-11-13 DIAGNOSIS — H0270 Unspecified degenerative disorders of eyelid and periocular area: Secondary | ICD-10-CM | POA: Insufficient documentation

## 2022-11-13 DIAGNOSIS — T8131XA Disruption of external operation (surgical) wound, not elsewhere classified, initial encounter: Secondary | ICD-10-CM | POA: Diagnosis not present

## 2022-11-13 DIAGNOSIS — T86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: Secondary | ICD-10-CM | POA: Diagnosis present

## 2022-11-13 DIAGNOSIS — Y838 Other surgical procedures as the cause of abnormal reaction of the patient, or of later complication, without mention of misadventure at the time of the procedure: Secondary | ICD-10-CM | POA: Insufficient documentation

## 2022-11-13 DIAGNOSIS — G35 Multiple sclerosis: Secondary | ICD-10-CM | POA: Insufficient documentation

## 2022-11-13 NOTE — Progress Notes (Signed)
[name], [name] ([id number]) [id number] Page 1 of 2 Visit Report for 11/13/2022 HBO Details Patient Name: Date of Service: [name], [name] 11/13/2022 1:00 PM Medical Record Number: [id number] Patient Account Number: [id number] Date of Birth/Sex: Treating RN: 02-11-1971 (52 y.o. [name], [name] Primary Care Rodney Wigger: Janece Canterbury Other Clinician: Karl Bales Referring [place] Crance: Treating Mettie Roylance/Extender: Neldon Newport in Treatment: 3 HBO Treatment Course Details Treatment Course Number: 1 Ordering Payden Bonus: Geralyn Corwin T Treatments Ordered: otal 20 HBO Treatment Start Date: 10/23/2022 HBO Indication: Compromised/Failed Flap/Graft to Left Forehead HBO Treatment Details Treatment Number: 12 Patient Type: Outpatient Chamber Type: Monoplace Chamber Serial #: [id number] Treatment Protocol: 2.0 ATA with 90 minutes oxygen, with two 5 minute air breaks Treatment Details Compression Rate Down: 1.0 psi / minute De-Compression Rate Up: 1.0 psi / minute A breaks and ir breathing Compress Tx Pressure Decompress Decompress periods Begins Reached Begins Ends (leave unused spaces blank) Chamber Pressure (ATA 1 2 2 2  22--2 1 ) Clock Time (24 hr) 13:41 13:54 14:24 14:29 - - - - 14:32 14:46 Treatment Length: 65 (minutes) Treatment Segments: 2 Vital Signs Capillary Blood Glucose Reference Range: 80 - 120 mg / dl HBO Diabetic Blood Glucose Intervention Range: <131 mg/dl or >962 mg/dl Time Vitals Blood Respiratory Capillary Blood Glucose Pulse Action Type: Pulse: Temperature: Taken: Pressure: Rate: Glucose (mg/dl): Meter #: Oximetry (%) Taken: Pre 13:13 128/86 84 18 98.1 Post 14:50 128/96 65 18 98.7 Treatment Response Treatment Toleration: Fair Adverse Events: 1:Barotrauma - Sinus, 2:Barotrauma - Ear Treatment Completion Status: Treatment Aborted/Not Restarted Reason: Adverse Event Treatment Notes At 1430 the patient  complained of pain over right eye, right sinus, right ear with nausea. I spoke with Dr. Lady Gary. Treatment aborted.at 1432 Dr. Lady Gary saw the patient at chamber side. Left ear TEED 2, Right ear blood. The patient was seen by ENT on 11/12/2022 and cleared for HBO treatment. Dr. Lady Gary also stated to hold treatment for the next 2 days. The patient used Afrin post treatment per Dr. Lady Gary orders. Electronic Signature(s) Signed: 11/13/2022 4:51:08 PM By: Karl Bales EMT Signed: 11/14/2022 7:55:03 AM By: Duanne Guess MD FACS Entered By: Karl Bales on 11/13/2022 16:51:08 [name] ([id number]) [id number] Page 2 of 2 -------------------------------------------------------------------------------- HBO Safety Checklist Details Patient Name: Date of Service: [name], [name] 11/13/2022 1:00 PM Medical Record Number: [id number] Patient Account Number: [id number] Date of Birth/Sex: Treating RN: 04-21-1971 (52 y.o. [name], [name] Primary Care Duval Macleod: Janece Canterbury Other Clinician: Karl Bales Referring [name]: Treating Bernal Luhman/Extender: Neldon Newport in Treatment: 3 HBO Safety Checklist Items Safety Checklist Consent Form Signed Patient voided / foley secured and emptied When did you last eato 1130 Last dose of injectable or oral agent NA Ostomy pouch emptied and vented if applicable NA All implantable devices assessed, documented and approved NA Intravenous access site secured and place NA Valuables secured Linens and cotton and cotton/polyester blend (less than 51% polyester) Personal oil-based products / skin lotions / body lotions removed Wigs or hairpieces removed Smoking or tobacco materials removed Books / newspapers / magazines / loose paper removed Cologne, aftershave, perfume and deodorant removed Jewelry removed (may wrap wedding band) Make-up removed Hair care products removed Battery operated devices  (external) removed Heating patches and chemical warmers removed Titanium eyewear removed NA Nail polish cured greater than 10 hours NA Casting material cured greater than 10 hours NA Hearing aids removed NA Loose dentures or partials removed NA Prosthetics have been removed NA Patient  demonstrates correct use of air break device (if applicable) Patient concerns have been addressed Patient grounding bracelet on and cord attached to chamber Specifics for Inpatients (complete in addition to above) Medication sheet sent with patient NA Intravenous medications needed or due during therapy sent with patient NA Drainage tubes (e.g. nasogastric tube or chest tube secured and vented) NA Endotracheal or Tracheotomy tube secured NA Cuff deflated of air and inflated with saline NA Airway suctioned NA Notes The safety checklist was done before the treatment was started. Electronic Signature(s) Signed: 11/13/2022 4:41:50 PM By: Karl Bales EMT Entered By: Karl Bales on 11/13/2022 16:41:49

## 2022-11-13 NOTE — Progress Notes (Signed)
SYNTHIA, VANALSTYNE ([id number]) [id number] Page 1 of 2 Visit Report for 11/13/2022 Arrival Information Details Patient Name: Date of Service: [name], [name] 11/13/2022 1:00 PM Medical Record Number: [id number] Patient Account Number: [id number] Date of Birth/Sex: Treating RN: Jul 05, 1970 (52 y.o. [name], [name] Primary Care Melisha Eggleton: Janece Canterbury Other Clinician: Karl Bales Referring Wynter Isaacs: Treating Zyaire Mccleod/Extender: Neldon Newport in Treatment: 3 Visit Information History Since Last Visit All ordered tests and consults were completed: Yes Patient Arrived: Ambulatory Added or deleted any medications: No Arrival Time: 12:53 Any new allergies or adverse reactions: No Accompanied By: Friend Had a fall or experienced change in No Transfer Assistance: None activities of daily living that may affect Patient Identification Verified: Yes risk of falls: Secondary Verification Process Completed: Yes Signs or symptoms of abuse/neglect since last visito No Patient Requires Transmission-Based Precautions: No Hospitalized since last visit: No Patient Has Alerts: No Implantable device outside of the clinic excluding No cellular tissue based products placed in the center since last visit: Pain Present Now: No Electronic Signature(s) Signed: 11/13/2022 4:39:14 PM By: Karl Bales EMT Entered By: Karl Bales on 11/13/2022 16:39:14 -------------------------------------------------------------------------------- Encounter Discharge Information Details Patient Name: Date of Service: [name], [name] 11/13/2022 1:00 PM Medical Record Number: [id number] Patient Account Number: [id number] Date of Birth/Sex: Treating RN: Apr 24, 1971 (52 y.o. [name], [name] Primary Care Lilya Smitherman: Janece Canterbury Other Clinician: Karl Bales Referring Tameka Hoiland: Treating Lyan Moyano/Extender: Neldon Newport in Treatment:  3 Encounter Discharge Information Items Discharge Condition: Stable Ambulatory Status: Ambulatory Discharge Destination: Home Transportation: Private Auto Accompanied By: Clearence Cheek Schedule Follow-up Appointment: Yes Clinical Summary of Care: Electronic Signature(s) Signed: 11/13/2022 4:52:42 PM By: Karl Bales EMT Entered By: Karl Bales on 11/13/2022 16:52:42 [name] ([id number]) [id number] Page 2 of 2 -------------------------------------------------------------------------------- Vitals Details Patient Name: Date of Service: [name], [name] 11/13/2022 1:00 PM Medical Record Number: [id number] Patient Account Number: [id number] Date of Birth/Sex: Treating RN: Nov 11, 1970 (52 y.o. [name], [name] Primary Care Jalon Squier: Janece Canterbury Other Clinician: Karl Bales Referring Garv Kuechle: Treating Ryott Rafferty/Extender: Neldon Newport in Treatment: 3 Vital Signs Time Taken: 13:13 Temperature (F): 98.1 Height (in): 65 Pulse (bpm): 84 Respiratory Rate (breaths/min): 18 Blood Pressure (mmHg): 128/86 Reference Range: 80 - 120 mg / dl Electronic Signature(s) Signed: 11/13/2022 4:40:16 PM By: Karl Bales EMT Entered By: Karl Bales on 11/13/2022 16:40:15

## 2022-11-14 ENCOUNTER — Encounter (HOSPITAL_BASED_OUTPATIENT_CLINIC_OR_DEPARTMENT_OTHER): Payer: Medicare Other | Admitting: Physician Assistant

## 2022-11-14 NOTE — Progress Notes (Addendum)
CONNELLY, ARAMBURU ([id number]) [id number] Page 1 of 2 Visit Report for 11/13/2022 Problem List Details Patient Name: Date of Service: [name], [name] 11/13/2022 1:00 PM Medical Record Number: [id number] Patient Account Number: [id number] Date of Birth/Sex: Treating RN: 12-30-1970 (52 y.o. 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 Facility Procedures [name], [name] ([id number]): CPT4 Code Description [id number]  [id number]-(Facility Use Only) HBOT full body chamber, , ICD-10 Diagnosis Description 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 Disruption of external operation (surgical)  wound, not elsewhere class 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 [id number] Page 2 of 2: Modifier Quantity 2 ified, subsequent encounter Physician Procedures : CPT4 Code Description Modifier [phone] - WC PHYS HYPERBARIC OXYGEN THERAPY ICD-10 Diagnosis Description 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

## 2022-11-16 ENCOUNTER — Encounter (HOSPITAL_BASED_OUTPATIENT_CLINIC_OR_DEPARTMENT_OTHER): Payer: Medicare Other | Admitting: Internal Medicine

## 2022-11-19 ENCOUNTER — Encounter (HOSPITAL_BASED_OUTPATIENT_CLINIC_OR_DEPARTMENT_OTHER): Payer: Medicare Other | Admitting: Internal Medicine

## 2022-11-19 DIAGNOSIS — T86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: Secondary | ICD-10-CM | POA: Diagnosis not present

## 2022-11-19 NOTE — Progress Notes (Signed)
CAROLEA, BROCHU ([id number]) [id number] Page 1 of 1 Visit Report for 11/19/2022 SuperBill Details Patient Name: Date of Service: [name], [name] 11/19/2022 Medical Record Number: [id number] Patient Account Number: [id number] Date of Birth/Sex: Treating RN: December 19, 1970 (52 y.o. 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

## 2022-11-19 NOTE — Progress Notes (Signed)
GEORGINA, ORMONDE ([id number]) [id number] Page 1 of 1 Visit Report for 11/19/2022 Arrival Information Details Patient Name: Date of Service: ASHLEEN, SAN 11/19/2022 9:30 A M Medical Record Number: [id number] Patient Account Number: [id number] Date of Birth/Sex: Treating RN: [birth year] (52 y.o. [name], [name] Primary Care Jaeanna Mccomber: Janece Canterbury Other Clinician: Karl Bales Referring Deontae Robson: Treating Alison Breeding/Extender: Luan Pulling in Treatment: 4 Visit Information History Since Last Visit All ordered tests and consults were completed: Yes Patient Arrived: Ambulatory Added or deleted any medications: No Arrival Time: 09:30 Any new allergies or adverse reactions: No Accompanied By: friend Had a fall or experienced change in No Transfer Assistance: None activities of daily living that may affect Patient Identification Verified: Yes risk of falls: Secondary Verification Process Completed: Yes Signs or symptoms of abuse/neglect since last visito No Patient Requires Transmission-Based Precautions: No Hospitalized since last visit: No Patient Has Alerts: No Implantable device outside of the clinic excluding No cellular tissue based products placed in the center since last visit: Pain Present Now: Yes Notes The patient is still having pain in her right ear. Dr. Mikey Bussing called to see at chamber side. Dr. Mikey Bussing stated that the patient needed to be seen by ENT before restarting her HBO treatments. , [facility] called. I spoke with the Triage Nurse. The Triage nurse at [facility] stated that she will call the patient. Electronic Signature(s) Signed: 11/19/2022 3:15:06 PM By: Karl Bales EMT Entered By: Karl Bales on 11/19/2022 15:15:05 -------------------------------------------------------------------------------- Vitals Details Patient Name: Date of Service: [name], Tyneshia 11/19/2022 9:30 A  M Medical Record Number: [id number] Patient Account Number: [id number] Date of Birth/Sex: Treating RN: 1970-07-07 (52 y.o. [name], [name] Primary Care Lakita Sahlin: Janece Canterbury Other Clinician: Karl Bales Referring Ondra Deboard: Treating Merland Holness/Extender: Luan Pulling in Treatment: 4 Vital Signs Time Taken: 10:45 Temperature (F): 97.6 Height (in): 65 Pulse (bpm): 75 Respiratory Rate (breaths/min): 18 Blood Pressure (mmHg): 100/70 Reference Range: 80 - 120 mg / dl Electronic Signature(s) Signed: 11/19/2022 3:15:28 PM By: Karl Bales EMT Entered By: Karl Bales on 11/19/2022 15:15:28

## 2022-11-20 ENCOUNTER — Encounter (HOSPITAL_BASED_OUTPATIENT_CLINIC_OR_DEPARTMENT_OTHER): Payer: Medicare Other | Admitting: Internal Medicine

## 2022-11-21 ENCOUNTER — Encounter (HOSPITAL_BASED_OUTPATIENT_CLINIC_OR_DEPARTMENT_OTHER): Payer: Medicare Other | Admitting: Physician Assistant

## 2022-11-22 ENCOUNTER — Encounter (HOSPITAL_BASED_OUTPATIENT_CLINIC_OR_DEPARTMENT_OTHER): Payer: Medicare Other | Admitting: Internal Medicine

## 2022-11-22 DIAGNOSIS — T86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: Secondary | ICD-10-CM | POA: Diagnosis not present

## 2022-11-22 DIAGNOSIS — T8131XD Disruption of external operation (surgical) wound, not elsewhere classified, subsequent encounter: Secondary | ICD-10-CM | POA: Diagnosis not present

## 2022-11-22 DIAGNOSIS — H0270 Unspecified degenerative disorders of eyelid and periocular area: Secondary | ICD-10-CM | POA: Diagnosis not present

## 2022-11-22 NOTE — Progress Notes (Addendum)
[name], [name] ([id number]) [id number] Page 1 of 2 Visit Report for 11/22/2022 Arrival Information Details Patient Name: Date of Service: [name], [name] 11/22/2022 12:00 PM Medical Record Number: [id number] Patient Account Number: [id number] Date of Birth/Sex: Treating RN: September 04, 1970 (52 y.o. [name] [name] Primary Care Krystle Polcyn: Janece Canterbury Other Clinician: Karl Bales Referring Jartavious Mckimmy: Treating Deonna Krummel/Extender: Luan Pulling in Treatment: 5 Visit Information History Since Last Visit All ordered tests and consults were completed: Yes Patient Arrived: Ambulatory Added or deleted any medications: No Arrival Time: 11:13 Any new allergies or adverse reactions: No Accompanied By: friend Had a fall or experienced change in No Transfer Assistance: None activities of daily living that may affect Patient Identification Verified: Yes risk of falls: Secondary Verification Process Completed: Yes Signs or symptoms of abuse/neglect since last visito No Patient Requires Transmission-Based Precautions: No Hospitalized since last visit: No Patient Has Alerts: No Implantable device outside of the clinic excluding No cellular tissue based products placed in the center since last visit: Pain Present Now: No Electronic Signature(s) Signed: 11/22/2022 2:03:00 PM By: Haywood Pao CHT EMT BS , , Entered By: Haywood Pao on 11/22/2022 14:03:00 -------------------------------------------------------------------------------- Encounter Discharge Information Details Patient Name: Date of Service: [name] [name] 11/22/2022 12:00 PM Medical Record Number: [id number] Patient Account Number: [id number] Date of Birth/Sex: Treating RN: Sep 24, 1970 (53 y.o. [name] [name] Primary Care Alexea Blase: Janece Canterbury Other Clinician: Karl Bales Referring Kanasia Gayman: Treating Karion Cudd/Extender: Luan Pulling in Treatment: 5 Encounter Discharge Information Items Discharge Condition: Stable Ambulatory Status: Ambulatory Discharge Destination: Other (Note Required) Transportation: Other Accompanied By: staff and friend Schedule Follow-up Appointment: No Clinical Summary of Care: Notes Patient had wound care visit after treatment. Electronic Signature(s) Signed: 11/22/2022 2:56:30 PM By: Haywood Pao CHT EMT BS , , Entered By: Haywood Pao on 11/22/2022 14:56:30 [name] ([id number]) [id number] Page 2 of 2 -------------------------------------------------------------------------------- Vitals Details Patient Name: Date of Service: [name], [name] 11/22/2022 12:00 PM Medical Record Number: [id number] Patient Account Number: [id number] Date of Birth/Sex: Treating RN: [birth year] (52 y.o. [name] [name] Primary Care Silas Muff: Janece Canterbury Other Clinician: Karl Bales Referring Charly Holcomb: Treating Gustaf Mccarter/Extender: Luan Pulling in Treatment: 5 Vital Signs Time Taken: 11:40 Temperature (F): 98.4 Height (in): 65 Pulse (bpm): 77 Respiratory Rate (breaths/min): 16 Blood Pressure (mmHg): 107/70 Reference Range: 80 - 120 mg / dl Electronic Signature(s) Signed: 11/22/2022 2:03:52 PM By: Haywood Pao CHT EMT BS , , Entered By: Haywood Pao on 11/22/2022 14:03:52

## 2022-11-22 NOTE — Progress Notes (Addendum)
[name], [name] ([id number]) [id number] Page 1 of 2 Visit Report for 11/22/2022 HBO Details Patient Name: Date of Service: [name], [name] 11/22/2022 12:00 PM Medical Record Number: [id number] Patient Account Number: [id number] Date of Birth/Sex: Treating RN: [birth year] (52 y.o. [name] Primary Care Munir Victorian: Janece Canterbury Other Clinician: Karl Bales Referring Myelle Poteat: Treating Luciana Cammarata/Extender: Luan Pulling in Treatment: 5 HBO Treatment Course Details Treatment Course Number: 1 Ordering Elric Tirado: Geralyn Corwin T Treatments Ordered: otal 20 HBO Treatment Start Date: 10/23/2022 HBO Indication: Compromised/Failed Flap/Graft to Left Forehead HBO Treatment Details Treatment Number: 13 Patient Type: Outpatient Chamber Type: Monoplace Chamber Serial #: [id number] Treatment Protocol: 2.0 ATA with 90 minutes oxygen, with two 5 minute air breaks Treatment Details Compression Rate Down: 1.0 psi / minute De-Compression Rate Up: 1.5 psi / minute A breaks and breathing ir Compress Tx Pressure periods Decompress Decompress Begins Reached (leave unused spaces Begins Ends blank) Chamber Pressure (ATA 1 2 2 2 2 2  --2 1 ) Clock Time (24 hr) 11:57 12:16 12:47 12:51 13:21 13:26 - - 13:56 14:05 Treatment Length: 128 (minutes) Treatment Segments: 4 Vital Signs Capillary Blood Glucose Reference Range: 80 - 120 mg / dl HBO Diabetic Blood Glucose Intervention Range: <131 mg/dl or >284 mg/dl Type: Time Vitals Blood Respiratory Capillary Blood Glucose Pulse Action Pulse: Temperature: Taken: Pressure: Rate: Glucose (mg/dl): Meter #: Oximetry (%) Taken: Pre 11:40 107/70 77 16 98.4 none per protocol Post 14:22 137/83 61 18 97.9 none per protocol Treatment Response Treatment Toleration: Well Treatment Completion Status: Treatment Completed without Adverse Event Treatment Notes Mrs. Hockey arrived with normal vital signs.  She prepared for treatment. After performing a safety check, she was placed in the chamber which was pressurized with 100% oxygen at a rate of one PSI per minute with full ventilation to decrease the actual travel rate to approximately 0.74 PSI per minute. She tolerated treatment and subsequent decompression of the chamber at approximately the same rate. Her post treatment vital signs were within normal range. She denied any symptoms of barotrauma and/or ear equalization issues. She was stable upon discharge to her wound care clinic visit. Physician HBO Attestation: I certify that I supervised this HBO treatment in accordance with Medicare guidelines. A trained emergency response team is readily available per Yes hospital policies and procedures. Continue HBOT as ordered. Yes Electronic Signature(s) Signed: 11/27/2022 4:51:26 PM By: Geralyn Corwin DO Signed: 11/28/2022 5:55:59 PM By: Haywood Pao CHT EMT BS , , Previous Signature: 11/23/2022 9:53:20 AM Version By: Geralyn Corwin DO Previous Signature: 11/22/2022 2:55:45 PM Version By: Haywood Pao CHT EMT BS , , Previous Signature: 11/22/2022 2:45:27 PM Version By: Haywood Pao CHT EMT BS , , Previous Signature: 11/22/2022 2:06:27 PM Version By: Haywood Pao CHT EMT BS , , Entered By: Haywood Pao on 11/23/2022 18:27:11 [name] ([id number]) [id number] Page 2 of 2 -------------------------------------------------------------------------------- HBO Safety Checklist Details Patient Name: Date of Service: [name] 11/22/2022 12:00 PM Medical Record Number: [id number] Patient Account Number: [id number] Date of Birth/Sex: Treating RN: 1970/11/12 (52 y.o. [name] Primary Care Rajendra Spiller: Janece Canterbury Other Clinician: Karl Bales Referring Eiliyah Reh: Treating Laurance Heide/Extender: Luan Pulling in Treatment: 5 HBO Safety Checklist  Items Safety Checklist Consent Form Signed Patient voided / foley secured and emptied When did you last eato breakfast Last dose of injectable or oral agent n/a Ostomy pouch emptied and vented if applicable NA All implantable devices assessed, documented and approved NA Intravenous access  site secured and place NA Valuables secured Linens and cotton and cotton/polyester blend (less than 51% polyester) Personal oil-based products / skin lotions / body lotions removed Wigs or hairpieces removed NA Smoking or tobacco materials removed NA Books / newspapers / magazines / loose paper removed Cologne, aftershave, perfume and deodorant removed Jewelry removed (may wrap wedding band) Make-up removed Hair care products removed Battery operated devices (external) removed Heating patches and chemical warmers removed Titanium eyewear removed Nail polish cured greater than 10 hours NA Casting material cured greater than 10 hours NA Hearing aids removed NA Loose dentures or partials removed NA Prosthetics have been removed NA Patient demonstrates correct use of air break device (if applicable) Patient concerns have been addressed Patient grounding bracelet on and cord attached to chamber Specifics for Inpatients (complete in addition to above) Medication sheet sent with patient NA Intravenous medications needed or due during therapy sent with patient NA Drainage tubes (e.g. nasogastric tube or chest tube secured and vented) NA Endotracheal or Tracheotomy tube secured NA Cuff deflated of air and inflated with saline NA Airway suctioned NA Notes Paper version used prior to treatment start. Electronic Signature(s) Signed: 11/22/2022 2:04:58 PM By: Haywood Pao CHT EMT BS , , Entered By: Haywood Pao on 11/22/2022 14:04:58

## 2022-11-23 ENCOUNTER — Encounter (HOSPITAL_BASED_OUTPATIENT_CLINIC_OR_DEPARTMENT_OTHER): Payer: Medicare Other | Admitting: Internal Medicine

## 2022-11-23 DIAGNOSIS — H0270 Unspecified degenerative disorders of eyelid and periocular area: Secondary | ICD-10-CM

## 2022-11-23 DIAGNOSIS — T8131XD Disruption of external operation (surgical) wound, not elsewhere classified, subsequent encounter: Secondary | ICD-10-CM | POA: Diagnosis not present

## 2022-11-23 DIAGNOSIS — T86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: Secondary | ICD-10-CM

## 2022-11-23 NOTE — Progress Notes (Signed)
TASKA, MERCURI ([id number]) [id number] Page 1 of 6 Visit Report for 11/22/2022 Arrival Information Details Patient Name: Date of Service: [name], [name] 11/22/2022 2:15 PM Medical Record Number: [id number] Patient Account Number: [id number] Date of Birth/Sex: Treating RN: 1971-02-09 (52 y.o. [name], [name] Primary Care Lariza Cothron: Janece Canterbury Other Clinician: Referring Dre Gamino: Treating Anthonette Lesage/Extender: Luan Pulling in Treatment: 5 Visit Information History Since Last Visit Added or deleted any medications: No Patient Arrived: Ambulatory Any new allergies or adverse reactions: No Arrival Time: 14:18 Had a fall or experienced change in No Accompanied By: dad activities of daily living that may affect Transfer Assistance: None risk of falls: Patient Identification Verified: Yes Signs or symptoms of abuse/neglect since last visito No Secondary Verification Process Completed: Yes Hospitalized since last visit: No Patient Requires Transmission-Based Precautions: No Implantable device outside of the clinic excluding No Patient Has Alerts: No cellular tissue based products placed in the center since last visit: Has Dressing in Place as Prescribed: Yes Pain Present Now: No Electronic Signature(s) Signed: 11/22/2022 5:44:24 PM By: Shawn Stall RN, BSN Entered By: Shawn Stall on 11/22/2022 14:19:06 -------------------------------------------------------------------------------- Lower Extremity Assessment Details Patient Name: Date of Service: [name] NELYA, [name] 11/22/2022 2:15 PM Medical Record Number: [id number] Patient Account Number: [id number] Date of Birth/Sex: Treating RN: 04-24-1971 (52 y.o. [name] Primary Care Clayborne Divis: Janece Canterbury Other Clinician: Referring Shuntel Fishburn: Treating Nickisha Hum/Extender: Luan Pulling in Treatment: 5 Electronic Signature(s) Signed: 11/22/2022 5:44:24 PM  By: Shawn Stall RN, BSN Entered By: Shawn Stall on 11/22/2022 14:19:19 -------------------------------------------------------------------------------- Multi Wound Chart Details Patient Name: Date of Service: [name], [name] 11/22/2022 2:15 PM Medical Record Number: [id number] Patient Account Number: [id number] Date of Birth/Sex: Treating RN: August 13, 1970 (52 y.o. F) Primary Care Tanyia Grabbe: Janece Canterbury Other Clinician: Referring Mellonie Guess: Treating Fusae Florio/Extender: Luan Pulling in Treatment: 5 [name] ([id number]) [id number] Page 2 of 6 Vital Signs Height(in): 65 Pulse(bpm): 61 Weight(lbs): Blood Pressure(mmHg): 137/83 Body Mass Index(BMI): Temperature(F): 97.9 Respiratory Rate(breaths/min): 16 [1:Photos:] [N/A:N/A] Left Forehead N/A N/A Wound Location: Surgical Injury N/A N/A Wounding Event: Compromised Skin Graft/Flap N/A N/A Primary Etiology: Asthma, Osteoarthritis N/A N/A Comorbid History: 10/09/2022 N/A N/A Date Acquired: 5 N/A N/A Weeks of Treatment: Open N/A N/A Wound Status: No N/A N/A Wound Recurrence: 0x0x0 N/A N/A Measurements L x W x D (cm) 0 N/A N/A A (cm) : rea 0 N/A N/A Volume (cm) : 100.00% N/A N/A % Reduction in A rea: 100.00% N/A N/A % Reduction in Volume: Full Thickness Without Exposed N/A N/A Classification: Support Structures None Present N/A N/A Exudate Amount: Distinct, outline attached N/A N/A Wound Margin: None Present (0%) N/A N/A Granulation Amount: None Present (0%) N/A N/A Necrotic Amount: Fascia: No N/A N/A Exposed Structures: Fat Layer (Subcutaneous Tissue): No Tendon: No Muscle: No Joint: No Bone: No Large (67-100%) N/A N/A Epithelialization: Excoriation: No N/A N/A Periwound Skin Texture: Induration: No Callus: No Crepitus: No Rash: No Scarring: No Maceration: No N/A N/A Periwound Skin Moisture: Dry/Scaly: No Atrophie Blanche: No N/A  N/A Periwound Skin Color: Cyanosis: No Ecchymosis: No Erythema: No Hemosiderin Staining: No Mottled: No Pallor: No Rubor: No Treatment Notes Electronic Signature(s) Signed: 11/23/2022 9:53:20 AM By: Geralyn Corwin DO Entered By: Geralyn Corwin on 11/22/2022 14:38:36 -------------------------------------------------------------------------------- Multi-Disciplinary Care Plan Details Patient Name: Date of Service: [name], [name] 11/22/2022 2:15 PM [name] ([id number]) [id number] Page 3 of 6 Medical Record Number: [id number] Patient Account Number: [id number] Date  of Birth/Sex: Treating RN: [birth year] (52 y.o. [name], [name] Primary Care Samrat Hayward: Janece Canterbury Other Clinician: Referring Caige Almeda: Treating Orian Amberg/Extender: Luan Pulling in Treatment: 5 Active Inactive HBO Nursing Diagnoses: Potential for oxygen toxicity seizures related to delivery of 100% oxygen at an increased atmospheric pressure Goals: Patient and/or family will be able to state/discuss factors appropriate to the management of their disease process during treatment Date Initiated: 10/18/2022 Target Resolution Date: 01/12/2023 Goal Status: Active Interventions: Assess for signs and symptoms related to adverse events, including but not limited to confinement anxiety, pneumothorax, oxygen toxicity and baurotrauma Notes: 11/22/2022 Wound is healed. Finish out HBO. Electronic Signature(s) Signed: 11/22/2022 5:44:24 PM By: Shawn Stall RN, BSN Entered By: Shawn Stall on 11/22/2022 14:31:51 -------------------------------------------------------------------------------- Pain Assessment Details Patient Name: Date of Service: [name], [name] 11/22/2022 2:15 PM Medical Record Number: [id number] Patient Account Number: [id number] Date of Birth/Sex: Treating RN: 11/04/1970 (52 y.o. [name] Primary Care Shaily Librizzi: Janece Canterbury Other  Clinician: Referring Gohan Collister: Treating Sharmarke Cicio/Extender: Luan Pulling in Treatment: 5 Active Problems Location of Pain Severity and Description of Pain Patient Has Paino No Site Locations Pain Management and Medication Current Pain Management: Electronic Signature(s) CLARKIE, CRUSON ([id number]) [id number] Page 4 of 6 Signed: 11/22/2022 5:44:24 PM By: Shawn Stall RN, BSN Entered By: Shawn Stall on 11/22/2022 14:19:13 -------------------------------------------------------------------------------- Patient/Caregiver Education Details Patient Name: Date of Service: [name] 7/11/2024andnbsp2:15 PM Medical Record Number: [id number] Patient Account Number: [id number] Date of Birth/Gender: Treating RN: [birth year] (52 y.o. [name] Primary Care Physician: Janece Canterbury Other Clinician: Referring Physician: Treating Physician/Extender: Luan Pulling in Treatment: 5 Education Assessment Education Provided To: Patient Education Topics Provided Wound/Skin Impairment: Handouts: Caring for Your Ulcer Methods: Explain/Verbal Responses: Reinforcements needed Electronic Signature(s) Signed: 11/22/2022 5:44:24 PM By: Shawn Stall RN, BSN Entered By: Shawn Stall on 11/22/2022 14:32:21 -------------------------------------------------------------------------------- Wound Assessment Details Patient Name: Date of Service: [name] SHERIDYN, [name] 11/22/2022 2:15 PM Medical Record Number: [id number] Patient Account Number: [id number] Date of Birth/Sex: Treating RN: [birth year] (52 y.o. [name] Primary Care Jolene Guyett: Janece Canterbury Other Clinician: Referring Allesandra Huebsch: Treating [name]/Extender: Luan Pulling in Treatment: 5 Wound Status Wound Number: 1 Primary Etiology: Compromised Skin Graft/Flap Wound Location: Left Forehead Wound Status: Open Wounding Event:  Surgical Injury Comorbid History: Asthma, Osteoarthritis Date Acquired: 10/09/2022 Weeks Of Treatment: 5 Clustered Wound: No Photos [name], [name] ([id number]) [id number] Page 5 of 6 Wound Measurements Length: (cm) Width: (cm) Depth: (cm) Area: (cm) Volume: (cm) 0 % Reduction in Area: 100% 0 % Reduction in Volume: 100% 0 Epithelialization: Large (67-100%) 0 Tunneling: No 0 Undermining: No Wound Description Classification: Full Thickness Without Exposed Support Wound Margin: Distinct, outline attached Exudate Amount: None Present Structures Foul Odor After Cleansing: No Slough/Fibrino No Wound Bed Granulation Amount: None Present (0%) Exposed Structure Necrotic Amount: None Present (0%) Fascia Exposed: No Fat Layer (Subcutaneous Tissue) Exposed: No Tendon Exposed: No Muscle Exposed: No Joint Exposed: No Bone Exposed: No Periwound Skin Texture Texture Color No Abnormalities Noted: No No Abnormalities Noted: No Callus: No Atrophie Blanche: No Crepitus: No Cyanosis: No Excoriation: No Ecchymosis: No Induration: No Erythema: No Rash: No Hemosiderin Staining: No Scarring: No Mottled: No Pallor: No Moisture Rubor: No No Abnormalities Noted: No Dry / Scaly: No Maceration: No Electronic Signature(s) Signed: 11/22/2022 5:44:24 PM By: Shawn Stall RN, BSN Entered By: Shawn Stall on 11/22/2022 14:21:46 -------------------------------------------------------------------------------- Vitals Details Patient Name: Date of Service:  [name], [name] 11/22/2022 2:15 PM Medical Record Number: [id number] Patient Account Number: [id number] Date of Birth/Sex: Treating RN: 05/30/1970 (52 y.o. [name] Primary Care Buna Cuppett: Janece Canterbury Other Clinician: Referring Antionio Negron: Treating Shere Eisenhart/Extender: Luan Pulling in Treatment: 5 Vital Signs Time Taken: 14:20 Temperature (F): 97.9 Height (in): 65 Pulse  (bpm): 61 Piotrowski, Kenitha ([id number]) [id number] Page 6 of 6 Respiratory Rate (breaths/min): 16 Blood Pressure (mmHg): 137/83 Reference Range: 80 - 120 mg / dl Electronic Signature(s) Signed: 11/22/2022 5:44:24 PM By: Shawn Stall RN, BSN Entered By: Shawn Stall on 11/22/2022 14:22:01

## 2022-11-23 NOTE — Progress Notes (Signed)
[name], [name] ([id number]) [id number] Page 1 of 6 Visit Report for 11/22/2022 Chief Complaint Document Details Patient Name: Date of Service: [name], [name] 11/22/2022 2:15 PM Medical Record Number: [id number] Patient Account Number: [id number] Date of Birth/Sex: Treating RN: 02-22-1971 (52 y.o. F) Primary Care Provider: Janece Canterbury Other Clinician: Referring Provider: Treating Provider/Extender: Luan Pulling in Treatment: 5 Information Obtained from: Patient Chief Complaint 10/18/2022; patient was referred here by plastic surgery for consideration of hyperbaric oxygen for flap preservation after surgery on 10/09/2022. Electronic Signature(s) Signed: 11/23/2022 9:53:20 AM By: Geralyn Corwin DO Entered By: Geralyn Corwin on 11/22/2022 14:38:42 -------------------------------------------------------------------------------- HPI Details Patient Name: Date of Service: [name], [place] 11/22/2022 2:15 PM Medical Record Number: [id number] Patient Account Number: [id number] Date of Birth/Sex: Treating RN: 1971/02/16 (52 y.o. F) Primary Care Provider: Janece Canterbury Other Clinician: Referring Provider: Treating Provider/Extender: Luan Pulling in Treatment: 5 History of Present Illness HPI Description: 10/18/2022 ADMISSION This is a 52 year old woman who had progressive problems with ptosis bilaterally. On 5/28 she underwent a trichophytic brow ptosis repair and upper blepharoplasty. The patient tells me that she actually did really well until about day 4. She then developed an area of necrosis on the left part of the flap. She is also developed pain along the incision line and ballotable fluid on the right edge of the surgical line/flap. She is taking Vicodin for the pain putting topical mupirocin on the incision line. She was on warm compresses as well. She was on Augmentin but I believe she is finished  this. She does not complain of any visual disturbances. No fever. She has been referred here by plastic surgery for consideration of hyperbaric oxygen for flap necrosis/failed flap Past medical history includes multiple sclerosis, hypothyroidism, cervical and lumbar spondylosis and hypertension 6/13; patient presents for follow-up. She started hyperbaric oxygen therapy on 6/11 and has tolerated it well. She has no issues or complaints today. She has a small necrotic wound on the left front side of her head from a failed flap. She has been placing antibiotic ointment to this area. She has no issues or complaints today. Area is smaller today. 6/27; patient presents for follow-up. She continues to do hyperbaric oxygen therapy and is tolerating this well. She has no issues or complaints today. She has been using antibiotic ointment to the necrotic wound and this is smaller with healthier tissue present. Overall she is improving nicely. 7/11; patient presents for follow-up. She has been doing hyperbaric oxygen therapy. She has completed 13 sessions. She did end up needing tubes in her ears as a result of barotrauma from hyperbaric oxygen therapy. However she has been doing well. I checked her ears prior to treatment today and the tympanic membrane was clear with tubes in place with no obvious irritation. Her wound on her forehead has healed. She has been using scar cream daily with SPF. Electronic Signature(s) Signed: 11/23/2022 9:53:20 AM By: Geralyn Corwin DO Entered By: Geralyn Corwin on 11/22/2022 14:40:44 [name] ([id number]) [id number] Page 2 of 6 -------------------------------------------------------------------------------- Physical Exam Details Patient Name: Date of Service: [name], [name] 11/22/2022 2:15 PM Medical Record Number: [id number] Patient Account Number: [id number] Date of Birth/Sex: Treating RN: 1970/06/04 (52 y.o. F) Primary Care  Provider: Janece Canterbury Other Clinician: Referring Provider: Treating Provider/Extender: Luan Pulling in Treatment: 5 Constitutional respirations regular, non-labored and within target range for patient.. Cardiovascular 2+ dorsalis pedis/posterior tibialis pulses. Psychiatric pleasant and cooperative.  Notes Epithelization along the incision line and previous necrotic site on her forehead. Electronic Signature(s) Signed: 11/23/2022 9:53:20 AM By: Geralyn Corwin DO Entered By: Geralyn Corwin on 11/22/2022 14:41:25 -------------------------------------------------------------------------------- Physician Orders Details Patient Name: Date of Service: [name], [name] 11/22/2022 2:15 PM Medical Record Number: [id number] Patient Account Number: [id number] Date of Birth/Sex: Treating RN: 1971/02/28 (52 y.o. 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 Mikey Bussing Return A Other: - Finish out hyperbarics. continue to apply sun screen and antibiotic ointment. Electronic Signature(s) Signed: 11/23/2022 9:53:20 AM By: Geralyn Corwin DO Entered By: Geralyn Corwin on 11/22/2022 14:41:31 [name] ([id number]) [id number] Page 3 of 6 -------------------------------------------------------------------------------- Problem List Details Patient Name: Date of Service: [place], [name] 11/22/2022 2:15 PM Medical Record Number:  [id number] Patient Account Number: [id number] Date of Birth/Sex: Treating RN: 1970-07-18 (52 y.o. 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 F) Primary Care Provider: Janece Canterbury Other Clinician: Referring Provider: Treating Provider/Extender: Luan Pulling in Treatment: 5 Subjective Chief Complaint Information obtained from Patient 10/18/2022; patient was referred here by plastic surgery for consideration of hyperbaric oxygen for flap preservation after surgery on 10/09/2022. History of Present Illness (HPI) 10/18/2022 ADMISSION This is a 52 year old woman who had progressive problems with ptosis bilaterally. On 5/28 she underwent a trichophytic brow ptosis repair and upper blepharoplasty. The patient tells me that she actually did really well until about day 4. She then developed an area of necrosis on the left part of the flap. She is also developed pain along the incision line and ballotable fluid on the right edge of the surgical  line/flap. She is taking Vicodin for the pain putting topical mupirocin on the incision line. She was on warm compresses as well. She was on Augmentin but I believe she is finished this. She does not complain of any visual disturbances. No fever. She has been referred here by plastic surgery for consideration of hyperbaric oxygen for flap necrosis/failed flap JAHNEE, KUBIAK ([id number]) [id number] Page 4 of 6 Past medical history includes multiple sclerosis, hypothyroidism, cervical and lumbar spondylosis and hypertension 6/13; patient presents for follow-up. She started hyperbaric oxygen therapy on 6/11 and has tolerated it well. She has no issues or complaints today. She has a small necrotic wound on the left front side of her head from a failed flap. She has been placing antibiotic ointment to this area. She has no issues or complaints today. Area is smaller today. 6/27;  patient presents for follow-up. She continues to do hyperbaric oxygen therapy and is tolerating this well. She has no issues or complaints today. She has been using antibiotic ointment to the necrotic wound and this is smaller with healthier tissue present. Overall she is improving nicely. 7/11; patient presents for follow-up. She has been doing hyperbaric oxygen therapy. She has completed 13 sessions. She did end up needing tubes in her ears as a result of barotrauma from hyperbaric oxygen therapy. However she has been doing well. I checked her ears prior to treatment today and the tympanic membrane was clear with tubes in place with no obvious irritation. Her wound on her forehead has healed. She has been using scar cream daily with SPF. Patient History Family History No family history of Cancer, Diabetes, Heart Disease, Hereditary Spherocytosis, Hypertension, Kidney Disease, Lung Disease, Seizures, Stroke, Thyroid Problems, Tuberculosis. Social History Former smoker - quit - ended on 08/28/2022,  Marital Status - Divorced, Alcohol Use - Never, Drug Use - No History. Medical History Respiratory Patient has history of Asthma Musculoskeletal Patient has history of Osteoarthritis Hospitalization/Surgery History - left knee surgery x2 2019 and 2019. - hernia repair. - cholecystectomy. - ORIF left radial. - carpel tunnel release. - rotator cuff repair. - 10/09/2022 bilateral upper eyelids ptosis, trichophytic brow ptosis repair and upper blepharophasty. Medical A Surgical History Notes nd Constitutional Symptoms (General Health) hypothyroidism Musculoskeletal MS fibromyalgia neuromuscular disorder Psychiatric Depression Objective Constitutional respirations regular, non-labored and within target range for patient.. Vitals Time Taken: 2:20 PM, Height: 65 in, Temperature: 97.9 F, Pulse: 61 bpm, Respiratory Rate: 16 breaths/min, Blood Pressure: 137/83 mmHg. Cardiovascular 2+ dorsalis pedis/posterior tibialis pulses. Psychiatric pleasant and cooperative. General Notes: Epithelization along the incision line and previous necrotic site on her forehead. Integumentary (Hair, Skin) Wound #1 status is Open. Original cause of wound was Surgical Injury. The date acquired was: 10/09/2022. The wound has been in treatment 5 weeks. The wound is located on the Left Forehead. The wound measures 0cm length x 0cm width x 0cm depth; 0cm^2 area and 0cm^3 volume. There is no tunneling or undermining noted. There is a none present amount of drainage noted. The wound margin is distinct with the outline attached to the wound base. There is no granulation within the wound bed. There is no necrotic tissue within the wound bed. The periwound skin appearance did not exhibit: Callus, Crepitus, Excoriation, Induration, Rash, Scarring, Dry/Scaly, Maceration, Atrophie Blanche, Cyanosis, Ecchymosis, Hemosiderin Staining, Mottled, Pallor, Rubor, Erythema. 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 Her wounds have healed. She has 7 remaining treatments. I recommended completing her 20 approved treatments so that she has continued healing. Continue scar cream to the previous incision site. Plan Follow-up Appointments: Return Appointment in: - 12/03/2022 1230 room 6 Dr. Mikey Bussing Other: - Finish out hyperbarics. continue to apply sun screen and antibiotic ointment. 1. Scar cream daily 2. Continue hyperbaric oxygen therapy 3. Follow-up in 1-2 weeks Electronic Signature(s) Signed: 11/23/2022 9:53:20 AM By: Geralyn Corwin DO Entered By: Geralyn Corwin on 11/22/2022 14:43:45 -------------------------------------------------------------------------------- HxROS Details Patient Name: Date of Service: [name], Karman 11/22/2022 2:15 PM Medical Record Number: [id number] Patient Account Number: [id number] Date of Birth/Sex: Treating RN: 07/30/1970 (52 y.o. F) Primary Care Provider: Janece Canterbury Other Clinician: Referring Provider: Treating  Provider/Extender: Luan Pulling in Treatment: 5 Constitutional Symptoms (General Health) Medical History: Past Medical History Notes: hypothyroidism Respiratory Medical History: Positive for: Asthma Musculoskeletal Medical History: Positive for: Osteoarthritis Past Medical History Notes: MS fibromyalgia neuromuscular disorder Psychiatric Medical History: Past Medical History Notes: Depression Immunizations Pneumococcal Vaccine: Received Pneumococcal Vaccination: No Implantable Devices No devices added Hospitalization / Surgery History Type of Hospitalization/Surgery left knee surgery x2 2019  and 2019 hernia repair cholecystectomy SUZETTA, LOTZE ([id number]) [id number] Page 6 of 6 ORIF left radial carpel tunnel release rotator cuff repair 10/09/2022 bilateral upper eyelids ptosis, trichophytic brow ptosis repair and upper blepharophasty Family and Social History Cancer: No; Diabetes: No; Heart Disease: No; Hereditary Spherocytosis: No; Hypertension: No; Kidney Disease: No; Lung Disease: No; Seizures: No; Stroke: No; Thyroid Problems: No; Tuberculosis: No; Former smoker - quit - ended on 08/28/2022; Marital Status - Divorced; Alcohol Use: Never; Drug Use: No History; Financial Concerns: No; Food, Clothing or Shelter Needs: No; Support System Lacking: No; Transportation Concerns: No Electronic Signature(s) Signed: 11/23/2022 9:53:20 AM By: Geralyn Corwin DO Entered By: Geralyn Corwin on 11/22/2022 14:40:48 -------------------------------------------------------------------------------- SuperBill Details Patient Name: Date of Service: [name], [place] 11/22/2022 Medical Record Number: [id number] Patient Account Number: [id number] Date of Birth/Sex: Treating RN: [birth year] (52 y.o. 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

## 2022-11-23 NOTE — Progress Notes (Addendum)
[name], [name] ([id number]) [id number] Page 1 of 2 Visit Report for 11/23/2022 HBO Details Patient Name: Date of Service: [name], [name] 11/23/2022 10:00 A M Medical Record Number: [id number] Patient Account Number: [id number] Date of Birth/Sex: Treating RN: 1970-08-10 (52 y.o. [name], [name] Primary Care Kayah Hecker: Janece Canterbury Other Clinician: Haywood Pao Referring Orissa Arreaga: Treating Nethan Caudillo/Extender: Luan Pulling in Treatment: 5 HBO Treatment Course Details Treatment Course Number: 1 Ordering Akshaya Toepfer: Geralyn Corwin T Treatments Ordered: otal 20 HBO Treatment Start Date: 10/23/2022 HBO Indication: Compromised/Failed Flap/Graft to Left Forehead HBO Treatment Details Treatment Number: 14 Patient Type: Outpatient Chamber Type: Monoplace Chamber Serial #: [id number] Treatment Protocol: 2.0 ATA with 90 minutes oxygen, with two 5 minute air breaks Treatment Details Compression Rate Down: 1.0 psi / minute De-Compression Rate Up: 1.0 psi / minute A breaks and breathing ir Compress Tx Pressure periods Decompress Decompress Begins Reached (leave unused spaces Begins Ends blank) Chamber Pressure (ATA 1 2 2 2 2 2  --2 1 ) Clock Time (24 hr) 09:53 10:13 10:43 10:48 11:18 11:23 - - 11:53 12:01 Treatment Length: 128 (minutes) Treatment Segments: 4 Vital Signs Capillary Blood Glucose Reference Range: 80 - 120 mg / dl HBO Diabetic Blood Glucose Intervention Range: <131 mg/dl or >846 mg/dl Type: Time Vitals Blood Respiratory Capillary Blood Glucose Pulse Action Pulse: Temperature: Taken: Pressure: Rate: Glucose (mg/dl): Meter #: Oximetry (%) Taken: Pre 09:25 118/82 74 16 98.2 none per protocol Post 12:13 138/92 52 16 97.6 none per protocol Treatment Response Treatment Toleration: Well Treatment Completion Status: Treatment Completed without Adverse Event Treatment Notes [name] arrived with normal vital signs.  She prepared for treatment. After performing a safety check, she was placed in the chamber which was pressurized with 100% oxygen at a rate of one PSI per minute with full ventilation to decrease the actual travel rate to approximately 0.74 PSI per minute. She tolerated treatment and subsequent decompression of the chamber at approximately the same rate. Her post treatment vital signs were within normal range with the exception of heart rate of 52 bpm. She denied symptoms associated with bradycardia. She denied any symptoms of barotrauma and/or ear equalization issues. She was stable upon discharge with her friend driving. Physician HBO Attestation: I certify that I supervised this HBO treatment in accordance with Medicare guidelines. A trained emergency response team is readily available per Yes hospital policies and procedures. Continue HBOT as ordered. Yes Electronic Signature(s) Signed: 11/27/2022 4:51:26 PM By: Geralyn Corwin DO Previous Signature: 11/23/2022 1:23:40 PM Version By: Haywood Pao CHT EMT BS , , Entered By: Geralyn Corwin on 11/26/2022 10:04:13 [name] ([id number]) [id number] Page 2 of 2 -------------------------------------------------------------------------------- HBO Safety Checklist Details Patient Name: Date of Service: MINEOLA, DUAN 11/23/2022 10:00 A M Medical Record Number: [id number] Patient Account Number: [id number] Date of Birth/Sex: Treating RN: March 30, 1971 (52 y.o. [name], [name] Primary Care Brinkley Peet: Janece Canterbury Other Clinician: Haywood Pao Referring Soua Caltagirone: Treating Miho Monda/Extender: Luan Pulling in Treatment: 5 HBO Safety Checklist Items Safety Checklist Consent Form Signed Patient voided / foley secured and emptied When did you last eato Breakfast Last dose of injectable or oral agent n/a Ostomy pouch emptied and vented if applicable NA All implantable devices  assessed, documented and approved NA Intravenous access site secured and place NA Valuables secured Linens and cotton and cotton/polyester blend (less than 51% polyester) Personal oil-based products / skin lotions / body lotions removed Wigs or hairpieces removed NA Smoking or tobacco  materials removed NA Books / newspapers / magazines / loose paper removed Cologne, aftershave, perfume and deodorant removed Jewelry removed (may wrap wedding band) Make-up removed Hair care products removed Battery operated devices (external) removed Heating patches and chemical warmers removed Titanium eyewear removed Nail polish cured greater than 10 hours Casting material cured greater than 10 hours NA Hearing aids removed NA Loose dentures or partials removed NA Prosthetics have been removed NA Patient demonstrates correct use of air break device (if applicable) Patient concerns have been addressed Patient grounding bracelet on and cord attached to chamber Specifics for Inpatients (complete in addition to above) Medication sheet sent with patient NA Intravenous medications needed or due during therapy sent with patient NA Drainage tubes (e.g. nasogastric tube or chest tube secured and vented) NA Endotracheal or Tracheotomy tube secured NA Cuff deflated of air and inflated with saline NA Airway suctioned NA Notes Paper version used prior to treatment start. Electronic Signature(s) Signed: 11/23/2022 11:01:52 AM By: Haywood Pao CHT EMT BS , , Entered By: Haywood Pao on 11/23/2022 11:01:52

## 2022-11-23 NOTE — Progress Notes (Addendum)
AZALIYAH, PATLAN ([id number]) [id number] Page 1 of 2 Visit Report for 11/23/2022 Arrival Information Details Patient Name: Date of Service: AELIANA, HULL 11/23/2022 10:00 A M Medical Record Number: [id number] Patient Account Number: [id number] Date of Birth/Sex: Treating RN: 11-21-1970 (52 y.o. [name], [name] Primary Care Prue Lingenfelter: Janece Canterbury Other Clinician: Haywood Pao Referring Reba Hulett: Treating Madai Nuccio/Extender: Luan Pulling in Treatment: 5 Visit Information History Since Last Visit All ordered tests and consults were completed: Yes Patient Arrived: Dan Humphreys Added or deleted any medications: No Arrival Time: 09:13 Any new allergies or adverse reactions: No Accompanied By: self Had a fall or experienced change in No Transfer Assistance: None activities of daily living that may affect Patient Identification Verified: Yes risk of falls: Secondary Verification Process Completed: Yes Signs or symptoms of abuse/neglect since last visito No Patient Requires Transmission-Based Precautions: No Hospitalized since last visit: No Patient Has Alerts: No Implantable device outside of the clinic excluding No cellular tissue based products placed in the center since last visit: Pain Present Now: No Electronic Signature(s) Signed: 11/23/2022 1:24:57 PM By: Haywood Pao CHT EMT BS , , Previous Signature: 11/23/2022 10:58:27 AM Version By: Haywood Pao CHT EMT BS , , Entered By: Haywood Pao on 11/23/2022 13:24:57 -------------------------------------------------------------------------------- Encounter Discharge Information Details Patient Name: Date of Service: [name], Lille 11/23/2022 10:00 A M Medical Record Number: [id number] Patient Account Number: [id number] Date of Birth/Sex: Treating RN: [birth year] (52 y.o. Arta Silence Primary Care Maykayla Highley: Janece Canterbury Other Clinician: Haywood Pao Referring Leotta Weingarten: Treating Ronni Osterberg/Extender: Luan Pulling in Treatment: 5 Encounter Discharge Information Items Discharge Condition: Stable Ambulatory Status: Ambulatory Discharge Destination: Home Transportation: Private Auto Accompanied By: friend Schedule Follow-up Appointment: No Clinical Summary of Care: Electronic Signature(s) Signed: 11/23/2022 1:24:35 PM By: Haywood Pao CHT EMT BS , , Entered By: Haywood Pao on 11/23/2022 13:24:35 [name] ([id number]) [id number] Page 2 of 2 -------------------------------------------------------------------------------- Vitals Details Patient Name: Date of Service: SIOUX, ASTUDILLO 11/23/2022 10:00 A M Medical Record Number: [id number] Patient Account Number: [id number] Date of Birth/Sex: Treating RN: June 22, 1970 (52 y.o. [name], [name] Primary Care Clella Mckeel: Janece Canterbury Other Clinician: Haywood Pao Referring Bani Gianfrancesco: Treating Nils Thor/Extender: Luan Pulling in Treatment: 5 Vital Signs Time Taken: 09:25 Temperature (F): 98.2 Height (in): 65 Pulse (bpm): 74 Respiratory Rate (breaths/min): 16 Blood Pressure (mmHg): 118/82 Reference Range: 80 - 120 mg / dl Electronic Signature(s) Signed: 11/23/2022 11:00:09 AM By: Haywood Pao CHT EMT BS , , Entered By: Haywood Pao on 11/23/2022 11:00:09

## 2022-11-26 ENCOUNTER — Encounter (HOSPITAL_BASED_OUTPATIENT_CLINIC_OR_DEPARTMENT_OTHER): Payer: Medicare Other | Admitting: Internal Medicine

## 2022-11-27 ENCOUNTER — Encounter (HOSPITAL_BASED_OUTPATIENT_CLINIC_OR_DEPARTMENT_OTHER): Payer: Medicare Other | Admitting: Internal Medicine

## 2022-11-27 NOTE — Progress Notes (Signed)
LUNAH, LOSASSO ([id number]) [id number] Page 1 of 1 Visit Report for 11/23/2022 SuperBill Details Patient Name: Date of Service: KALEEA, PENNER 11/23/2022 Medical Record Number: [id number] Patient Account Number: [id number] Date of Birth/Sex: Treating RN: Mar 09, 1971 (52 y.o. 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 Facility Procedures CPT4 Code Description Modifier Quantity [id number] G0277-(Facility Use Only) HBOT full body chamber, , 4 ICD-10 Diagnosis Description 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 Disruption of external operation (surgical) wound, not elsewhere classified, subsequent encounter 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

## 2022-11-28 ENCOUNTER — Encounter (HOSPITAL_BASED_OUTPATIENT_CLINIC_OR_DEPARTMENT_OTHER): Payer: Medicare Other | Admitting: Physician Assistant

## 2022-11-29 ENCOUNTER — Encounter (HOSPITAL_BASED_OUTPATIENT_CLINIC_OR_DEPARTMENT_OTHER): Payer: Medicare Other | Admitting: Internal Medicine

## 2022-11-29 DIAGNOSIS — T86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: Secondary | ICD-10-CM | POA: Diagnosis not present

## 2022-11-29 NOTE — Progress Notes (Signed)
[name], [name] ([id number]) [id number] Page 1 of 1 Visit Report for 11/22/2022 SuperBill Details Patient Name: Date of Service: [name], [name] 11/22/2022 Medical Record Number: [id number] Patient Account Number: [id number] Date of Birth/Sex: Treating RN: 1970/09/19 (52 y.o. 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 Disruption of external operation (surgical) wound, not elsewhere classified, subsequent encounter 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

## 2022-11-29 NOTE — Progress Notes (Signed)
[name], [name] ([id number]) [id number] Page 1 of 2 Visit Report for 11/29/2022 HBO Details Patient Name: Date of Service: [name], [name] 11/29/2022 10:00 A M Medical Record Number: [id number] Patient Account Number: [id number] Date of Birth/Sex: Treating RN: 01-Oct-1970 (52 y.o. [name], [name] Primary Care Dejanee Thibeaux: Janece Canterbury Other Clinician: Haywood Pao Referring Mercia Dowe: Treating Felipe Paluch/Extender: Orpah Melter in Treatment: 6 HBO Treatment Course Details Treatment Course Number: 1 Ordering Leiloni Smithers: Geralyn Corwin T Treatments Ordered: otal 20 HBO Treatment Start Date: 10/23/2022 HBO Indication: Compromised/Failed Flap/Graft to Left Forehead HBO Treatment Details Treatment Number: 15 Patient Type: Outpatient Chamber Type: Monoplace Chamber Serial #: [id number] Treatment Protocol: 2.0 ATA with 90 minutes oxygen, with two 5 minute air breaks Treatment Details Compression Rate Down: 1.5 psi / minute De-Compression Rate Up: 2.0 psi / minute A breaks and breathing ir Compress Tx Pressure periods Decompress Decompress Begins Reached (leave unused spaces Begins Ends blank) Chamber Pressure (ATA 1 2 2 2 2 2  --2 1 ) Clock Time (24 hr) 11:14 11:28 11:58 12:03 12:33 12:38 - - 13:08 13:16 Treatment Length: 122 (minutes) Treatment Segments: 4 Vital Signs Capillary Blood Glucose Reference Range: 80 - 120 mg / dl HBO Diabetic Blood Glucose Intervention Range: <131 mg/dl or >962 mg/dl Type: Time Vitals Blood Respiratory Capillary Blood Glucose Pulse Action Pulse: Temperature: Taken: Pressure: Rate: Glucose (mg/dl): Meter #: Oximetry (%) Taken: Pre 10:52 119/75 108 16 97.9 none per protocol Post 13:19 101/64 52 16 97 Treatment Response Treatment Toleration: Well Treatment Completion Status: Treatment Completed without Adverse Event Treatment Notes [name] arrived with normal vital signs. Her heart rate was  elevated due to emotion related to social factors prior to today (death in the family). She prepared for treatment. After performing a safety check, she was placed in the chamber which was pressurized with 100% oxygen at a rate of 1.5 psi/min upon confirmation of normal ear equalization. She tolerated treatment and subsequent decompression of the chamber at approximately the same rate. She did state that she could feel her sinuses with a little pressure today. I educated her to let us know if that happens so we can slow our travel rate. Her post treatment vital signs were within normal range with the exception of heart rate of 52 bpm. She denied symptoms associated with bradycardia. She was stable upon discharge with her friend driving. Montey Ebel Notes No concerns with treatment given Physician HBO Attestation: I certify that I supervised this HBO treatment in accordance with Medicare guidelines. A trained emergency response team is readily available per Yes hospital policies and procedures. Continue HBOT as ordered. Yes Electronic Signature(s) Signed: 12/03/2022 10:28:49 AM By: Baltazar Najjar MD Previous Signature: 11/29/2022 4:22:28 PM Version By: Haywood Pao CHT EMT BS , , Entered By: Baltazar Najjar on 12/03/2022 10:27:02 [name] ([id number]) [id number] Page 2 of 2 -------------------------------------------------------------------------------- HBO Safety Checklist Details Patient Name: Date of Service: [name], [name] 11/29/2022 10:00 A M Medical Record Number: [id number] Patient Account Number: [id number] Date of Birth/Sex: Treating RN: March 14, 1971 (52 y.o. [name], [name] Primary Care [name]: Janece Canterbury Other Clinician: Haywood Pao Referring Takila Kronberg: Treating [name]/Extender: Orpah Melter in Treatment: 6 HBO Safety Checklist Items Safety Checklist Consent Form Signed Patient voided / foley secured and  emptied When did you last eato Breakfast Last dose of injectable or oral agent n/a Ostomy pouch emptied and vented if applicable NA All implantable devices assessed, documented and approved NA Intravenous access site secured  and place NA Valuables secured Linens and cotton and cotton/polyester blend (less than 51% polyester) Personal oil-based products / skin lotions / body lotions removed Wigs or hairpieces removed NA Smoking or tobacco materials removed NA Books / newspapers / magazines / loose paper removed Cologne, aftershave, perfume and deodorant removed Jewelry removed (may wrap wedding band) Make-up removed Hair care products removed Battery operated devices (external) removed Heating patches and chemical warmers removed Titanium eyewear removed Nail polish cured greater than 10 hours greater than 10 hours Casting material cured greater than 10 hours NA Hearing aids removed NA Loose dentures or partials removed NA Prosthetics have been removed NA Patient demonstrates correct use of air break device (if applicable) Patient concerns have been addressed Patient grounding bracelet on and cord attached to chamber Specifics for Inpatients (complete in addition to above) Medication sheet sent with patient NA Intravenous medications needed or due during therapy sent with patient NA Drainage tubes (e.g. nasogastric tube or chest tube secured and vented) NA Endotracheal or Tracheotomy tube secured NA Cuff deflated of air and inflated with saline NA Airway suctioned NA Notes Paper version used prior to treatment start. Electronic Signature(s) Signed: 11/29/2022 4:15:02 PM By: Haywood Pao CHT EMT BS , , Entered By: Haywood Pao on 11/29/2022 16:15:02

## 2022-11-29 NOTE — Progress Notes (Signed)
[name], [name] ([id number]) [id number] Page 1 of 2 Visit Report for 11/29/2022 Arrival Information Details Patient Name: Date of Service: [name] 11/29/2022 10:00 A M Medical Record Number: [id number] Patient Account Number: [id number] Date of Birth/Sex: Treating RN: [name] (52 y.o. [name], [name] Primary Care [name] Other Clinician: Haywood [name] Referring [name]: Treating [name]/Extender: [name] in Treatment: 6 Visit Information History Since Last Visit All ordered tests and consults were completed: Yes Patient Arrived: Ambulatory Added or deleted any medications: No Arrival Time: 10:20 Any new allergies or adverse reactions: No Accompanied By: friend Had a fall or experienced change in No Transfer Assistance: None activities of daily living that may affect Patient Identification Verified: Yes risk of falls: Secondary Verification Process Completed: Yes Signs or symptoms of abuse/neglect since last visito No Patient Requires Transmission-Based Precautions: No Hospitalized since last visit: No Patient Has Alerts: No Implantable device outside of the clinic excluding No cellular tissue based products placed in the center since last visit: Pain Present Now: No Electronic Signature(s) Signed: 11/29/2022 4:07:35 PM By: [name] CHT EMT BS , , Entered By: [name] on 11/29/2022 16:07:34 -------------------------------------------------------------------------------- Encounter Discharge Information Details Patient Name: Date of Service: [name], [name] 11/29/2022 10:00 A M Medical Record Number: [id number] Patient Account Number: [id number] Date of Birth/Sex: Treating RN: [name] (52 y.o. [name] Primary Care Saanvi Hakala: [name] Other Clinician: Haywood [name] Referring Braelynne Garinger: Treating [name]/Extender: [name] in Treatment: 6 Encounter Discharge Information Items Discharge Condition: Stable Ambulatory Status: Ambulatory Discharge Destination: Home Transportation: Private Auto Accompanied By: friend Schedule Follow-up Appointment: No Clinical Summary of Care: Electronic Signature(s) Signed: 11/29/2022 4:23:24 PM By: [name] CHT EMT BS , , Entered By: [name] on 11/29/2022 16:23:24 [name] ([id number]) [id number] Page 2 of 2 -------------------------------------------------------------------------------- Vitals Details Patient Name: Date of Service: [name], [name] 11/29/2022 10:00 A M Medical Record Number: [id number] Patient Account Number: [id number] Date of Birth/Sex: Treating RN: [name] (52 y.o. [name], [name] Primary Care [name] Other Clinician: Haywood [name] Referring Dorr Perrot: Treating Zalayah Pizzuto/Extender: [name] in Treatment: 6 Vital Signs Time Taken: 10:52 Temperature (F): 97.9 Height (in): 65 Pulse (bpm): 108 Respiratory Rate (breaths/min): 16 Blood Pressure (mmHg): 119/75 Reference Range: 80 - 120 mg / dl Electronic Signature(s) Signed: 11/29/2022 4:11:38 PM By: [name] CHT EMT BS , , Entered By: [name] on 11/29/2022 16:11:38

## 2022-11-30 ENCOUNTER — Encounter (HOSPITAL_BASED_OUTPATIENT_CLINIC_OR_DEPARTMENT_OTHER): Payer: Medicare Other | Admitting: Internal Medicine

## 2022-11-30 DIAGNOSIS — T8131XD Disruption of external operation (surgical) wound, not elsewhere classified, subsequent encounter: Secondary | ICD-10-CM

## 2022-11-30 DIAGNOSIS — T86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: Secondary | ICD-10-CM | POA: Diagnosis not present

## 2022-11-30 DIAGNOSIS — H0270 Unspecified degenerative disorders of eyelid and periocular area: Secondary | ICD-10-CM | POA: Diagnosis not present

## 2022-12-01 NOTE — Progress Notes (Signed)
[name], [name] ([id number]) [id number] Page 1 of 2 Visit Report for 11/30/2022 HBO Details Patient Name: Date of Service: [name], [name] 11/30/2022 9:30 A M Medical Record Number: [id number] Patient Account Number: [id number] Date of Birth/Sex: Treating RN: 1970-08-28 (52 y.o. [name] Primary Care Kartik Fernando: [name] Other Clinician: Haywood [name] Referring Sou Nohr: Treating [name] in Treatment: 6 HBO Treatment Course Details Treatment Course Number: 1 Ordering [name] T Treatments Ordered: otal 20 HBO Treatment Start Date: 10/23/2022 HBO Indication: Compromised/Failed Flap/Graft to Left Forehead HBO Treatment Details Treatment Number: 16 Patient Type: Outpatient Chamber Type: Monoplace Chamber Serial #: [id number] Treatment Protocol: 2.0 ATA with 90 minutes oxygen, with two 5 minute air breaks Treatment Details Compression Rate Down: 1.0 psi / minute De-Compression Rate Up: 1.5 psi / minute A breaks and breathing ir Compress Tx Pressure periods Decompress Decompress Begins Reached (leave unused spaces Begins Ends blank) Chamber Pressure (ATA 1 2 2 2 2 2  --2 1 ) Clock Time (24 hr) 09:54 10:10 10:40 10:45 11:15 11:20 - - 11:50 12:00 Treatment Length: 126 (minutes) Treatment Segments: 4 Vital Signs Capillary Blood Glucose Reference Range: 80 - 120 mg / dl HBO Diabetic Blood Glucose Intervention Range: <131 mg/dl or >629 mg/dl Type: Time Vitals Blood Respiratory Capillary Blood Glucose Pulse Action Pulse: Temperature: Taken: Pressure: Rate: Glucose (mg/dl): Meter #: Oximetry (%) Taken: Pre 09:36 111/80 78 16 98.1 none per protocol Post 12:03 123/82 54 16 97.5 Treatment Response Treatment Toleration: Well Treatment Completion Status: Treatment Completed without Adverse Event Treatment Notes [name] arrived with normal vital signs. She prepared for  treatment. After performing a safety check, she was placed in the chamber which was pressurized with 100% oxygen at a rate of 1.5 psi/min upon confirmation of normal ear equalization. She tolerated treatment and subsequent decompression of the chamber at approximately the same rate. Her post treatment vital signs were within normal range with the exception of heart rate of 54 bpm. She denied symptoms associated with bradycardia. She was stable upon discharge with her friend. Physician HBO Attestation: I certify that I supervised this HBO treatment in accordance with Medicare guidelines. A trained emergency response team is readily available per Yes hospital policies and procedures. Continue HBOT as ordered. Yes Electronic Signature(s) Signed: 12/04/2022 4:26:53 PM By: [name] Corwin DO Previous Signature: 11/30/2022 3:17:42 PM Version By: [name] CHT EMT BS , , Entered By: [name] on 12/03/2022 16:03:30 [name] ([id number]) [id number] Page 2 of 2 -------------------------------------------------------------------------------- HBO Safety Checklist Details Patient Name: Date of Service: [name], [name] 11/30/2022 9:30 A M Medical Record Number: [id number] Patient Account Number: [id number] Date of Birth/Sex: Treating RN: 07-Dec-1970 (52 y.o. [name] Primary Care [name] Other Clinician: Haywood [name] Referring [name]: Treating [name]/Extender: [name] in Treatment: 6 HBO Safety Checklist Items Safety Checklist Consent Form Signed Patient voided / foley secured and emptied When did you last eato Breakfast Last dose of injectable or oral agent N/A Ostomy pouch emptied and vented if applicable NA All implantable devices assessed, documented and approved NA Intravenous access site secured and place NA Valuables secured Linens and cotton and cotton/polyester blend  (less than 51% polyester) Personal oil-based products / skin lotions / body lotions removed Wigs or hairpieces removed NA Smoking or tobacco materials removed NA Books / newspapers / magazines / loose paper removed Cologne, aftershave, perfume and deodorant removed Jewelry removed (may wrap wedding band) Make-up  removed Hair care products removed Battery operated devices (external) removed Heating patches and chemical warmers removed Titanium eyewear removed Nail polish cured greater than 10 hours greater than 10 hours Casting material cured greater than 10 hours NA Hearing aids removed NA Loose dentures or partials removed NA Prosthetics have been removed NA Patient demonstrates correct use of air break device (if applicable) Patient concerns have been addressed Patient grounding bracelet on and cord attached to chamber Specifics for Inpatients (complete in addition to above) Medication sheet sent with patient NA Intravenous medications needed or due during therapy sent with patient NA Drainage tubes (e.g. nasogastric tube or chest tube secured and vented) NA Endotracheal or Tracheotomy tube secured NA Cuff deflated of air and inflated with saline NA Airway suctioned NA Notes Paper version used prior to treatment start. Electronic Signature(s) Signed: 11/30/2022 3:15:03 PM By: [name] CHT EMT BS , , Entered By: [name] on 11/30/2022 15:15:03

## 2022-12-01 NOTE — Progress Notes (Signed)
[name] ([id number]) [id number] Page 1 of 2 Visit Report for 11/30/2022 Arrival Information Details Patient Name: Date of Service: [name], [name] 11/30/2022 9:30 A M Medical Record Number: [id number] Patient Account Number: [id number] Date of Birth/Sex: Treating RN: January 25, 1971 (52 y.o. [name] Primary Care [name] Other Clinician: Haywood [name] Referring [name]: Treating [name]/Extender: [name] in Treatment: 6 Visit Information History Since Last Visit All ordered tests and consults were completed: Yes Patient Arrived: Ambulatory Added or deleted any medications: No Arrival Time: 09:16 Any new allergies or adverse reactions: No Accompanied By: friend Had a fall or experienced change in No Transfer Assistance: None activities of daily living that may affect Patient Identification Verified: Yes risk of falls: Secondary Verification Process Completed: Yes Signs or symptoms of abuse/neglect since last visito No Patient Requires Transmission-Based Precautions: No Hospitalized since last visit: No Patient Has Alerts: No Implantable device outside of the clinic excluding No cellular tissue based products placed in the center since last visit: Pain Present Now: No Electronic Signature(s) Signed: 11/30/2022 3:13:44 PM By: [name] CHT EMT BS , , Entered By: [name] on 11/30/2022 15:13:44 -------------------------------------------------------------------------------- Encounter Discharge Information Details Patient Name: Date of Service: [name], [name] 11/30/2022 9:30 A M Medical Record Number: [id number] Patient Account Number: [id number] Date of Birth/Sex: Treating RN: Mar 11, 1971 (52 y.o. [name] Primary Care [name] Other Clinician: Haywood [name] Referring [name]: Treating [name]/Extender: [name] in Treatment: 6 Encounter Discharge Information Items Discharge Condition: Stable Ambulatory Status: Ambulatory Discharge Destination: Home Transportation: Private Auto Accompanied By: friend Schedule Follow-up Appointment: No Clinical Summary of Care: Electronic Signature(s) Signed: 11/30/2022 3:18:30 PM By: [name] CHT EMT BS , , Entered By: [name] on 11/30/2022 15:18:29 [name] ([id number]) [id number] Page 2 of 2 -------------------------------------------------------------------------------- Vitals Details Patient Name: Date of Service: [name], [name] 11/30/2022 9:30 A M Medical Record Number: [id number] Patient Account Number: [id number] Date of Birth/Sex: Treating RN: 04/10/1971 (52 y.o. [name] Primary Care [name] Other Clinician: Haywood [name] Referring [name]: Treating [name]/Extender: [name] in Treatment: 6 Vital Signs Time Taken: 09:36 Temperature (F): 98.1 Height (in): 65 Pulse (bpm): 78 Respiratory Rate (breaths/min): 16 Blood Pressure (mmHg): 111/80 Reference Range: 80 - 120 mg / dl Electronic Signature(s) Signed: 11/30/2022 3:14:04 PM By: [name] CHT EMT BS , , Entered By: [name] on 11/30/2022 15:14:04

## 2022-12-03 ENCOUNTER — Encounter (HOSPITAL_BASED_OUTPATIENT_CLINIC_OR_DEPARTMENT_OTHER): Payer: Medicare Other | Admitting: Internal Medicine

## 2022-12-03 DIAGNOSIS — T8131XD Disruption of external operation (surgical) wound, not elsewhere classified, subsequent encounter: Secondary | ICD-10-CM | POA: Diagnosis not present

## 2022-12-03 DIAGNOSIS — T86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: Secondary | ICD-10-CM

## 2022-12-03 DIAGNOSIS — H0270 Unspecified degenerative disorders of eyelid and periocular area: Secondary | ICD-10-CM

## 2022-12-03 NOTE — Progress Notes (Signed)
[name], [name] ([id number]) [id number] Page 1 of 1 Visit Report for 11/29/2022 SuperBill Details Patient Name: Date of Service: [name], [name] 11/29/2022 Medical Record Number: [id number] Patient Account Number: [id number] Date of Birth/Sex: Treating RN: 1970-08-16 (52 y.o. 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 Facility Procedures CPT4 Code Description Modifier Quantity [id number] G0277-(Facility Use Only) HBOT full body chamber, , 4 ICD-10 Diagnosis Description 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 Disruption of external operation (surgical) wound, not elsewhere classified, subsequent encounter 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

## 2022-12-03 NOTE — Progress Notes (Signed)
EZRA, MARQUESS ([id number]) [id number] Page 1 of 2 Visit Report for 12/03/2022 Problem List Details Patient Name: Date of Service: [name], [name] 12/03/2022 10:00 A M Medical Record Number: [id number] Patient Account Number: [id number] Date of Birth/Sex: Treating RN: 1971-01-07 (52 y.o. 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 Facility Procedures ITZABELLA, SORRELS ([id number]): CPT4 Code Description  [id number] G0277-(Facility Use Only) HBOT full body chamber, , ICD-10 Diagnosis Description 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 Disruption of external operation (surgical)  wound, not elsewhere clas 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 [id number] Page 2 of 2: Modifier Quantity 1 sified, subsequent encounter Physician Procedures : CPT4 Code Description Modifier [phone] - WC PHYS HYPERBARIC OXYGEN THERAPY ICD-10 Diagnosis Description 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

## 2022-12-03 NOTE — Progress Notes (Signed)
CHERIA, SADIQ ([id number]) [id number] Page 1 of 6 Visit Report for 12/03/2022 Chief Complaint Document Details Patient Name: Date of Service: [name], [name] 12/03/2022 12:30 PM Medical Record Number: [id number] Patient Account Number: [id number] Date of Birth/Sex: Treating RN: [birth year] (52 y.o. F) Primary Care Provider: Janece Canterbury Other Clinician: Referring Provider: Treating Provider/Extender: Luan Pulling in Treatment: 6 Information Obtained from: Patient Chief Complaint 10/18/2022; patient was referred here by plastic surgery for consideration of hyperbaric oxygen for flap preservation after surgery on 10/09/2022. Electronic Signature(s) Signed: 12/03/2022 4:55:26 PM By: Geralyn Corwin DO Entered By: Geralyn Corwin on 12/03/2022 13:32:08 -------------------------------------------------------------------------------- HPI Details Patient Name: Date of Service: [name], [place] 12/03/2022 12:30 PM Medical Record Number: [id number] Patient Account Number: [id number] Date of Birth/Sex: Treating RN: 1970/09/30 (52 y.o. F) Primary Care Provider: Janece Canterbury Other Clinician: Referring Provider: Treating Provider/Extender: Luan Pulling in Treatment: 6 History of Present Illness HPI Description: 10/18/2022 ADMISSION This is a 52 year old woman who had progressive problems with ptosis bilaterally. On 5/28 she underwent a trichophytic brow ptosis repair and upper blepharoplasty. The patient tells me that she actually did really well until about day 4. She then developed an area of necrosis on the left part of the flap. She is also developed pain along the incision line and ballotable fluid on the right edge of the surgical line/flap. She is taking Vicodin for the pain putting topical mupirocin on the incision line. She was on warm compresses as well. She was on Augmentin but I believe she is finished  this. She does not complain of any visual disturbances. No fever. She has been referred here by plastic surgery for consideration of hyperbaric oxygen for flap necrosis/failed flap Past medical history includes multiple sclerosis, hypothyroidism, cervical and lumbar spondylosis and hypertension 6/13; patient presents for follow-up. She started hyperbaric oxygen therapy on 6/11 and has tolerated it well. She has no issues or complaints today. She has a small necrotic wound on the left front side of her head from a failed flap. She has been placing antibiotic ointment to this area. She has no issues or complaints today. Area is smaller today. 6/27; patient presents for follow-up. She continues to do hyperbaric oxygen therapy and is tolerating this well. She has no issues or complaints today. She has been using antibiotic ointment to the necrotic wound and this is smaller with healthier tissue present. Overall she is improving nicely. 7/11; patient presents for follow-up. She has been doing hyperbaric oxygen therapy. She has completed 13 sessions. She did end up needing tubes in her ears as a result of barotrauma from hyperbaric oxygen therapy. However she has been doing well. I checked her ears prior to treatment today and the tympanic membrane was clear with tubes in place with no obvious irritation. Her wound on her forehead has healed. She has been using scar cream daily with SPF. 7/22; patient presents for follow-up. She has been doing hyperbaric oxygen therapy. She has completed 16 sessions. She took a break from hyperbaric oxygen therapy last week due to her brother's passing. We had to cancel hyperbaric oxygen therapy today due to patient experiencing tingling to her hands and feet in the chamber. She states she has not slept for the past few days. She has been using scar cream to the incision site. She has noted some drainage from the previous suture sites. Electronic Signature(s) Signed:  12/03/2022 4:55:26 PM By: Marshell Levan, [name] ([id number]) By:  Geralyn Corwin DO [id number] Page 2 of 6 Signed: 12/03/2022 4:55:26 PM Entered By: Geralyn Corwin on 12/03/2022 13:33:36 -------------------------------------------------------------------------------- Physical Exam Details Patient Name: Date of Service: [name], [name] 12/03/2022 12:30 PM Medical Record Number: [id number] Patient Account Number: [id number] Date of Birth/Sex: Treating RN: 1970-12-23 (52 y.o. F) Primary Care Provider: Janece Canterbury Other Clinician: Referring Provider: Treating Provider/Extender: Luan Pulling in Treatment: 6 Constitutional respirations regular, non-labored and within target range for patient.Marland Kitchen Psychiatric pleasant and cooperative. Notes Epithelization along the incision line at previous necrotic site on her forehead. No drainage noted. No fluctuance on palpation or indurated areas. Electronic Signature(s) Signed: 12/03/2022 4:55:26 PM By: Geralyn Corwin DO Entered By: Geralyn Corwin on 12/03/2022 13:34:26 -------------------------------------------------------------------------------- Physician Orders Details Patient Name: Date of Service: MERLINDA, WRUBEL 12/03/2022 12:30 PM Medical Record Number: [id number] Patient Account Number: [id number] Date of Birth/Sex: Treating RN: 11-23-1970 (52 y.o. [name] Primary Care Provider: Janece Canterbury Other Clinician: Referring Provider: Treating Provider/Extender: Luan Pulling in Treatment: 6 Verbal / Phone Orders: No Diagnosis Coding Follow-up Appointments ppointment in: - room 6 Dr. Mikey Bussing Return A Other: - Finish out hyperbarics. continue to apply sun screen and antibiotic ointment. Electronic Signature(s) Signed: 12/03/2022 4:55:26 PM By: Geralyn Corwin DO Entered By: Geralyn Corwin on 12/03/2022  13:34:32 -------------------------------------------------------------------------------- Problem List Details Patient Name: Date of Service: [name], Estell 12/03/2022 12:30 PM Medical Record Number: [id number] Patient Account Number: [id number] AUGUST, GOSSER ([id number]) [id number] Page 3 of 6 Date of Birth/Sex: Treating RN: 01-12-1971 (52 y.o. 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 F) Primary Care Provider: Janece Canterbury Other Clinician: Referring Provider: Treating Provider/Extender: Luan Pulling in Treatment: 6 Subjective Chief Complaint Information obtained from Patient 10/18/2022; patient was referred here by plastic surgery for consideration of hyperbaric oxygen for flap preservation after surgery on 10/09/2022. History of Present Illness (HPI) 10/18/2022 ADMISSION This is a 52 year old woman who had progressive problems with ptosis bilaterally. On 5/28 she underwent a trichophytic  brow ptosis repair and upper blepharoplasty. The patient tells me that she actually did really well until about day 4. She then developed an area of necrosis on the left part of the flap. She is also developed pain along the incision line and ballotable fluid on the right edge of the surgical line/flap. She is taking Vicodin for the pain putting topical mupirocin on the incision line. She was on warm compresses as well. She was on Augmentin but I believe she is finished this. She does not complain of any visual disturbances. No fever. She has been referred here by plastic surgery for consideration of hyperbaric oxygen for flap necrosis/failed flap Past medical history includes multiple sclerosis, hypothyroidism, cervical and lumbar spondylosis and hypertension 6/13; patient presents for follow-up. She started hyperbaric  oxygen therapy on 6/11 and has tolerated it well. She has no issues or complaints today. She has a small necrotic wound on the left front side of her head from a failed flap. She has been placing antibiotic ointment to this area. She has no issues or complaints today. Area is smaller today. 6/27; patient presents for follow-up. She continues to do hyperbaric oxygen therapy and is tolerating this well. She has no issues or complaints today. She has been using antibiotic ointment to the necrotic wound and this is smaller with healthier tissue present. Overall she is improving nicely. 7/11; patient presents for follow-up. She has been doing hyperbaric oxygen therapy. She has completed 13 sessions. She did end up needing tubes in her ears as a result of barotrauma from hyperbaric oxygen therapy. However she has been doing well. I checked her ears prior to treatment today and the tympanic membrane was clear with tubes in place with no obvious irritation. Her wound on her forehead has healed. She has been using scar cream daily with SPF. JARETZY, LHOMMEDIEU ([id number])  [id number] Page 4 of 6 7/22; patient presents for follow-up. She has been doing hyperbaric oxygen therapy. She has completed 16 sessions. She took a break from hyperbaric oxygen therapy last week due to her brother's passing. We had to cancel hyperbaric oxygen therapy today due to patient experiencing tingling to her hands and feet in the chamber. She states she has not slept for the past few days. She has been using scar cream to the incision site. She has noted some drainage from the previous suture sites. Patient History Family History No family history of Cancer, Diabetes, Heart Disease, Hereditary Spherocytosis, Hypertension, Kidney Disease, Lung Disease, Seizures, Stroke, Thyroid Problems, Tuberculosis. Social History Former smoker - quit - ended on 08/28/2022, Marital Status - Divorced, Alcohol Use - Never, Drug Use - No History. Medical History Respiratory Patient has history of Asthma Musculoskeletal Patient has history of Osteoarthritis Hospitalization/Surgery History - left knee surgery x2 2019 and 2019. - hernia repair. - cholecystectomy. - ORIF left radial. - carpel tunnel release. - rotator cuff repair. - 10/09/2022 bilateral upper eyelids ptosis, trichophytic brow ptosis repair and upper blepharophasty. Medical A Surgical History Notes nd Constitutional Symptoms (General Health) hypothyroidism Musculoskeletal MS fibromyalgia neuromuscular disorder Psychiatric Depression Objective Constitutional respirations regular, non-labored and within target range for patient.. Vitals Time Taken: 12:46 PM, Height: 65 in, Temperature: 97.8 F, Pulse: 51 bpm, Respiratory Rate: 18 breaths/min, Blood Pressure: 130/84 mmHg. Psychiatric pleasant and cooperative. General Notes: Epithelization along the incision line at previous necrotic site on her forehead. No drainage noted. No fluctuance on palpation or indurated areas. Integumentary (Hair, Skin) Wound #1R  status is Healed - Epithelialized. Original cause of wound was Surgical Injury. The date acquired was: 10/09/2022. The wound has been in treatment 6 weeks. The wound is located on the Left Forehead. The wound measures 0cm length x 0cm width x 0cm depth; 0cm^2 area and 0cm^3 volume. There is no tunneling or undermining noted. There is a none present amount of drainage noted. The wound margin is distinct with the outline attached to the wound base. There is no granulation within the wound bed. There is no necrotic tissue within the wound bed. The periwound skin appearance did not exhibit: Callus, Crepitus, Excoriation, Induration, Rash, Scarring, Dry/Scaly, Maceration, Atrophie Blanche, Cyanosis, Ecchymosis, Hemosiderin Staining, Mottled, Pallor, Rubor, Erythema. 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 I noted no areas that would be open to explain drainage. I did recommend going back to the antibiotic ointment. Continue sunscreen when going outside. Follow-up with surgeon for this issue. T me the wound continues to be healed. She has 4 more treatments left of o HBO and I recommended completing these. T oday She experienced tingling in her hands and feet in the chamber but stated this quickly resolved. HBO session was canceled. She denied any shortness of breath, visual changes or chest pain. She was alert and oriented the entire time I evaluated her when I went to the bedside at HBO. I did recommend that she eat well, hydrate and sleep. She has had a rough time since her brother passed 1 week ago. Symptoms seem to be more consistent with anxiety. Plan AUDIANNA, LANDGREN ([id number]) [id number] Page 5 of 6 Follow-up  Appointments: Return Appointment in: - room 6 Dr. Mikey Bussing Other: - Finish out hyperbarics. continue to apply sun screen and antibiotic ointment. 1. Finish remaining treatments of HBO 2. Follow-up with surgeon 3. Follow-up with wound care as needed. Electronic Signature(s) Signed: 12/03/2022 4:55:26 PM By: Geralyn Corwin DO Entered By: Geralyn Corwin on 12/03/2022 13:40:04 -------------------------------------------------------------------------------- HxROS Details Patient Name: Date of Service: [name], [name] 12/03/2022 12:30 PM Medical Record Number: [id number] Patient Account Number: [id number] Date of Birth/Sex: Treating RN: 1970/09/30 (52 y.o. F) Primary Care Provider: Janece Canterbury Other Clinician: Referring Provider: Treating Provider/Extender: Luan Pulling in Treatment: 6 Constitutional Symptoms (General Health) Medical History: Past Medical History Notes: hypothyroidism Respiratory Medical History: Positive for: Asthma Musculoskeletal Medical History: Positive for: Osteoarthritis Past Medical History Notes: MS fibromyalgia neuromuscular disorder Psychiatric Medical History: Past Medical History Notes: Depression Immunizations Pneumococcal Vaccine: Received Pneumococcal Vaccination: No Implantable Devices No devices added Hospitalization / Surgery History Type of Hospitalization/Surgery left knee surgery x2 2019 and 2019 hernia repair cholecystectomy ORIF left radial carpel tunnel release rotator cuff repair 10/09/2022 bilateral upper eyelids ptosis, trichophytic brow ptosis repair and upper blepharophasty ELISA, KUTNER ([id number]) [id number] Page 6 of 6 Family and Social History Cancer: No; Diabetes: No; Heart Disease: No; Hereditary Spherocytosis: No; Hypertension: No; Kidney Disease: No; Lung Disease: No; Seizures: No; Stroke: No; Thyroid Problems: No; Tuberculosis: No; Former smoker -  quit - ended on 08/28/2022; Marital Status - Divorced; Alcohol Use: Never; Drug Use: No History; Financial Concerns: No; Food, Clothing or Shelter Needs: No; Support System Lacking: No; Transportation Concerns: No Electronic Signature(s) Signed: 12/03/2022 4:55:26 PM By: Geralyn Corwin DO Entered By: Geralyn Corwin on 12/03/2022 13:33:41 -------------------------------------------------------------------------------- SuperBill Details Patient Name: Date of Service: [name], [name] 12/03/2022 Medical Record Number: [id number] Patient Account Number: [id number] Date of Birth/Sex: Treating RN: [birth year] (51 y.o. 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 Facility Procedures : CPT4 Code: 60109323 Description: 99213 - WOUND CARE VISIT-LEV 3 EST PT Modifier: Quantity: 1 Physician Procedures : CPT4 Code Description Modifier [id number] 99213 - WC PHYS LEVEL 3 - EST PT ICD-10 Diagnosis Description 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

## 2022-12-03 NOTE — Progress Notes (Addendum)
[name], [name] ([id number]) [id number] Page 1 of 2 Visit Report for 12/03/2022 HBO Details Patient Name: Date of Service: [name], [name] 12/03/2022 10:00 A M Medical Record Number: [id number] Patient Account Number: [id number] Date of Birth/Sex: Treating RN: [birth year] (52 y.o. Tommye Standard Primary Care Jaleigha Deane: Janece Canterbury Other Clinician: Karl Bales Referring Terek Bee: Treating Eleny Cortez/Extender: Luan Pulling in Treatment: 6 HBO Treatment Course Details Treatment Course Number: 1 Ordering Broox Lonigro: Geralyn Corwin T Treatments Ordered: otal 20 HBO Treatment Start Date: 10/23/2022 HBO Indication: Compromised/Failed Flap/Graft to Left Forehead HBO Treatment Details Treatment Number: 17 Patient Type: Outpatient Chamber Type: Monoplace Chamber Serial #: [id number] Treatment Protocol: 2.0 ATA with 90 minutes oxygen, with two 5 minute air breaks Treatment Details Compression Rate Down: 1.5 psi / minute De-Compression Rate Up: A breaks ir and breathing Compress Tx Pressure periods Decompress Decompress Begins Reached (leave Begins Ends unused spaces blank) Chamber Pressure (ATA 1 2 2222 -- 2 1 ) Clock Time (24 hr) 10:40 10:53 - - - - - - 11:13 11:24 Treatment Length: 44 (minutes) Treatment Segments: 1 Vital Signs Capillary Blood Glucose Reference Range: 80 - 120 mg / dl HBO Diabetic Blood Glucose Intervention Range: <131 mg/dl or >301 mg/dl Time Vitals Blood Respiratory Capillary Blood Glucose Pulse Action Type: Pulse: Temperature: Taken: Pressure: Rate: Glucose (mg/dl): Meter #: Oximetry (%) Taken: Pre 10:03 112/59 93 18 97.2 Post 11:21 115/78 55 18 97.2 Treatment Response Treatment Toleration: Fair Adverse Events: 1:Confinement Anxiety Treatment Completion Status: Treatment Aborted/Not Restarted Reason: Adverse Event Treatment Notes At 1003 the patient was asleep calling out "HELLO". I spoke to  her she woke up and become confused, asking for more air. The patient was asked to put on air mask, and she was able to do same. Dr. Mikey Bussing called to chamber side and spoke with the patient. Treatment was aborted. Decompression on AIR MASK. No complain of Shortness of breath, chest pain. She did state about some numbness in fingers and feet. The patient also stated that she had not sleep for some time. The patient was [facility] for a Dr. Visit with Dr. Mikey Bussing. Physician HBO Attestation: I certify that I supervised this HBO treatment in accordance with Medicare guidelines. A trained emergency response team is readily available per Yes hospital policies and procedures. Continue HBOT as ordered. Yes Electronic Signature(s) CONDA, WANNAMAKER ([id number]) [id number] Page 2 of 2 Signed: 12/04/2022 4:26:53 PM By: Geralyn Corwin DO Previous Signature: 12/03/2022 2:49:04 PM Version By: Karl Bales EMT Previous Signature: 12/03/2022 4:55:26 PM Version By: Geralyn Corwin DO Entered By: Geralyn Corwin on 12/04/2022 12:48:35 -------------------------------------------------------------------------------- HBO Safety Checklist Details Patient Name: Date of Service: [name], [name] 12/03/2022 10:00 A M Medical Record Number: [id number] Patient Account Number: [id number] Date of Birth/Sex: Treating RN: [birth year] (52 y.o. [name], [name] Primary Care Aliya Sol: Janece Canterbury Other Clinician: Karl Bales Referring Edrees Valent: Treating Liona Wengert/Extender: Luan Pulling in Treatment: 6 HBO Safety Checklist Items Safety Checklist Consent Form Signed Patient voided / foley secured and emptied When did you last eato yesterday Last dose of injectable or oral agent NA Ostomy pouch emptied and vented if applicable NA All implantable devices assessed, documented and approved NA Intravenous access site secured and  place NA Valuables secured Linens and cotton and cotton/polyester blend (less than 51% polyester) Personal oil-based products / skin lotions / body lotions removed Wigs or hairpieces removed Smoking or tobacco materials removed Books / newspapers / magazines /  loose paper removed Cologne, aftershave, perfume and deodorant removed Jewelry removed (may wrap wedding band) Make-up removed Hair care products removed Battery operated devices (external) removed Heating patches and chemical warmers removed Titanium eyewear removed NA Nail polish cured greater than 10 hours Over 10 hours old Casting material cured greater than 10 hours NA Hearing aids removed NA Loose dentures or partials removed NA Prosthetics have been removed NA Patient demonstrates correct use of air break device (if applicable) Patient concerns have been addressed Patient grounding bracelet on and cord attached to chamber Specifics for Inpatients (complete in addition to above) Medication sheet sent with patient NA Intravenous medications needed or due during therapy sent with patient NA Drainage tubes (e.g. nasogastric tube or chest tube secured and vented) NA Endotracheal or Tracheotomy tube secured NA Cuff deflated of air and inflated with saline NA Airway suctioned NA Notes The safety checklist was done before the treatment was started. Electronic Signature(s) Signed: 12/03/2022 2:39:27 PM By: Karl Bales EMT Entered By: Karl Bales on 12/03/2022 14:39:27

## 2022-12-03 NOTE — Progress Notes (Signed)
SHELI, DORIN ([id number]) [id number] Page 1 of 2 Visit Report for 12/03/2022 Arrival Information Details Patient Name: Date of Service: [name], [name] 12/03/2022 10:00 A M Medical Record Number: [id number] Patient Account Number: [id number] Date of Birth/Sex: Treating RN: 1971-04-10 (52 y.o. [name], [name] Primary Care Kline Bulthuis: Janece Canterbury Other Clinician: Karl Bales Referring Alfonse Garringer: Treating Simmie Camerer/Extender: Luan Pulling in Treatment: 6 Visit Information History Since Last Visit All ordered tests and consults were completed: Yes Patient Arrived: Ambulatory Added or deleted any medications: No Arrival Time: 09:34 Any new allergies or adverse reactions: No Accompanied By: Friend Had a fall or experienced change in No Transfer Assistance: None activities of daily living that may affect Patient Identification Verified: Yes risk of falls: Secondary Verification Process Completed: Yes Signs or symptoms of abuse/neglect since last visito No Patient Requires Transmission-Based Precautions: No Hospitalized since last visit: No Patient Has Alerts: No Implantable device outside of the clinic excluding No cellular tissue based products placed in the center since last visit: Pain Present Now: No Electronic Signature(s) Signed: 12/03/2022 2:38:13 PM By: Karl Bales EMT Entered By: Karl Bales on 12/03/2022 14:38:13 -------------------------------------------------------------------------------- Encounter Discharge Information Details Patient Name: Date of Service: [name], [name] 12/03/2022 10:00 A M Medical Record Number: [id number] Patient Account Number: [id number] Date of Birth/Sex: Treating RN: Oct 09, 1970 (52 y.o. Tommye Standard Primary Care Lundyn Coste: Janece Canterbury Other Clinician: Karl Bales Referring Jasiyah Paulding: Treating Jerzi Tigert/Extender: Luan Pulling in Treatment:  6 Encounter Discharge Information Items Discharge Condition: Stable Ambulatory Status: Ambulatory Discharge Destination: Other (Note Required) Accompanied By: Clearence Cheek Schedule Follow-up Appointment: Yes Clinical Summary of Care: Notes The patient [facility] for Dr. appointment. Electronic Signature(s) Signed: 12/03/2022 2:51:16 PM By: Karl Bales EMT Entered By: Karl Bales on 12/03/2022 14:51:16 [name] ([id number]) [id number] Page 2 of 2 -------------------------------------------------------------------------------- Vitals Details Patient Name: Date of Service: [name], [name] 12/03/2022 10:00 A M Medical Record Number: [id number] Patient Account Number: [id number] Date of Birth/Sex: Treating RN: [birth year] (52 y.o. Tommye Standard Primary Care Chandria Rookstool: Janece Canterbury Other Clinician: Karl Bales Referring Trystan Akhtar: Treating Shelbe Haglund/Extender: Luan Pulling in Treatment: 6 Vital Signs Time Taken: 10:03 Temperature (F): 97.2 Height (in): 65 Pulse (bpm): 93 Respiratory Rate (breaths/min): 18 Blood Pressure (mmHg): 112/59 Reference Range: 80 - 120 mg / dl Electronic Signature(s) Signed: 12/03/2022 2:38:34 PM By: Karl Bales EMT Entered By: Karl Bales on 12/03/2022 14:38:33

## 2022-12-04 ENCOUNTER — Encounter (HOSPITAL_BASED_OUTPATIENT_CLINIC_OR_DEPARTMENT_OTHER): Payer: Medicare Other | Admitting: Internal Medicine

## 2022-12-04 DIAGNOSIS — T86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: Secondary | ICD-10-CM

## 2022-12-04 DIAGNOSIS — T8131XD Disruption of external operation (surgical) wound, not elsewhere classified, subsequent encounter: Secondary | ICD-10-CM

## 2022-12-04 DIAGNOSIS — H0270 Unspecified degenerative disorders of eyelid and periocular area: Secondary | ICD-10-CM

## 2022-12-04 NOTE — Progress Notes (Signed)
[name], [name] ([id number]) [id number] Page 1 of 1 Visit Report for 12/04/2022 SuperBill Details Patient Name: Date of Service: [name], [name] 12/04/2022 Medical Record Number: [id number] Patient Account Number: [id number] Date of Birth/Sex: Treating RN: 10-17-1970 (52 y.o. 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 Facility Procedures CPT4 Code Description Modifier Quantity [id number] G0277-(Facility Use Only) HBOT full body chamber, , 4 ICD-10 Diagnosis Description 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 Disruption of external operation (surgical) wound, not elsewhere classified, subsequent encounter 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

## 2022-12-04 NOTE — Progress Notes (Signed)
[name], [name] ([id number]) [id number] Page 1 of 1 Visit Report for 11/30/2022 SuperBill Details Patient Name: Date of Service: [name], [name] 11/30/2022 Medical Record Number: [id number] Patient Account Number: [id number] Date of Birth/Sex: Treating RN: [birth year] (52 y.o. 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 Disruption of external operation (surgical) wound, not elsewhere classified, subsequent encounter 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

## 2022-12-04 NOTE — Progress Notes (Addendum)
[name], [name] ([id number]) [id number] Page 1 of 2 Visit Report for 12/04/2022 HBO Details Patient Name: Date of Service: [name], [name] 12/04/2022 7:30 A M Medical Record Number: [id number] Patient Account Number: [id number] Date of Birth/Sex: Treating RN: 28-Oct-1970 (52 y.o. [name] Primary Care Parvin Stetzer: Janece Canterbury Other Clinician: Haywood Pao Referring Madora Barletta: Treating Sheldon Amara/Extender: Luan Pulling in Treatment: 6 HBO Treatment Course Details Treatment Course Number: 1 Ordering Mollyann Halbert: Geralyn Corwin T Treatments Ordered: otal 20 HBO Treatment Start Date: 10/23/2022 HBO Indication: Compromised/Failed Flap/Graft to Left Forehead HBO Treatment Details Treatment Number: 18 Patient Type: Outpatient Chamber Type: Monoplace Chamber Serial #: [id number] Treatment Protocol: 2.0 ATA with 90 minutes oxygen, with two 5 minute air breaks Treatment Details Compression Rate Down: 1.0 psi / minute De-Compression Rate Up: 1.5 psi / minute A breaks and breathing ir Compress Tx Pressure periods Decompress Decompress Begins Reached (leave unused spaces Begins Ends blank) Chamber Pressure (ATA 1 2 2 2 2 2  --2 1 ) Clock Time (24 hr) 08:06 08:21 08:51 08:56 09:26 09:31 - - 10:01 10:11 Treatment Length: 125 (minutes) Treatment Segments: 4 Vital Signs Capillary Blood Glucose Reference Range: 80 - 120 mg / dl HBO Diabetic Blood Glucose Intervention Range: <131 mg/dl or >425 mg/dl Type: Time Vitals Blood Respiratory Capillary Blood Glucose Pulse Action Pulse: Temperature: Taken: Pressure: Rate: Glucose (mg/dl): Meter #: Oximetry (%) Taken: Pre 07:53 111/79 75 18 98.3 none per protocol Post 10:14 122/79 43 16 97.8 none per protocol Treatment Response Treatment Toleration: Well Treatment Completion Status: Treatment Completed without Adverse Event Treatment Notes Mrs. Sainato arrived with normal vital  signs. She prepared for treatment. After performing a safety check, she was placed in the chamber which was compressed with 100% oxygen at a rate of 1.5 psi/min after confirming normal ear equalization. She tolerated treatment and subsequent decompression at 1.5 psi/min. Her post-treatment heart rate was 43 bpm. She was asymptomatic for bradycardia. She was stable upon discharge with her friend. Physician HBO Attestation: I certify that I supervised this HBO treatment in accordance with Medicare guidelines. A trained emergency response team is readily available per Yes hospital policies and procedures. Continue HBOT as ordered. Yes Electronic Signature(s) Signed: 12/04/2022 4:26:53 PM By: Geralyn Corwin DO Previous Signature: 12/04/2022 12:08:43 PM Version By: Haywood Pao CHT EMT BS , , Previous Signature: 12/04/2022 10:10:50 AM Version By: Haywood Pao CHT EMT BS , , Entered By: Geralyn Corwin on 12/04/2022 16:22:39 [name] ([id number]) [id number] Page 2 of 2 -------------------------------------------------------------------------------- HBO Safety Checklist Details Patient Name: Date of Service: [name], [name] 12/04/2022 7:30 A M Medical Record Number: [id number] Patient Account Number: [id number] Date of Birth/Sex: Treating RN: [birth year] (52 y.o. [name] Primary Care Quinterious Walraven: Janece Canterbury Other Clinician: Haywood Pao Referring Camrin Gearheart: Treating Anthonee Gelin/Extender: Luan Pulling in Treatment: 6 HBO Safety Checklist Items Safety Checklist Consent Form Signed Patient voided / foley secured and emptied When did you last eato Breakfast Last dose of injectable or oral agent n/a Ostomy pouch emptied and vented if applicable NA All implantable devices assessed, documented and approved NA Intravenous access site secured and place NA Valuables secured Linens and cotton and cotton/polyester  blend (less than 51% polyester) Personal oil-based products / skin lotions / body lotions removed Wigs or hairpieces removed NA Smoking or tobacco materials removed NA Books / newspapers / magazines / loose paper removed Cologne, aftershave, perfume and deodorant removed Jewelry removed (may wrap wedding band) Make-up removed  Hair care products removed Battery operated devices (external) removed Heating patches and chemical warmers removed Titanium eyewear removed Nail polish cured greater than 10 hours greater than 10 hours Casting material cured greater than 10 hours NA Hearing aids removed NA Loose dentures or partials removed NA Prosthetics have been removed NA Patient demonstrates correct use of air break device (if applicable) Patient concerns have been addressed Patient grounding bracelet on and cord attached to chamber Specifics for Inpatients (complete in addition to above) Medication sheet sent with patient NA Intravenous medications needed or due during therapy sent with patient NA Drainage tubes (e.g. nasogastric tube or chest tube secured and vented) NA Endotracheal or Tracheotomy tube secured NA Cuff deflated of air and inflated with saline NA Airway suctioned NA Notes Paper version used prior to treatment start. Electronic Signature(s) Signed: 12/04/2022 9:12:46 AM By: Haywood Pao CHT EMT BS , , Entered By: Haywood Pao on 12/04/2022 09:12:46

## 2022-12-04 NOTE — Progress Notes (Signed)
HENLI, HEY ([id number]) [id number] Page 1 of 1 Visit Report for 12/04/2022 Arrival Information Details Patient Name: Date of Service: MARKEETA, SCALF 12/04/2022 7:30 A M Medical Record Number: [id number] Patient Account Number: [id number] Date of Birth/Sex: Treating RN: 1970/12/28 (52 y.o. [name] Primary Care Korbin Mapps: Janece Canterbury Other Clinician: Haywood Pao Referring Nazarene Bunning: Treating Laneice Meneely/Extender: Luan Pulling in Treatment: 6 Visit Information History Since Last Visit All ordered tests and consults were completed: Yes Patient Arrived: Ambulatory Added or deleted any medications: No Arrival Time: 07:16 Any new allergies or adverse reactions: No Accompanied By: friend Had a fall or experienced change in No Transfer Assistance: None activities of daily living that may affect Patient Identification Verified: Yes risk of falls: Secondary Verification Process Completed: Yes Signs or symptoms of abuse/neglect since last visito No Patient Requires Transmission-Based Precautions: No Hospitalized since last visit: No Patient Has Alerts: No Implantable device outside of the clinic excluding No cellular tissue based products placed in the center since last visit: Pain Present Now: No Electronic Signature(s) Signed: 12/04/2022 9:11:15 AM By: Haywood Pao CHT EMT BS , , Entered By: Haywood Pao on 12/04/2022 09:11:15 -------------------------------------------------------------------------------- Vitals Details Patient Name: Date of Service: [name], Kailan 12/04/2022 7:30 A M Medical Record Number: [id number] Patient Account Number: [id number] Date of Birth/Sex: Treating RN: 07-11-1970 (52 y.o. [name] Primary Care Odel Schmid: Janece Canterbury Other Clinician: Haywood Pao Referring Mazzie Brodrick: Treating Asim Gersten/Extender: Luan Pulling in  Treatment: 6 Vital Signs Time Taken: 07:53 Temperature (F): 98.3 Height (in): 65 Pulse (bpm): 75 Respiratory Rate (breaths/min): 18 Blood Pressure (mmHg): 111/79 Reference Range: 80 - 120 mg / dl Electronic Signature(s) Signed: 12/04/2022 9:11:49 AM By: Haywood Pao CHT EMT BS , , Entered By: Haywood Pao on 12/04/2022 09:11:49

## 2022-12-05 ENCOUNTER — Encounter (HOSPITAL_BASED_OUTPATIENT_CLINIC_OR_DEPARTMENT_OTHER): Payer: Medicare Other | Admitting: Internal Medicine

## 2022-12-05 DIAGNOSIS — T86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: Secondary | ICD-10-CM | POA: Diagnosis not present

## 2022-12-05 NOTE — Progress Notes (Signed)
ELLENORA, TALTON ([id number]) [id number] Page 1 of 1 Visit Report for 12/05/2022 SuperBill Details Patient Name: Date of Service: DENNISHA, MOUSER 12/05/2022 Medical Record Number: [id number] Patient Account Number: [id number] Date of Birth/Sex: Treating RN: 01-09-1971 (53 y.o. 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 Facility Procedures CPT4 Code Description Modifier Quantity [id number] G0277-(Facility Use Only) HBOT full body chamber, , 4 ICD-10 Diagnosis Description 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 Disruption of external operation (surgical) wound, not elsewhere classified, subsequent encounter 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

## 2022-12-05 NOTE — Progress Notes (Addendum)
[name], [name] ([id number]) [id number] Page 1 of 2 Visit Report for 12/05/2022 HBO Details Patient Name: Date of Service: [name], [name] 12/05/2022 7:30 A M Medical Record Number: [id number] Patient Account Number: [id number] Date of Birth/Sex: Treating RN: 1971-03-04 (52 y.o. [name], [name] Primary Care Keyleen Cerrato: Janece Canterbury Other Clinician: Haywood Pao Referring Aiden Rao: Treating Rosamae Rocque/Extender: Orpah Melter in Treatment: 6 HBO Treatment Course Details Treatment Course Number: 1 Ordering Adrinne Sze: Geralyn Corwin T Treatments Ordered: otal 20 HBO Treatment Start Date: 10/23/2022 HBO Indication: Compromised/Failed Flap/Graft to Left Forehead HBO Treatment Details Treatment Number: 19 Patient Type: Outpatient Chamber Type: Monoplace Chamber Serial #: [id number] Treatment Protocol: 2.0 ATA with 90 minutes oxygen, with two 5 minute air breaks Treatment Details Compression Rate Down: 1.5 psi / minute De-Compression Rate Up: 2.0 psi / minute A breaks and breathing ir Compress Tx Pressure periods Decompress Decompress Begins Reached (leave unused spaces Begins Ends blank) Chamber Pressure (ATA 1 2 2 2 2 2  --2 1 ) Clock Time (24 hr) 08:14 08:24 08:54 08:59 09:30 09:35 - - 10:05 10:13 Treatment Length: 119 (minutes) Treatment Segments: 4 Vital Signs Capillary Blood Glucose Reference Range: 80 - 120 mg / dl HBO Diabetic Blood Glucose Intervention Range: <131 mg/dl or >440 mg/dl Type: Time Vitals Blood Respiratory Capillary Blood Glucose Pulse Action Pulse: Temperature: Taken: Pressure: Rate: Glucose (mg/dl): Meter #: Oximetry (%) Taken: Pre 07:48 110/65 76 18 98.4 none per protocol Post 10:17 132/84 60 18 98.4 Manual pulse 60 Treatment Response Treatment Toleration: Well Treatment Completion Status: Treatment Completed without Adverse Event Treatment Notes [name] arrived with normal vital signs. She  prepared for treatment. After performing a safety check, she was placed in the chamber which was compressed with 100% oxygen at a rate of 1.5 psi/min after confirming normal ear equalization. She tolerated treatment and subsequent decompression at approximately 2 psi/min. Her post-treatment heart rate was manual 60 bpm. She was stable upon discharge with her friend. Lyall Faciane Notes No concerns about treatment given Physician HBO Attestation: I certify that I supervised this HBO treatment in accordance with Medicare guidelines. A trained emergency response team is readily available per Yes hospital policies and procedures. Continue HBOT as ordered. Yes Electronic Signature(s) Signed: 12/05/2022 4:12:50 PM By: Baltazar Najjar MD Previous Signature: 12/05/2022 1:10:17 PM Version By: Haywood Pao CHT EMT BS , , Entered By: Baltazar Najjar on 12/05/2022 16:09:54 [name] ([id number]) [id number] Page 2 of 2 -------------------------------------------------------------------------------- HBO Safety Checklist Details Patient Name: Date of Service: [name], [name] 12/05/2022 7:30 A M Medical Record Number: [id number] Patient Account Number: [id number] Date of Birth/Sex: Treating RN: Aug 06, 1970 (52 y.o. [name], [name] Primary Care [name]: Janece Canterbury Other Clinician: Haywood Pao Referring Darriana Deboy: Treating Abdurahman Rugg/Extender: Orpah Melter in Treatment: 6 HBO Safety Checklist Items Safety Checklist Consent Form Signed Patient voided / foley secured and emptied When did you last eato Breakfast Last dose of injectable or oral agent n/a Ostomy pouch emptied and vented if applicable NA All implantable devices assessed, documented and approved NA Intravenous access site secured and place NA Valuables secured Linens and cotton and cotton/polyester blend (less than 51% polyester) Personal oil-based products / skin lotions  / body lotions removed Wigs or hairpieces removed NA Smoking or tobacco materials removed NA Books / newspapers / magazines / loose paper removed Cologne, aftershave, perfume and deodorant removed Jewelry removed (may wrap wedding band) Make-up removed Hair care products removed Battery operated devices (external) removed Heating  patches and chemical warmers removed Titanium eyewear removed Nail polish cured greater than 10 hours greater than 10 hours Casting material cured greater than 10 hours NA Hearing aids removed NA Loose dentures or partials removed NA Prosthetics have been removed NA Patient demonstrates correct use of air break device (if applicable) Patient concerns have been addressed Patient grounding bracelet on and cord attached to chamber Specifics for Inpatients (complete in addition to above) Medication sheet sent with patient NA Intravenous medications needed or due during therapy sent with patient NA Drainage tubes (e.g. nasogastric tube or chest tube secured and vented) NA Endotracheal or Tracheotomy tube secured NA Cuff deflated of air and inflated with saline NA Airway suctioned NA Notes Paper version used prior to treatment start. Electronic Signature(s) Signed: 12/05/2022 1:06:22 PM By: Haywood Pao CHT EMT BS , , Entered By: Haywood Pao on 12/05/2022 13:06:22

## 2022-12-05 NOTE — Progress Notes (Signed)
[name] ([id number]) [id number] Page 1 of 8 Visit Report for 12/03/2022 Arrival Information Details Patient Name: Date of Service: [name], [name] 12/03/2022 12:30 PM Medical Record Number: [id number] Patient Account Number: [id number] Date of Birth/Sex: Treating RN: 1970-07-22 (52 y.o. [name] Primary Care Billye Nydam: Janece Canterbury Other Clinician: Referring Shenicka Sunderlin: Treating Viral Schramm/Extender: Luan Pulling in Treatment: 6 Visit Information History Since Last Visit All ordered tests and consults were completed: Yes Patient Arrived: Ambulatory Added or deleted any medications: No Arrival Time: 12:40 Any new allergies or adverse reactions: No Accompanied By: father Had a fall or experienced change in No Transfer Assistance: None activities of daily living that may affect Patient Identification Verified: Yes risk of falls: Secondary Verification Process Completed: Yes Signs or symptoms of abuse/neglect since last visito No Patient Requires Transmission-Based Precautions: No Hospitalized since last visit: No Patient Has Alerts: No Implantable device outside of the clinic excluding No cellular tissue based products placed in the center since last visit: Has Dressing in Place as Prescribed: Yes Pain Present Now: Yes Electronic Signature(s) Signed: 12/05/2022 11:11:00 AM By: Brenton Grills Entered By: Brenton Grills on 12/03/2022 12:46:18 -------------------------------------------------------------------------------- Clinic Level of Care Assessment Details Patient Name: Date of Service: [name], [name] 12/03/2022 12:30 PM Medical Record Number: [id number] Patient Account Number: [id number] Date of Birth/Sex: Treating RN: [birth year] (52 y.o. [name] Primary Care Delitha Elms: Janece Canterbury Other Clinician: Referring Taletha Twiford: Treating Kaitlin Alcindor/Extender: Luan Pulling in  Treatment: 6 Clinic Level of Care Assessment Items TOOL 4 Quantity Score X- 1 0 Use when only an EandM is performed on FOLLOW-UP visit ASSESSMENTS - Nursing Assessment / Reassessment X- 1 10 Reassessment of Co-morbidities (includes updates in patient status) X- 1 5 Reassessment of Adherence to Treatment Plan ASSESSMENTS - Wound and Skin A ssessment / Reassessment X - Simple Wound Assessment / Reassessment - one wound 1 5 []  - 0 Complex Wound Assessment / Reassessment - multiple wounds X- 1 10 Dermatologic / Skin Assessment (not related to wound area) ASSESSMENTS - Focused Assessment []  - 0 Circumferential Edema Measurements - multi extremities []  - 0 Nutritional Assessment / Counseling / Intervention [name] ([id number]) [id number] Page 2 of 8 []  - 0 Lower Extremity Assessment (monofilament, tuning fork, pulses) []  - 0 Peripheral Arterial Disease Assessment (using hand held doppler) ASSESSMENTS - Ostomy and/or Continence Assessment and Care []  - 0 Incontinence Assessment and Management []  - 0 Ostomy Care Assessment and Management (repouching, etc.) PROCESS - Coordination of Care X - Simple Patient / Family Education for ongoing care 1 15 []  - 0 Complex (extensive) Patient / Family Education for ongoing care X- 1 10 Staff obtains Chiropractor, Records, T Results / Process Orders est []  - 0 Staff telephones HHA, Nursing Homes / Clarify orders / etc []  - 0 Routine Transfer to another Facility (non-emergent condition) []  - 0 Routine Hospital Admission (non-emergent condition) []  - 0 New Admissions / Manufacturing engineer / Ordering NPWT Apligraf, etc. , []  - 0 Emergency Hospital Admission (emergent condition) X- 1 10 Simple Discharge Coordination []  - 0 Complex (extensive) Discharge Coordination PROCESS - Special Needs []  - 0 Pediatric / Minor Patient Management []  - 0 Isolation Patient Management []  - 0 Hearing / Language /  Visual special needs []  - 0 Assessment of Community assistance (transportation, D/C planning, etc.) []  - 0 Additional assistance / Altered mentation []  - 0 Support Surface(s) Assessment (bed, cushion, seat, etc.) INTERVENTIONS - Wound Cleansing /  Measurement X - Simple Wound Cleansing - one wound 1 5 []  - 0 Complex Wound Cleansing - multiple wounds X- 1 5 Wound Imaging (photographs - any number of wounds) []  - 0 Wound Tracing (instead of photographs) X- 1 5 Simple Wound Measurement - one wound []  - 0 Complex Wound Measurement - multiple wounds INTERVENTIONS - Wound Dressings X - Small Wound Dressing one or multiple wounds 1 10 []  - 0 Medium Wound Dressing one or multiple wounds []  - 0 Large Wound Dressing one or multiple wounds X- 1 5 Application of Medications - topical []  - 0 Application of Medications - injection INTERVENTIONS - Miscellaneous []  - 0 External ear exam []  - 0 Specimen Collection (cultures, biopsies, blood, body fluids, etc.) []  - 0 Specimen(s) / Culture(s) sent or taken to Lab for analysis []  - 0 Patient Transfer (multiple staff / Nurse, adult / Similar devices) []  - 0 Simple Staple / Suture removal (25 or less) []  - 0 Complex Staple / Suture removal (26 or more) []  - 0 Hypo / Hyperglycemic Management (close monitor of Blood Glucose) [name] ([id number]) [id number] Page 3 of 8 []  - 0 Ankle / Brachial Index (ABI) - do not check if billed separately []  - 0 Vital Signs Has the patient been seen at the hospital within the last three years: Yes Total Score: 95 Level Of Care: New/Established - Level 3 Electronic Signature(s) Signed: 12/05/2022 11:11:00 AM By: Brenton Grills Entered By: Brenton Grills on 12/03/2022 13:01:15 -------------------------------------------------------------------------------- Encounter Discharge Information Details Patient Name: Date of Service: [name], [name] 12/03/2022 12:30  PM Medical Record Number: [id number] Patient Account Number: [id number] Date of Birth/Sex: Treating RN: 1971-02-25 (52 y.o. [name] Primary Care Gayanne Prescott: Janece Canterbury Other Clinician: Referring Mekiah Wahler: Treating Shaul Trautman/Extender: Luan Pulling in Treatment: 6 Encounter Discharge Information Items Discharge Condition: Stable Ambulatory Status: Ambulatory Discharge Destination: Home Transportation: Private Auto Accompanied By: caregiver Schedule Follow-up Appointment: Yes Clinical Summary of Care: Patient Declined Electronic Signature(s) Signed: 12/05/2022 11:11:00 AM By: Brenton Grills Entered By: Brenton Grills on 12/03/2022 13:10:14 -------------------------------------------------------------------------------- Lower Extremity Assessment Details Patient Name: Date of Service: [name], [name] 12/03/2022 12:30 PM Medical Record Number: [id number] Patient Account Number: [id number] Date of Birth/Sex: Treating RN: 1970/08/23 (52 y.o. [name] Primary Care Ainslee Sou: Janece Canterbury Other Clinician: Referring Jencarlos Nicolson: Treating Zyon Grout/Extender: Luan Pulling in Treatment: 6 Electronic Signature(s) Signed: 12/05/2022 11:11:00 AM By: Brenton Grills Entered By: Brenton Grills on 12/03/2022 12:47:19 -------------------------------------------------------------------------------- Multi Wound Chart Details Patient Name: Date of Service: [name], [name] 12/03/2022 12:30 PM [name] ([id number]) [id number] Page 4 of 8 Medical Record Number: [id number] Patient Account Number: [id number] Date of Birth/Sex: Treating RN: 17-Jun-1970 (52 y.o. F) Primary Care Taylinn Brabant: Janece Canterbury Other Clinician: Referring Jocob Dambach: Treating Jerrye Seebeck/Extender: Luan Pulling in Treatment: 6 Vital Signs Height(in): 65 Pulse(bpm): 51 Weight(lbs): Blood Pressure(mmHg):  130/84 Body Mass Index(BMI): Temperature(F): 97.8 Respiratory Rate(breaths/min): 18 [1R:Photos:] [N/A:N/A] Left Forehead N/A N/A Wound Location: Surgical Injury N/A N/A Wounding Event: Compromised Skin Graft/Flap N/A N/A Primary Etiology: Asthma, Osteoarthritis N/A N/A Comorbid History: 10/09/2022 N/A N/A Date Acquired: 6 N/A N/A Weeks of Treatment: Healed - Epithelialized N/A N/A Wound Status: Yes N/A N/A Wound Recurrence: Yes N/A N/A Clustered Wound: 0x0x0 N/A N/A Measurements L x W x D (cm) 0 N/A N/A A (cm) : rea 0 N/A N/A Volume (cm) : 100.00% N/A N/A % Reduction in A rea: 100.00% N/A N/A %  Reduction in Volume: Full Thickness Without Exposed N/A N/A Classification: Support Structures None Present N/A N/A Exudate Amount: Distinct, outline attached N/A N/A Wound Margin: None Present (0%) N/A N/A Granulation Amount: None Present (0%) N/A N/A Necrotic Amount: Fascia: No N/A N/A Exposed Structures: Fat Layer (Subcutaneous Tissue): No Tendon: No Muscle: No Joint: No Bone: No Large (67-100%) N/A N/A Epithelialization: Excoriation: No N/A N/A Periwound Skin Texture: Induration: No Callus: No Crepitus: No Rash: No Scarring: No Maceration: No N/A N/A Periwound Skin Moisture: Dry/Scaly: No Atrophie Blanche: No N/A N/A Periwound Skin Color: Cyanosis: No Ecchymosis: No Erythema: No Hemosiderin Staining: No Mottled: No Pallor: No Rubor: No Treatment Notes Wound #1R (Forehead) Wound Laterality: Left Cleanser Peri-Wound Care Topical Primary Dressing Secondary Dressing LIESEL, PECKENPAUGH ([id number]) [id number] Page 5 of 8 Secured With Compression Wrap Compression Stockings Add-Ons Electronic Signature(s) Signed: 12/03/2022 4:55:26 PM By: Geralyn Corwin DO Entered By: Geralyn Corwin on 12/03/2022 13:32:02 -------------------------------------------------------------------------------- Multi-Disciplinary Care Plan  Details Patient Name: Date of Service: [name], [name] 12/03/2022 12:30 PM Medical Record Number: [id number] Patient Account Number: [id number] Date of Birth/Sex: Treating RN: 04-14-1971 (52 y.o. [name] Primary Care Kristeena Meineke: Janece Canterbury Other Clinician: Referring Chavie Kolinski: Treating Koi Zangara/Extender: Luan Pulling in Treatment: 6 Active Inactive HBO Nursing Diagnoses: Potential for oxygen toxicity seizures related to delivery of 100% oxygen at an increased atmospheric pressure Goals: Patient and/or family will be able to state/discuss factors appropriate to the management of their disease process during treatment Date Initiated: 10/18/2022 Target Resolution Date: 01/12/2023 Goal Status: Active Interventions: Assess for signs and symptoms related to adverse events, including but not limited to confinement anxiety, pneumothorax, oxygen toxicity and baurotrauma Notes: 11/22/2022 Wound is healed. Finish out HBO. Electronic Signature(s) Signed: 12/05/2022 11:11:00 AM By: Brenton Grills Entered By: Brenton Grills on 12/03/2022 12:56:11 -------------------------------------------------------------------------------- Pain Assessment Details Patient Name: Date of Service: [name], [name] 12/03/2022 12:30 PM Medical Record Number: [id number] Patient Account Number: [id number] Date of Birth/Sex: Treating RN: November 07, 1970 (52 y.o. [name] Primary Care Rakin Lemelle: Janece Canterbury Other Clinician: Referring Genee Rann: Treating Ferol Laiche/Extender: Luan Pulling in Treatment: 6 Active Problems Location of Pain Severity and Description of Pain Patient Has Delila Pereyra Site Locations [place], [place] ([id number]) [id number] Page 6 of 8 Site Locations Pain Location: Pain in Ulcers Pain Management and Medication Current Pain Management: Electronic Signature(s) Signed: 12/05/2022 11:11:00 AM By: Brenton Grills Entered By: Brenton Grills on 12/03/2022 12:47:09 -------------------------------------------------------------------------------- Patient/Caregiver Education Details Patient Name: Date of Service: [name] 7/22/2024andnbsp12:30 PM Medical Record Number: [id number] Patient Account Number: [id number] Date of Birth/Gender: Treating RN: [birth year] (52 y.o. [name] Primary Care Physician: Janece Canterbury Other Clinician: Referring Physician: Treating Physician/Extender: Luan Pulling in Treatment: 6 Education Assessment Education Provided To: Patient and Caregiver Education Topics Provided Wound/Skin Impairment: Methods: Explain/Verbal Responses: State content correctly Electronic Signature(s) Signed: 12/05/2022 11:11:00 AM By: Brenton Grills Entered By: Brenton Grills on 12/03/2022 12:56:28 -------------------------------------------------------------------------------- Wound Assessment Details Patient Name: Date of Service: [name], [name] 12/03/2022 12:30 PM Medical Record Number: [id number] Patient Account Number: [id number] Date of Birth/Sex: Treating RN: 1970-11-14 (52 y.o. [name] Primary Care Kit Brubacher: Janece Canterbury Other Clinician: [name] ([id number]) [id number] Page 7 of 8 Referring Sheza Strickland: Treating Ellenie Salome/Extender: Luan Pulling in Treatment: 6 Wound Status Wound Number: 1R Primary Etiology: Compromised Skin Graft/Flap Wound Location: Left Forehead Wound Status: Healed - Epithelialized Wounding Event: Surgical Injury Comorbid  History: Asthma, Osteoarthritis Date Acquired: 10/09/2022 Weeks Of Treatment: 6 Clustered Wound: Yes Photos Wound Measurements Length: (cm) Width: (cm) Depth: (cm) Area: (cm) Volume: (cm) 0 % Reduction in Area: 100% 0 % Reduction in Volume: 100% 0 Epithelialization: Large (67-100%) 0 Tunneling: No 0 Undermining:  No Wound Description Classification: Full Thickness Without Exposed Support Wound Margin: Distinct, outline attached Exudate Amount: None Present Structures Foul Odor After Cleansing: No Slough/Fibrino No Wound Bed Granulation Amount: None Present (0%) Exposed Structure Necrotic Amount: None Present (0%) Fascia Exposed: No Fat Layer (Subcutaneous Tissue) Exposed: No Tendon Exposed: No Muscle Exposed: No Joint Exposed: No Bone Exposed: No Periwound Skin Texture Texture Color No Abnormalities Noted: No No Abnormalities Noted: No Callus: No Atrophie Blanche: No Crepitus: No Cyanosis: No Excoriation: No Ecchymosis: No Induration: No Erythema: No Rash: No Hemosiderin Staining: No Scarring: No Mottled: No Pallor: No Moisture Rubor: No No Abnormalities Noted: No Dry / Scaly: No Maceration: No Treatment Notes Wound #1R (Forehead) Wound Laterality: Left Cleanser Peri-Wound Care Topical Primary Dressing Secondary Dressing Secured With [place] ([id number]) [id number] Page 8 of 8 Compression Wrap Compression Stockings Add-Ons Electronic Signature(s) Signed: 12/05/2022 11:11:00 AM By: Brenton Grills Entered By: Brenton Grills on 12/03/2022 13:09:16 -------------------------------------------------------------------------------- Vitals Details Patient Name: Date of Service: [name], [name] 12/03/2022 12:30 PM Medical Record Number: [id number] Patient Account Number: [id number] Date of Birth/Sex: Treating RN: Oct 31, 1970 (52 y.o. [name] Primary Care Allecia Bells: Janece Canterbury Other Clinician: Referring Endi Lagman: Treating Keller Mikels/Extender: Luan Pulling in Treatment: 6 Vital Signs Time Taken: 12:46 Temperature (F): 97.8 Height (in): 65 Pulse (bpm): 51 Respiratory Rate (breaths/min): 18 Blood Pressure (mmHg): 130/84 Reference Range: 80 - 120 mg / dl Electronic Signature(s) Signed: 12/05/2022  11:11:00 AM By: Brenton Grills Entered By: Brenton Grills on 12/03/2022 12:46:54

## 2022-12-05 NOTE — Progress Notes (Signed)
[name], [name] ([id number]) [id number] Page 1 of 2 Visit Report for 12/05/2022 Arrival Information Details Patient Name: Date of Service: [name], [name] 12/05/2022 7:30 A M Medical Record Number: [id number] Patient Account Number: [id number] Date of Birth/Sex: Treating RN: 13-Apr-1971 (52 y.o. [name], [name] Primary Care [name] Other Clinician: Haywood [name] Referring [name]: Treating [name]/Extender: [name] in Treatment: 6 Visit Information History Since Last Visit All ordered tests and consults were completed: Yes Patient Arrived: Ambulatory Added or deleted any medications: No Arrival Time: 07:15 Any new allergies or adverse reactions: No Accompanied By: friend Had a fall or experienced change in No Transfer Assistance: None activities of daily living that may affect Patient Identification Verified: Yes risk of falls: Secondary Verification Process Completed: Yes Signs or symptoms of abuse/neglect since last visito No Patient Requires Transmission-Based Precautions: No Hospitalized since last visit: No Patient Has Alerts: No Implantable device outside of the clinic excluding No cellular tissue based products placed in the center since last visit: Pain Present Now: No Electronic Signature(s) Signed: 12/05/2022 1:03:49 PM By: [name] CHT EMT BS , , Entered By: [name] on 12/05/2022 13:03:49 -------------------------------------------------------------------------------- Encounter Discharge Information Details Patient Name: Date of Service: [name] 12/05/2022 7:30 A M Medical Record Number: [id number] Patient Account Number: [id number] Date of Birth/Sex: Treating RN: 1970/10/19 (52 y.o. [name] Primary Care [name] Other Clinician: Haywood [name] Referring [name]: Treating [name]/Extender: [name] in Treatment: 6 Encounter Discharge Information Items Discharge Condition: Stable Ambulatory Status: Ambulatory Discharge Destination: Home Transportation: Private Auto Accompanied By: friend Schedule Follow-up Appointment: No Clinical Summary of Care: Electronic Signature(s) Signed: 12/05/2022 1:11:13 PM By: [name] CHT EMT BS , , Entered By: [name] on 12/05/2022 13:11:13 [name] ([id number]) [id number] Page 2 of 2 -------------------------------------------------------------------------------- Vitals Details Patient Name: Date of Service: [name], [name] 12/05/2022 7:30 A M Medical Record Number: [id number] Patient Account Number: [id number] Date of Birth/Sex: Treating RN: [name] (52 y.o. [name], [name] Primary Care [name] Other Clinician: Haywood [name] Referring [name]: Treating [name]/Extender: [name] in Treatment: 6 Vital Signs Time Taken: 07:48 Temperature (F): 98.4 Height (in): 65 Pulse (bpm): 76 Respiratory Rate (breaths/min): 18 Blood Pressure (mmHg): 110/65 Reference Range: 80 - 120 mg / dl Electronic Signature(s) Signed: 12/05/2022 1:05:22 PM By: [name] CHT EMT BS , , Entered By: [name] on 12/05/2022 13:05:22

## 2022-12-06 ENCOUNTER — Encounter (HOSPITAL_BASED_OUTPATIENT_CLINIC_OR_DEPARTMENT_OTHER): Payer: Medicare Other | Admitting: Internal Medicine

## 2022-12-06 DIAGNOSIS — H0270 Unspecified degenerative disorders of eyelid and periocular area: Secondary | ICD-10-CM

## 2022-12-06 DIAGNOSIS — T8131XD Disruption of external operation (surgical) wound, not elsewhere classified, subsequent encounter: Secondary | ICD-10-CM | POA: Diagnosis not present

## 2022-12-06 DIAGNOSIS — T86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: Secondary | ICD-10-CM | POA: Diagnosis not present

## 2022-12-06 NOTE — Progress Notes (Signed)
AVEN, CEGIELSKI ([id number]) [id number] Page 1 of 1 Visit Report for 12/06/2022 SuperBill Details Patient Name: Date of Service: [name], [name] 12/06/2022 Medical Record Number: [id number] Patient Account Number: [id number] Date of Birth/Sex: Treating RN: [birth year] (52 y.o. 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 Disruption of external operation (surgical) wound, not elsewhere classified, subsequent encounter 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

## 2022-12-06 NOTE — Progress Notes (Addendum)
[name] ([id number]) [id number] Page 1 of 2 Visit Report for 12/06/2022 HBO Details Patient Name: Date of Service: [name], [name] 12/06/2022 7:30 A M Medical Record Number: [id number] Patient Account Number: [id number] Date of Birth/Sex: Treating RN: [birth year] (52 y.o. [name] Primary Care : Janece Canterbury Other Clinician: Haywood Pao Referring : Treating /Extender: Luan Pulling in Treatment: 7 HBO Treatment Course Details Treatment Course Number: 1 Ordering : Geralyn Corwin T Treatments Ordered: otal 20 HBO Treatment Start 10/23/2022 Date: HBO Indication: Compromised/Failed Flap/Graft to Left Forehead HBO Treatment End 12/06/2022 Date: HBO Discharge Treatment Series Complete; Improved Wound Outcome: Perfusion HBO Treatment Details Treatment Number: 20 Patient Type: Outpatient Chamber Type: Monoplace Chamber Serial #: [id number] Treatment Protocol: 2.0 ATA with 90 minutes oxygen, with two 5 minute air breaks Treatment Details Compression Rate Down: 1.5 psi / minute De-Compression Rate Up: 1.5 psi / minute A breaks and breathing ir Compress Tx Pressure periods Decompress Decompress Begins Reached (leave unused spaces Begins Ends blank) Chamber Pressure (ATA 1 2 2 2 2 2  --2 1 ) Clock Time (24 hr) 08:03 08:15 08:45 08:50 09:20 09:25 - - 09:55 10:05 Treatment Length: 122 (minutes) Treatment Segments: 4 Vital Signs Capillary Blood Glucose Reference Range: 80 - 120 mg / dl HBO Diabetic Blood Glucose Intervention Range: <131 mg/dl or >272 mg/dl Time Vitals Blood Respiratory Capillary Blood Glucose Pulse Action Type: Pulse: Temperature: Taken: Pressure: Rate: Glucose (mg/dl): Meter #: Oximetry (%) Taken: Pre 07:53 109/72 59 16 98.1 Post 10:14 157/86 50 16 98.1 Treatment Response Treatment Toleration: Well Treatment Completion Status: Treatment Completed without  Adverse Event Treatment Notes [name] arrived with normal vital signs. She prepared for treatment. After performing a safety check, she was placed in the chamber which was compressed with 100% oxygen at a rate of 1.5 psi/min after confirming normal ear equalization. She tolerated treatment and subsequent decompression at approximately 2 psi/min. Her post-treatment heart rate was 50 bpm. She denied symptoms of bradycardia. She was stable upon discharge with her friend. Physician HBO Attestation: I certify that I supervised this HBO treatment in accordance with Medicare guidelines. A trained emergency response team is readily available per Yes hospital policies and procedures. Continue HBOT as ordered. Yes Electronic Signature(s) Signed: 12/11/2022 6:54:49 PM By: Haywood Pao CHT EMT BS , , Signed: 12/20/2022 10:14:37 AM By: Geralyn Corwin DO Previous Signature: 12/06/2022 3:41:47 PM Version By: Geralyn Corwin DO Previous Signature: 12/06/2022 1:25:54 PM Version By: Haywood Pao CHT EMT BS , , Previous Signature: 12/06/2022 3:19:04 PM Version By: Geralyn Corwin DO Entered By: Haywood Pao on 12/11/2022 18:54:49 [name] ([id number]) [id number] Page 2 of 2 -------------------------------------------------------------------------------- HBO Safety Checklist Details Patient Name: Date of Service: [name], [name] 12/06/2022 7:30 A M Medical Record Number: [id number] Patient Account Number: [id number] Date of Birth/Sex: Treating RN: October 21, 1970 (52 y.o. [name] Primary Care : Janece Canterbury Other Clinician: Haywood Pao Referring : Treating /Extender: Luan Pulling in Treatment: 7 HBO Safety Checklist Items Safety Checklist Consent Form Signed Patient voided / foley secured and emptied When did you last eato Breakfast Last dose of injectable or oral agent n/a Ostomy pouch  emptied and vented if applicable NA All implantable devices assessed, documented and approved NA Intravenous access site secured and place NA Valuables secured Linens and cotton and cotton/polyester blend (less than 51% polyester) Personal oil-based products / skin lotions / body lotions removed Wigs or hairpieces removed NA Smoking or  tobacco materials removed NA Books / newspapers / magazines / loose paper removed Cologne, aftershave, perfume and deodorant removed Jewelry removed (may wrap wedding band) Make-up removed Hair care products removed Battery operated devices (external) removed Heating patches and chemical warmers removed Titanium eyewear removed Nail polish cured greater than 10 hours NA Casting material cured greater than 10 hours NA Hearing aids removed NA Loose dentures or partials removed NA Prosthetics have been removed NA Patient demonstrates correct use of air break device (if applicable) Patient concerns have been addressed Patient grounding bracelet on and cord attached to chamber Specifics for Inpatients (complete in addition to above) Medication sheet sent with patient NA Intravenous medications needed or due during therapy sent with patient NA Drainage tubes (e.g. nasogastric tube or chest tube secured and vented) NA Endotracheal or Tracheotomy tube secured NA Cuff deflated of air and inflated with saline NA Airway suctioned NA Notes Paper version used prior to treatment start. Electronic Signature(s) Signed: 12/06/2022 1:24:02 PM By: Haywood Pao CHT EMT BS , , Entered By: Haywood Pao on 12/06/2022 13:24:02

## 2022-12-06 NOTE — Progress Notes (Addendum)
[name], [name] ([id number]) [id number] Page 1 of 2 Visit Report for 12/06/2022 Arrival Information Details Patient Name: Date of Service: [name], [name] 12/06/2022 7:30 A M Medical Record Number: [id number] Patient Account Number: [id number] Date of Birth/Sex: Treating RN: 08-22-1970 (52 y.o. Tommye Standard Primary Care Ferry Matthis: Janece Canterbury Other Clinician: Haywood Pao Referring Leiam Hopwood: Treating Nazar Kuan/Extender: Luan Pulling in Treatment: 7 Visit Information History Since Last Visit All ordered tests and consults were completed: Yes Patient Arrived: Ambulatory Added or deleted any medications: No Arrival Time: 07:24 Any new allergies or adverse reactions: No Accompanied By: self Had a fall or experienced change in No Transfer Assistance: None activities of daily living that may affect Patient Identification Verified: Yes risk of falls: Secondary Verification Process Completed: Yes Signs or symptoms of abuse/neglect since last visito No Patient Requires Transmission-Based Precautions: No Hospitalized since last visit: No Patient Has Alerts: No Implantable device outside of the clinic excluding No cellular tissue based products placed in the center since last visit: Pain Present Now: No Electronic Signature(s) Signed: 12/06/2022 1:22:18 PM By: Haywood Pao CHT EMT BS , , Entered By: Haywood Pao on 12/06/2022 13:22:18 -------------------------------------------------------------------------------- Encounter Discharge Information Details Patient Name: Date of Service: [name] 12/06/2022 7:30 A M Medical Record Number: [id number] Patient Account Number: [id number] Date of Birth/Sex: Treating RN: [birth year] (52 y.o. Tommye Standard Primary Care Mliss Wedin: Janece Canterbury Other Clinician: Haywood Pao Referring Lateya Dauria: Treating Mearle Drew/Extender: Luan Pulling in Treatment: 7 Encounter Discharge Information Items Discharge Condition: Stable Ambulatory Status: Ambulatory Discharge Destination: Home Transportation: Private Auto Accompanied By: friend Schedule Follow-up Appointment: No Clinical Summary of Care: Electronic Signature(s) Signed: 12/06/2022 1:27:56 PM By: Haywood Pao CHT EMT BS , , Entered By: Haywood Pao on 12/06/2022 13:27:56 [name] ([id number]) [id number] Page 2 of 2 -------------------------------------------------------------------------------- Multi-Disciplinary Care Plan Details Patient Name: Date of Service: [name], [name] 12/06/2022 7:30 A M Medical Record Number: [id number] Patient Account Number: [id number] Date of Birth/Sex: Treating RN: [birth year] (52 y.o. Arta Silence Primary Care Maleik Vanderzee: Janece Canterbury Other Clinician: Haywood Pao Referring Saleem Coccia: Treating Ranveer Wahlstrom/Extender: Luan Pulling in Treatment: 7 Active Inactive Electronic Signature(s) Signed: 12/12/2022 5:15:20 PM By: Shawn Stall RN, BSN Entered By: Shawn Stall on 12/12/2022 08:33:14 -------------------------------------------------------------------------------- Vitals Details Patient Name: Date of Service: [name], [name] 12/06/2022 7:30 A M Medical Record Number: [id number] Patient Account Number: [id number] Date of Birth/Sex: Treating RN: 1971/03/20 (52 y.o. Tommye Standard Primary Care Yiselle Babich: Janece Canterbury Other Clinician: Haywood Pao Referring Catrell Morrone: Treating Barbara Ahart/Extender: Luan Pulling in Treatment: 7 Vital Signs Time Taken: 07:53 Temperature (F): 98.1 Height (in): 65 Pulse (bpm): 59 Respiratory Rate (breaths/min): 16 Blood Pressure (mmHg): 109/72 Reference Range: 80 - 120 mg / dl Electronic Signature(s) Signed: 12/06/2022 1:22:41 PM By: Haywood Pao CHT EMT BS , , Entered By:  Haywood Pao on 12/06/2022 13:22:41

## 2023-01-29 ENCOUNTER — Encounter: Payer: Self-pay | Admitting: Diagnostic Neuroimaging

## 2023-01-30 MED ORDER — PREDNISONE 10 MG PO TABS: ORAL_TABLET | ORAL | 0 refills | Status: DC

## 2023-01-30 NOTE — Telephone Encounter (Signed)
Meds ordered this encounter  Medications   predniSONE (DELTASONE) 10 MG tablet    Sig: Take 60mg  on day 1. Reduce by 10mg  each subsequent day. (60, 50, 40, 30, 20, 10, stop)    Dispense:  21 tablet    Refill:  0   Suanne Marker, MD 01/30/2023, 1:37 PM Certified in Neurology, Neurophysiology and Neuroimaging  [facility] [address] [place], [place] [address] [phone]

## 2023-01-30 NOTE — Addendum Note (Signed)
Addended by: Joycelyn Schmid R on: 01/30/2023 01:37 PM   Modules accepted: Orders

## 2023-02-04 ENCOUNTER — Other Ambulatory Visit: Payer: Self-pay | Admitting: Anesthesiology

## 2023-02-04 MED ORDER — KESIMPTA 20 MG/0.4ML ~~LOC~~ SOAJ: 20.0000 mg | SUBCUTANEOUS | 0 refills | Status: DC

## 2023-02-11 ENCOUNTER — Ambulatory Visit: Payer: Medicare Other | Admitting: Diagnostic Neuroimaging

## 2023-02-17 ENCOUNTER — Encounter: Payer: Self-pay | Admitting: Diagnostic Neuroimaging

## 2023-02-19 ENCOUNTER — Telehealth: Payer: Self-pay | Admitting: Diagnostic Neuroimaging

## 2023-02-19 NOTE — Telephone Encounter (Signed)
Pt calling checking on if Dr.  Marjory Lies has responded to message pt sent by MyChart. Pt said had an MRI last Thursday that was ordered by pain physician. Would like Dr. Marjory Lies to review the MRI results. Because look like there are new lesions in there. Pt asking please respond through MyChart

## 2023-02-19 NOTE — Telephone Encounter (Signed)
I have routed the mychart message to the MD. He will review

## 2023-03-11 NOTE — Telephone Encounter (Signed)
I called patient. I reviewed scans. No definite change in MRI brain. 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 Follow up with pain mgmt and spine surgery for neck issue. Continue kesimpta.   Suanne Marker, MD 03/11/2023, 5:49 PM Certified in Neurology, Neurophysiology and Neuroimaging  [facility] [address], [address] [place], [place] [address] [phone]

## 2023-03-26 ENCOUNTER — Ambulatory Visit (INDEPENDENT_AMBULATORY_CARE_PROVIDER_SITE_OTHER): Payer: Medicare Other | Admitting: Diagnostic Neuroimaging

## 2023-03-26 ENCOUNTER — Encounter: Payer: Self-pay | Admitting: Diagnostic Neuroimaging

## 2023-03-26 VITALS — BP 129/79 | HR 77 | Ht 67.0 in

## 2023-03-26 DIAGNOSIS — G35 Multiple sclerosis: Secondary | ICD-10-CM
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
## 2023-05-20 ENCOUNTER — Other Ambulatory Visit: Payer: Self-pay | Admitting: Diagnostic Neuroimaging

## 2023-07-22 ENCOUNTER — Other Ambulatory Visit: Payer: Self-pay | Admitting: Diagnostic Neuroimaging

## 2023-07-22 NOTE — Telephone Encounter (Signed)
 Last seen on 03/26/23 Follow up scheduled on 09/30/23

## 2023-08-27 ENCOUNTER — Other Ambulatory Visit (HOSPITAL_COMMUNITY): Payer: Self-pay

## 2023-08-27 ENCOUNTER — Telehealth: Payer: Self-pay

## 2023-08-27 NOTE — Telephone Encounter (Signed)
 Pharmacy Patient Advocate Encounter  Received notification from [employer] that Prior Authorization for Kesimpta 20MG /0.4ML auto-injectors has been APPROVED from 08/27/2023 to 08/26/2024. Unable to obtain price due to refill too soon rejection, last fill date 08/19/2023 next available fill date5/11/2023   PA #/Case ID/Reference #: [id number]

## 2023-08-27 NOTE — Telephone Encounter (Signed)
 Pharmacy Patient Advocate Encounter   Received notification from CoverMyMeds that prior authorization for Kesimpta 20MG /0.4ML auto-injectors is required/requested.   Insurance verification completed.   The patient is insured through [facility] .   Per test claim: PA required; PA submitted to above mentioned insurance via CoverMyMeds Key/confirmation #/EOC [id number] Status is pending

## 2023-09-30 ENCOUNTER — Encounter: Payer: Self-pay | Admitting: Diagnostic Neuroimaging

## 2023-09-30 ENCOUNTER — Ambulatory Visit (INDEPENDENT_AMBULATORY_CARE_PROVIDER_SITE_OTHER): Payer: Medicare Other | Admitting: Diagnostic Neuroimaging

## 2023-09-30 ENCOUNTER — Other Ambulatory Visit: Payer: Self-pay | Admitting: Neurology

## 2023-09-30 VITALS — BP 113/69 | HR 77 | Ht 66.0 in | Wt 160.0 lb

## 2023-09-30 DIAGNOSIS — G35 Multiple sclerosis: Secondary | ICD-10-CM | POA: Diagnosis not present

## 2023-09-30 MED ORDER — KESIMPTA 20 MG/0.4ML ~~LOC~~ SOAJ: 20.0000 mg | SUBCUTANEOUS | 12 refills | Status: AC

## 2023-09-30 NOTE — Progress Notes (Signed)
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 plaque associated with multiple sclerosis. None of the foci appears to be acute. When compared to the MRI dated 11/30/2015, there is no interval change. 2.    Small benign-appearing pineal cyst.   It is unchanged. 3.    Mild left maxillary chronic sinusitis, also unchanged. 4.    There are no acute findings and there is a normal enhancement pattern.  09/05/17 MRI thoracic spine: [I reviewed images myself and agree with interpretation. -VRP]  1. 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 Small LEFT pleural effusion and trace suspected empyema. Findings would be better demonstrated on CT chest with contrast. 2. LEFT paraspinal myositis. 3. Mild symmetric T4-5 facet edema favoring inflammatory changes, less likely infection/septic arthropathy.   09/05/17 MRI lumbar spine: [I reviewed images myself and agree with interpretation. -VRP]   1. 1.1 x 4.5 x 7.9 cm LEFT superficial paraspinal abscess. 2. No discitis,  osteomyelitis or epidural abscess. 3. Stable degenerative change of the lumbar spine. No canal stenosis. Mild LEFT L5-S1 neural foraminal narrowing.  12/10/17 MRI thoracic spine [I reviewed images myself and agree with interpretation. -VRP]  1. Interval resolution of left ninth rib osteomyelitis with residual healed left posterior ninth rib fracture. Previously seen soft tissue abscess and probable left empyema within this region have resolved. 2. No new evidence for infection within the thoracic spine. 3. Mild edema about the T4-5 facets, favored to be degenerative/inflammatory in nature, similar to previous.  11/22/19 MRI brain without contrast demonstrating: - Stable round and ovoid periventricular and subcortical chronic demyelinating plaques. - No change from 01/25/18.   03/15/21 MRI brain MRI scan of the brain with and without contrast showing stable appearance of bilateral periventricular and subcortical white matter hyperintensities in the pattern and distribution compatible with chronic demyelinating disease.  No acute or enhancing lesions are noted.  Small benign pineal region cyst is noted incidentally.  Overall no significant change compared with previous MRI dated 11/22/2019.  03/27/22 MRI brain (without) demonstrating: - Few stable, round and ovoid periventricular, pericallosal and subcortical chronic demyelinating plaques. - No change from 03/15/2021.  02/13/23 MRI brain [I reviewed images myself and agree with interpretation. No change from 2023. -VRP]  1. Bilateral white matter lesions as above, consistent with patient's given diagnosis of multiple sclerosis. No  evidence of diffusion restriction to suggest active demyelination.  2. No acute ischemia.   02/13/23 MRI cervical -C6-7: Mild-to-moderate DDD. Moderate-sized left-sided disc extrusion (age-indeterminate) with mild caudal migration and also partial extension into the medial left neural foramen. It indents the left ventral  thecal sac and the left ventrolateral spinal cord. Moderate left-sided spinal canal stenosis. Moderate left neural foraminal stenosis. Mild right-sided spinal canal stenosis. Mild right neural foraminal stenosis.  -Degenerative spondylosis at other levels, including up to moderately severe right neural foraminal stenosis at C3-4.     ASSESSMENT AND PLAN  53 y.o. 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 Was on tysabri  for 2 months, but also with unexplained skin lesions/wounds (possibly from picking/anxiety per dermatology note), increased psychosocial stressors, living between [facility] and [address], and then came off tysabri . Now back in [place], and then on plegridy (Jan 2017 - April 2017). Now switched to ocrevus  (started 02/06/16 and 02/20/16).    Dx:  Multiple sclerosis (HCC) - Plan: CBC with Differential/Platelet, Immunoglobulins, QN, A/E/G/M    PLAN:  MULTIPLE SCLEROSIS (relapsing, remitting; stable on ocrevus ; some wearing off) - continue kesimpta  (due to benefit wearing off ~1-2 months before ocrevus  infusions) - check MRI brain, CBC, immunoglobulins annually  RIGHT ANKLE / KNEE INJURY (Oct 2024) - healed; monitor  LEG AND BACK PAIN / MUSCLE SPASMS (worsened after accident in Dec 2019) - continue pain mgmt per Dr. Helaine Llanos - continue lyrica , baclofen , tizanidine , dilaudid  for muscle spasms and nerve pain - caution with driving  DEPRESSION / ANXIETY - stable overall; mood disorder / depression / anxiety--> managed by PCP and Dr. Charon Copper (psychiatry)  URINARY INCONTINENCE - follow up with urology (previously tried oxybutynin  for overactive bladder)  Meds ordered this encounter  Medications   Ofatumumab  (KESIMPTA ) 20  MG/0.4ML SOAJ    Sig: Inject 20 mg into the skin every 30 (thirty) days.    Dispense:  0.4 mL    Refill:  12   Orders Placed This Encounter  Procedures   CBC with Differential/Platelet   Immunoglobulins, QN, A/E/G/M   Return in about 8 months (around 06/01/2024).    Omega Bible, MD 09/30/2023, 3:55 PM Certified in Neurology, Neurophysiology and Neuroimaging  [name] [address], [address] [place], [place] [address] [phone]

## 2023-10-02 LAB — CBC WITH DIFFERENTIAL/PLATELET
Basophils Absolute: 0.1 10*3/uL (ref 0.0–0.2)
Basos: 1 %
EOS (ABSOLUTE): 0.2 10*3/uL (ref 0.0–0.4)
Eos: 2 %
Hematocrit: 41.4 % (ref 34.0–46.6)
Hemoglobin: 14 g/dL (ref 11.1–15.9)
Immature Grans (Abs): 0 10*3/uL (ref 0.0–0.1)
Immature Granulocytes: 0 %
Lymphocytes Absolute: 1.9 10*3/uL (ref 0.7–3.1)
Lymphs: 14 %
MCH: 33.2 pg — ABNORMAL HIGH (ref 26.6–33.0)
MCHC: 33.8 g/dL (ref 31.5–35.7)
MCV: 98 fL — ABNORMAL HIGH (ref 79–97)
Monocytes Absolute: 1.4 10*3/uL — ABNORMAL HIGH (ref 0.1–0.9)
Monocytes: 10 %
Neutrophils Absolute: 10.2 10*3/uL — ABNORMAL HIGH (ref 1.4–7.0)
Neutrophils: 73 %
Platelets: 206 10*3/uL (ref 150–450)
RBC: 4.22 x10E6/uL (ref 3.77–5.28)
RDW: 14 % (ref 11.7–15.4)
WBC: 13.9 10*3/uL — ABNORMAL HIGH (ref 3.4–10.8)

## 2023-10-02 LAB — IMMUNOGLOBULINS A/E/G/M, SERUM
IgA/Immunoglobulin A, Serum: 82 mg/dL — ABNORMAL LOW (ref 87–352)
IgE (Immunoglobulin E), Serum: 6 [IU]/mL (ref 6–495)
IgG (Immunoglobin G), Serum: 981 mg/dL (ref 586–1602)
IgM (Immunoglobulin M), Srm: 89 mg/dL (ref 26–217)

## 2023-10-16 ENCOUNTER — Ambulatory Visit: Payer: Self-pay | Admitting: Diagnostic Neuroimaging

## 2024-06-01 ENCOUNTER — Encounter: Payer: Self-pay | Admitting: Diagnostic Neuroimaging

## 2024-06-01 ENCOUNTER — Ambulatory Visit: Admitting: Diagnostic Neuroimaging

## 2024-06-01 VITALS — BP 112/70 | HR 89 | Ht 66.0 in | Wt 150.0 lb

## 2024-06-01 DIAGNOSIS — G35D Multiple sclerosis, unspecified: Secondary | ICD-10-CM

## 2024-06-01 NOTE — Progress Notes (Signed)
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 - normal; no change from MRI on 09/29/12  11/30/15 MRI brain with and without contrast shows the following: 1.   There are some T2/FLAIR hyperintense foci in the periventricular, juxtacortical and deep white matter of the hemispheres consistent with chronic demyelinating plaque associated with multiple sclerosis. None of the foci appears to be acute. When compared to the study dated 04/26/2015, there are no new lesions. An acute focus on the prior MRI no longer enhances on the current MRI. 2.   Mild chronic left maxillary sinusitis. 3.   There are no acute findings  11/30/15 MRI cervical spine with and without contrast shows the following: 1.   Mild disc degenerative changes at C3-C4, C5-C6 and C6-C7 that did not lead to any nerve root impingement. 2.   The spinal cord appears normal before and after contrast 3.   There are no acute findings.    There are no changes when compared to the MRI dated  04/26/2015.  08/22/15 Labs: Hep Surface antigen - neg; Hep B core ab - neg; Hep B surface ab - negative.  12/15/16 MRI brain 1.   T2/FLAIR hyperintense foci in the periventricular, juxtacortical and deep white matter of both hemispheres in a pattern and configuration consistent with chronic demyelinating plaque associated with multiple sclerosis. None of the foci appears to be acute. When compared to the MRI dated 11/30/2015, there is no interval change. 2.    Small benign-appearing pineal cyst.   It is unchanged. 3.    Mild left maxillary chronic sinusitis, also unchanged. 4.    There are no acute findings and there is a normal enhancement pattern.  09/05/17 MRI thoracic spine: [I reviewed images myself and agree with interpretation. -VRP]  1. 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 Small LEFT pleural effusion and trace suspected empyema. Findings would be better demonstrated on CT chest with contrast. 2. LEFT paraspinal myositis. 3. Mild symmetric T4-5 facet edema favoring inflammatory changes,  less likely infection/septic arthropathy.   09/05/17 MRI lumbar spine: [I reviewed images myself and agree with interpretation. -VRP]   1. 1.1 x 4.5 x 7.9 cm LEFT superficial paraspinal abscess. 2. No discitis, osteomyelitis or epidural abscess. 3. Stable degenerative change of the lumbar spine. No canal stenosis. Mild LEFT L5-S1 neural foraminal narrowing.  12/10/17 MRI thoracic spine [I reviewed images myself and agree with interpretation. -VRP]  1. Interval resolution of left ninth rib osteomyelitis with residual healed left posterior ninth rib fracture. Previously seen soft tissue abscess and probable left empyema within this region have resolved. 2. No new evidence for infection within the thoracic spine. 3. Mild edema about the T4-5 facets, favored to be degenerative/inflammatory in nature, similar to previous.  11/22/19 MRI brain without  contrast demonstrating: - Stable round and ovoid periventricular and subcortical chronic demyelinating plaques. - No change from 01/25/18.   03/15/21 MRI brain MRI scan of the brain with and without contrast showing stable appearance of bilateral periventricular and subcortical white matter hyperintensities in the pattern and distribution compatible with chronic demyelinating disease.  No acute or enhancing lesions are noted.  Small benign pineal region cyst is noted incidentally.  Overall no significant change compared with previous MRI dated 11/22/2019.  03/27/22 MRI brain (without) demonstrating: - Few stable, round and ovoid periventricular, pericallosal and subcortical chronic demyelinating plaques. - No change from 03/15/2021.  02/13/23 MRI brain [I reviewed images myself and agree with interpretation. No change from 2023. -VRP]  1. Bilateral white matter lesions as above, consistent with patient's given diagnosis of multiple sclerosis. No evidence of diffusion restriction to suggest active demyelination.  2. No acute ischemia.   02/13/23 MRI cervical -C6-7: Mild-to-moderate DDD. Moderate-sized left-sided disc extrusion (age-indeterminate) with mild caudal migration and also partial extension into the medial left neural foramen. It indents the left ventral thecal sac and the left ventrolateral spinal cord. Moderate left-sided spinal canal stenosis. Moderate left neural foraminal stenosis. Mild right-sided spinal canal stenosis. Mild right neural foraminal stenosis.  -Degenerative spondylosis at other levels, including up to moderately severe right neural foraminal stenosis at C3-4.     ASSESSMENT AND PLAN  54 y.o. 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 Was on tysabri  for 2 months, but also with unexplained skin lesions/wounds (possibly from picking/anxiety per dermatology note), increased psychosocial stressors, living between [facility] and [place], and then came off tysabri . Now back in [place], and then on plegridy (Jan 2017 - April 2017). Then switched to ocrevus  (started 02/06/16 and 02/20/16). Now on kesimpta  (since 2024).    Dx:  Multiple sclerosis - Plan: MR BRAIN WO CONTRAST, Immunoglobulins, QN, A/E/G/M, CBC with Differential/Platelet    PLAN:  MULTIPLE SCLEROSIS (relapsing, remitting; stable on ocrevus ; some wearing off) - continue kesimpta  (due to benefit wearing off ~1-2 months before ocrevus  infusions) - check MRI brain, CBC, immunoglobulins annually  RIGHT ANKLE / KNEE INJURY (Oct 2024) - healed; monitor  LEG AND BACK PAIN / MUSCLE SPASMS (worsened after accident in Dec 2019) - continue pain mgmt per Dr. Cornelius - continue lyrica , baclofen , tizanidine , dilaudid  for muscle spasms and nerve pain - caution with driving  DEPRESSION / ANXIETY - stable overall; mood disorder / depression / anxiety--> managed by PCP and Dr. Layman (psychiatry)  URINARY INCONTINENCE - follow up with urology (previously tried oxybutynin  for overactive bladder)  Orders Placed This Encounter  Procedures   MR BRAIN WO CONTRAST   Immunoglobulins, QN, A/E/G/M   CBC with Differential/Platelet   Return in about 8 months (around 01/30/2025).    EDUARD FABIENE HANLON, MD 06/01/2024, 4:41 PM Certified in Neurology, Neurophysiology and Neuroimaging  [facility] [address] [place], [place] [address] [phone]

## 2024-06-02 ENCOUNTER — Telehealth: Payer: Self-pay | Admitting: Diagnostic Neuroimaging

## 2024-06-02 NOTE — Telephone Encounter (Signed)
MRI order sent to [place] [phone]

## 2024-06-04 ENCOUNTER — Encounter: Payer: Self-pay | Admitting: Diagnostic Neuroimaging

## 2024-06-04 LAB — CBC WITH DIFFERENTIAL/PLATELET
Basophils Absolute: 0.1 x10E3/uL (ref 0.0–0.2)
Basos: 1 %
EOS (ABSOLUTE): 0.6 x10E3/uL — ABNORMAL HIGH (ref 0.0–0.4)
Eos: 6 %
Hematocrit: 44.5 % (ref 34.0–46.6)
Hemoglobin: 15.2 g/dL (ref 11.1–15.9)
Immature Grans (Abs): 0 x10E3/uL (ref 0.0–0.1)
Immature Granulocytes: 0 %
Lymphocytes Absolute: 2.2 x10E3/uL (ref 0.7–3.1)
Lymphs: 22 %
MCH: 34.1 pg — ABNORMAL HIGH (ref 26.6–33.0)
MCHC: 34.2 g/dL (ref 31.5–35.7)
MCV: 100 fL — ABNORMAL HIGH (ref 79–97)
Monocytes Absolute: 1 x10E3/uL — ABNORMAL HIGH (ref 0.1–0.9)
Monocytes: 11 %
Neutrophils Absolute: 5.9 x10E3/uL (ref 1.4–7.0)
Neutrophils: 60 %
Platelets: 210 x10E3/uL (ref 150–450)
RBC: 4.46 x10E6/uL (ref 3.77–5.28)
RDW: 11.3 % — ABNORMAL LOW (ref 11.7–15.4)
WBC: 9.8 x10E3/uL (ref 3.4–10.8)

## 2024-06-04 LAB — IMMUNOGLOBULINS A/E/G/M, SERUM
IgE (Immunoglobulin E), Serum: 7 [IU]/mL (ref 6–495)
IgG (Immunoglobin G), Serum: 1085 mg/dL (ref 586–1602)
IgM (Immunoglobulin M), Srm: 94 mg/dL (ref 26–217)
Immunoglobulin A, (IgA) QN, Serum: 85 mg/dL — ABNORMAL LOW (ref 87–352)

## 2024-06-04 NOTE — Telephone Encounter (Signed)
Please review patient's labs 

## 2024-06-09 ENCOUNTER — Ambulatory Visit: Payer: Self-pay | Admitting: Diagnostic Neuroimaging

## 2024-06-12 ENCOUNTER — Ambulatory Visit
Admission: RE | Admit: 2024-06-12 | Discharge: 2024-06-12 | Disposition: A | Source: Ambulatory Visit | Attending: Diagnostic Neuroimaging | Admitting: Diagnostic Neuroimaging

## 2024-06-12 DIAGNOSIS — G35D Multiple sclerosis, unspecified: Secondary | ICD-10-CM

## 2025-01-26 ENCOUNTER — Ambulatory Visit: Admitting: Diagnostic Neuroimaging
